# Patient Record
Sex: Female | Born: 1950 | Race: White | Hispanic: No | Smoking: Never smoker
Health system: Southern US, Community
[De-identification: ages and names within clinical notes are randomized; demographics above are authoritative.]

## PROBLEM LIST (undated history)

## (undated) ENCOUNTER — Ambulatory Visit

## (undated) DIAGNOSIS — R600 Localized edema: Secondary | ICD-10-CM

## (undated) DIAGNOSIS — G4730 Sleep apnea, unspecified: Secondary | ICD-10-CM

## (undated) DIAGNOSIS — M545 Low back pain, unspecified: Secondary | ICD-10-CM

## (undated) DIAGNOSIS — M25559 Pain in unspecified hip: Secondary | ICD-10-CM

## (undated) DIAGNOSIS — K589 Irritable bowel syndrome without diarrhea: Secondary | ICD-10-CM

## (undated) DIAGNOSIS — E538 Deficiency of other specified B group vitamins: Secondary | ICD-10-CM

## (undated) DIAGNOSIS — E039 Hypothyroidism, unspecified: Secondary | ICD-10-CM

## (undated) DIAGNOSIS — Z91018 Allergy to other foods: Secondary | ICD-10-CM

## (undated) DIAGNOSIS — M2550 Pain in unspecified joint: Secondary | ICD-10-CM

## (undated) DIAGNOSIS — E079 Disorder of thyroid, unspecified: Secondary | ICD-10-CM

## (undated) DIAGNOSIS — M549 Dorsalgia, unspecified: Secondary | ICD-10-CM

## (undated) DIAGNOSIS — R7303 Prediabetes: Secondary | ICD-10-CM

## (undated) DIAGNOSIS — I10 Essential (primary) hypertension: Secondary | ICD-10-CM

## (undated) DIAGNOSIS — M1990 Unspecified osteoarthritis, unspecified site: Secondary | ICD-10-CM

## (undated) DIAGNOSIS — M542 Cervicalgia: Secondary | ICD-10-CM

## (undated) HISTORY — DX: Dorsalgia, unspecified: M54.9

## (undated) HISTORY — DX: Allergy to other foods: Z91.018

## (undated) HISTORY — PX: OVARIAN CYST REMOVAL: SHX89

## (undated) HISTORY — DX: Cervicalgia: M54.2

## (undated) HISTORY — DX: Hypothyroidism, unspecified: E03.9

## (undated) HISTORY — DX: Unspecified osteoarthritis, unspecified site: M19.90

## (undated) HISTORY — DX: Low back pain, unspecified: M54.50

## (undated) HISTORY — DX: Essential (primary) hypertension: I10

## (undated) HISTORY — PX: ABDOMINAL HYSTERECTOMY: SHX81

## (undated) HISTORY — DX: Pain in unspecified joint: M25.50

## (undated) HISTORY — DX: Prediabetes: R73.03

## (undated) HISTORY — DX: Deficiency of other specified B group vitamins: E53.8

## (undated) HISTORY — DX: Sleep apnea, unspecified: G47.30

## (undated) HISTORY — DX: Localized edema: R60.0

## (undated) HISTORY — DX: Disorder of thyroid, unspecified: E07.9

## (undated) HISTORY — DX: Irritable bowel syndrome, unspecified: K58.9

## (undated) HISTORY — DX: Pain in unspecified hip: M25.559

## (undated) HISTORY — PX: TONSILLECTOMY: SHX5217

---

## 1999-03-16 ENCOUNTER — Other Ambulatory Visit: Admission: RE | Admit: 1999-03-16 | Discharge: 1999-03-16 | Payer: Self-pay | Admitting: *Deleted

## 2000-04-25 ENCOUNTER — Ambulatory Visit (HOSPITAL_COMMUNITY): Admission: RE | Admit: 2000-04-25 | Discharge: 2000-04-25 | Payer: Self-pay | Admitting: *Deleted

## 2000-04-25 ENCOUNTER — Encounter: Payer: Self-pay | Admitting: *Deleted

## 2000-04-29 ENCOUNTER — Other Ambulatory Visit: Admission: RE | Admit: 2000-04-29 | Discharge: 2000-04-29 | Payer: Self-pay | Admitting: *Deleted

## 2001-04-28 ENCOUNTER — Other Ambulatory Visit: Admission: RE | Admit: 2001-04-28 | Discharge: 2001-04-28 | Payer: Self-pay | Admitting: *Deleted

## 2002-06-22 ENCOUNTER — Other Ambulatory Visit: Admission: RE | Admit: 2002-06-22 | Discharge: 2002-06-22 | Payer: Self-pay | Admitting: *Deleted

## 2003-07-13 ENCOUNTER — Other Ambulatory Visit: Admission: RE | Admit: 2003-07-13 | Discharge: 2003-07-13 | Payer: Self-pay | Admitting: *Deleted

## 2003-07-14 ENCOUNTER — Encounter: Payer: Self-pay | Admitting: *Deleted

## 2003-07-14 ENCOUNTER — Ambulatory Visit (HOSPITAL_COMMUNITY): Admission: RE | Admit: 2003-07-14 | Discharge: 2003-07-14 | Payer: Self-pay | Admitting: *Deleted

## 2003-11-07 ENCOUNTER — Encounter: Payer: Self-pay | Admitting: Emergency Medicine

## 2003-11-08 ENCOUNTER — Inpatient Hospital Stay (HOSPITAL_COMMUNITY): Admission: EM | Admit: 2003-11-08 | Discharge: 2003-11-09 | Payer: Self-pay | Admitting: Emergency Medicine

## 2003-11-09 ENCOUNTER — Encounter: Payer: Self-pay | Admitting: Internal Medicine

## 2004-07-19 ENCOUNTER — Other Ambulatory Visit: Admission: RE | Admit: 2004-07-19 | Discharge: 2004-07-19 | Payer: Self-pay | Admitting: *Deleted

## 2004-07-25 ENCOUNTER — Ambulatory Visit (HOSPITAL_COMMUNITY): Admission: RE | Admit: 2004-07-25 | Discharge: 2004-07-25 | Payer: Self-pay | Admitting: *Deleted

## 2005-08-06 ENCOUNTER — Other Ambulatory Visit: Admission: RE | Admit: 2005-08-06 | Discharge: 2005-08-06 | Payer: Self-pay | Admitting: *Deleted

## 2005-08-14 ENCOUNTER — Ambulatory Visit (HOSPITAL_COMMUNITY): Admission: RE | Admit: 2005-08-14 | Discharge: 2005-08-14 | Payer: Self-pay | Admitting: Family Medicine

## 2006-01-22 ENCOUNTER — Ambulatory Visit (HOSPITAL_COMMUNITY): Admission: RE | Admit: 2006-01-22 | Discharge: 2006-01-22 | Payer: Self-pay | Admitting: Family Medicine

## 2006-02-26 ENCOUNTER — Ambulatory Visit: Payer: Self-pay | Admitting: Cardiology

## 2006-03-01 ENCOUNTER — Ambulatory Visit: Payer: Self-pay | Admitting: Infectious Diseases

## 2006-03-14 ENCOUNTER — Ambulatory Visit: Payer: Self-pay

## 2006-03-18 ENCOUNTER — Ambulatory Visit: Payer: Self-pay | Admitting: Infectious Diseases

## 2006-04-02 ENCOUNTER — Ambulatory Visit: Payer: Self-pay | Admitting: Cardiology

## 2006-05-01 ENCOUNTER — Ambulatory Visit: Payer: Self-pay | Admitting: Cardiology

## 2006-11-06 ENCOUNTER — Other Ambulatory Visit: Admission: RE | Admit: 2006-11-06 | Discharge: 2006-11-06 | Payer: Self-pay | Admitting: *Deleted

## 2007-01-07 ENCOUNTER — Ambulatory Visit: Payer: Self-pay | Admitting: Cardiology

## 2008-02-24 ENCOUNTER — Other Ambulatory Visit: Admission: RE | Admit: 2008-02-24 | Discharge: 2008-02-24 | Payer: Self-pay | Admitting: *Deleted

## 2008-11-10 ENCOUNTER — Emergency Department (HOSPITAL_COMMUNITY): Admission: EM | Admit: 2008-11-10 | Discharge: 2008-11-11 | Payer: Self-pay | Admitting: Emergency Medicine

## 2009-07-05 ENCOUNTER — Telehealth: Payer: Self-pay | Admitting: Gastroenterology

## 2009-11-19 LAB — HM DEXA SCAN

## 2010-02-27 LAB — HM MAMMOGRAPHY

## 2010-05-06 ENCOUNTER — Ambulatory Visit: Payer: Self-pay | Admitting: Internal Medicine

## 2010-05-06 DIAGNOSIS — M009 Pyogenic arthritis, unspecified: Secondary | ICD-10-CM | POA: Insufficient documentation

## 2010-05-07 ENCOUNTER — Inpatient Hospital Stay (HOSPITAL_COMMUNITY): Admission: EM | Admit: 2010-05-07 | Discharge: 2010-05-14 | Payer: Self-pay | Admitting: Emergency Medicine

## 2010-05-07 ENCOUNTER — Ambulatory Visit: Payer: Self-pay | Admitting: Cardiology

## 2010-05-08 ENCOUNTER — Encounter (INDEPENDENT_AMBULATORY_CARE_PROVIDER_SITE_OTHER): Payer: Self-pay | Admitting: Internal Medicine

## 2010-05-08 ENCOUNTER — Ambulatory Visit: Payer: Self-pay | Admitting: Infectious Diseases

## 2010-05-22 ENCOUNTER — Encounter: Payer: Self-pay | Admitting: Infectious Diseases

## 2010-05-24 ENCOUNTER — Encounter: Payer: Self-pay | Admitting: Infectious Diseases

## 2010-06-01 ENCOUNTER — Telehealth: Payer: Self-pay | Admitting: Internal Medicine

## 2010-06-05 ENCOUNTER — Encounter: Payer: Self-pay | Admitting: Internal Medicine

## 2010-06-05 ENCOUNTER — Encounter: Payer: Self-pay | Admitting: Infectious Diseases

## 2010-06-12 ENCOUNTER — Ambulatory Visit: Payer: Self-pay | Admitting: Infectious Diseases

## 2010-06-12 DIAGNOSIS — E039 Hypothyroidism, unspecified: Secondary | ICD-10-CM | POA: Insufficient documentation

## 2010-06-21 ENCOUNTER — Encounter: Admission: RE | Admit: 2010-06-21 | Discharge: 2010-09-19 | Payer: Self-pay | Admitting: Family Medicine

## 2010-07-25 LAB — HEMOGLOBIN A1C: Hgb A1c MFr Bld: 5.6 % (ref 4.0–6.0)

## 2010-12-10 ENCOUNTER — Encounter: Payer: Self-pay | Admitting: Family Medicine

## 2010-12-21 NOTE — Progress Notes (Signed)
Summary: IV ABX last dose 06/04/2010, Pull PICC 06/05/2010, HSFU 7/25  Phone Note Call from Patient Call back at Home Phone [phone]   Caller: Patient Call For: MD Reason for Call: Talk to Nurse, Talk to Doctor Action Taken: Phone Call Completed Summary of Call: MSSA bacteremia, hosp d/c 6/23/201.  Cefazolin x 21 days.  Last day 06/04/2010.  HSFU visit 06/12/2010.  Pt. wanting to know what to do about PICC.  Call to [facility] pharmacy.  Confirmed last day of ABX for 06/04/2010.  Page to Dr. Orvan Falconer, requesting advise. Jennet Maduro RN  June 01, 2010 2:49 PM Call back from Dr. Orvan Falconer.  Order to have PICC pulled after last dose of ABX on 06/04/2010.  Call to [facility], given order to have the PICC pulled.  Called pt with information.  Jennet Maduro RN  June 01, 2010 2:57 PM

## 2010-12-21 NOTE — Assessment & Plan Note (Signed)
Summary: new patient hsfu/TY   CC:  New patient HSFU.  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 She had PIC placed and was sent home on 05-14-10 to complete 4 weeks of ancef.  Occas has pain in her shoulder, aching. Has been seen by PT. No fevers or chills. Has occas, mild swelling in her shoulder. PIC taken out 06-05-10. Site has healed well except for some mild dermatitis.   Preventive Screening-Counseling & Management  Alcohol-Tobacco     Alcohol drinks/day: 0     Smoking Status: never  Caffeine-Diet-Exercise     Caffeine use/day: coffee and tea     Does Patient Exercise: yes     Type of exercise: PT  Safety-Violence-Falls     Seat Belt Use: yes   Updated Prior Medication List: ARMOUR THYROID 15 MG TABS (THYROID) Take 1 tablet by mouth every morning  Current Allergies (reviewed today): ! VANCOMYCIN ! EPINEPHRINE ! PCN ! * GLUTEN Past History:  Past Medical History: Hypothyroidism Metabolic Syndrome  Family History: CHF, sz d/o, COPD, prostate CA  Social History: Never Smoked Alcohol use-no married, retired Engineer, site.   Review of Systems       nl BM, nl urination, prev had thrush and yeast which has since improved. eating well, wt steady.   Vital Signs:  Patient profile:   60 year old female Height:      63 inches (160.02 cm) Weight:      218.8 pounds (99.45 kg) BMI:     38.90 Temp:     98.3 degrees F (36.83 degrees C) oral Pulse rate:   96 / minute BP sitting:   163 / 89  (left arm)  Vitals Entered By: Baxter Hire) (June 12, 2010 2:03 PM) CC: New patient HSFU Pain Assessment Patient in pain? yes     Location: right shoulder Intensity: 4 Type: aching Onset of pain  pain comes and goes Nutritional Status BMI of > 30 = obese Nutritional Status Detail appetite is okay per patient  Does patient need  assistance? Functional Status Self care Ambulation Normal   Physical Exam  General:  well-developed, well-nourished, and well-hydrated.   Eyes:  pupils equal, pupils round, and pupils reactive to light.   Mouth:  pharynx pink and moist and no exudates.   Neck:  no masses.   Lungs:  normal respiratory effort and normal breath sounds.   Heart:  normal rate, regular rhythm, and no murmur.   Abdomen:  soft, non-tender, and normal bowel sounds.   Extremities:  no LE edema.  R shoulder with some pain on abduction. no fluctuance, no crepitance. no increase heat.    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 Her TTE was negative in the hospital. She states she also had a TEE which was negative, report not seen in records. Encouraged her to improver her conditioning. Will recheck her BCx today, have her back as needed. If she has problems with her shoulder again, i would consider getting a MRI, CBC, ESR and CRP and BCx.  Orders: T-Culture, Blood Routine ([id number]) New Patient Level IV (91478) T-Culture, Blood Routine ([id number])  Medications Added to Medication List This Visit: 1)  Armour Thyroid 15 Mg Tabs (Thyroid) .... Take 1 tablet by mouth every morning

## 2010-12-21 NOTE — Miscellaneous (Signed)
Summary: [facility]: Orders  [facility]: Orders   Imported By: Florinda Marker 06/19/2010 09:59:45  _____________________________________________________________________  External Attachment:    Type:   Image     Comment:   External Document

## 2010-12-26 ENCOUNTER — Ambulatory Visit (INDEPENDENT_AMBULATORY_CARE_PROVIDER_SITE_OTHER): Payer: BC Managed Care – PPO | Admitting: Internal Medicine

## 2010-12-26 ENCOUNTER — Encounter: Payer: Self-pay | Admitting: Internal Medicine

## 2010-12-26 DIAGNOSIS — L259 Unspecified contact dermatitis, unspecified cause: Secondary | ICD-10-CM

## 2011-01-04 NOTE — Assessment & Plan Note (Signed)
Summary: RASH/VS PER TAMIKA   Vital Signs:  Patient profile:   60 year old female Height:      63 inches (160.02 cm) Weight:      229.5 pounds (104.32 kg) BMI:     40.80 Temp:     97.9 degrees F (36.61 degrees C) oral Pulse rate:   82 / minute BP sitting:   181 / 93  (left arm) Cuff size:   regular  Vitals Entered By: Jennet Maduro RN (December 26, 2010 3:41 PM) CC: rash on upper chest started 1.5 weeks ago.  Started when she was at the beach.  Seen 12/25/2010 by PCP.  Dxed as shingles.  Started on acyclovir and a cream yesterday. Is Patient Diabetic? No Pain Assessment Patient in pain? yes     Location: chest Intensity: 4 Type: burning Onset of pain  constant, worse at night Nutritional Status BMI of > 30 = obese Nutritional Status Detail appetite   Have you ever been in a relationship where you felt threatened, hurt or afraid?not asked today   Does patient need assistance? Functional Status Self care Ambulation Normal   CC:  rash on upper chest started 1.5 weeks ago.  Started when she was at the beach.  Seen 12/25/2010 by PCP.  Dxed as shingles.  Started on acyclovir and a cream yesterday.Marland Kitchen  History of Present Illness: Ms. Chrisha Vogel is a 60 year old who was seen by my partner, Dr. Enedina Finner, last summer when she had MSSA bacteremia and right shoulder infection.  An echocardiogram did not reveal any evidence of endocarditis.  She was treated with Ancef for 4 weeks and was cured.  Follow-up blood cultures on July 25 were negative.  About 1 1/2 weeks ago she noticed a red spot on her left upper chest.  She treated it with some tree oil topically and it seemed to improve but then over the last several days it worsened and the redness spread to the cleft between her breasts.  She saw Dr. Modesto Charon yesterday who told her that she probably had shingles.  She was given a prescription for oral acyclovir and topical mometasone. She was concerned that this might be related to some  problem with her immune system and/or that it might be a sign that the staph infection was returning to her she requested a visit today.  She has not had any fever, chills, or sweats.  Preventive Screening-Counseling & Management  Alcohol-Tobacco     Alcohol drinks/day: 0     Smoking Status: never  Caffeine-Diet-Exercise     Caffeine use/day: coffee and tea     Does Patient Exercise: yes     Type of exercise: PT  Safety-Violence-Falls     Seat Belt Use: yes  Current Medications (verified): 1)  Armour Thyroid 15 Mg Tabs (Thyroid) .... Take 1 Tablet By Mouth Every Morning 2)  Zovirax 800 Mg Tabs (Acyclovir) .... Take 1 Tablet By Mouth Three Times A Day 3)  Mometasone Furoate 0.1 % Crea (Mometasone Furoate) .... Apply To Rash  Allergies: 1)  ! Vancomycin 2)  ! Epinephrine 3)  ! Pcn 4)  ! * Gluten  Physical Exam  General:  alert and overweight-appearing.   Mouth:  good dentition and pharynx pink and moist.   Lungs:  normal breath sounds, no crackles, and no wheezes.   Heart:  normal rate, regular rhythm, and no murmur.   Skin:  there is a circular patch for maculopapular rash on her left upper  chest that is about 2 1/2 inches in diameter.  She has a similar appearing rash between her breasts that crosses over the midline.  There is one tiny vesicle on the right side.  No other skin lesions are noted on her trunk. Cervical Nodes:  no anterior cervical adenopathy and no posterior cervical adenopathy.   Axillary Nodes:  no R axillary adenopathy and no L axillary adenopathy.     Impression & Recommendations:  Problem # 1:  DERMATITIS (ICD-692.9) I am not certain what is causing her new dermatitis.  The fact that it has been waxing and waning over 1 1/2 weeks, the rash crosses the midline, and is not yet starting to crust would suggest that this may not be shingles.  However it is quite nonspecific and I think it is reasonable to treat her with acyclovir for that possibility as well  as topical steroids in case this is a contact dermatitis.  She does not know of any new lotions, soaps, perfumes or jewelry that she might have been exposed to.  I did not see any reason to believe that she has some underlying problem with her immune system.  And this certainly is unlikely to be due to any relapse of staph infection.  Her updated medication list for this problem includes:    Mometasone Furoate 0.1 % Crea (Mometasone furoate) .Marland Kitchen... Apply to rash  Orders: Est. Patient Level III (16109)  Medications Added to Medication List This Visit: 1)  Zovirax 800 Mg Tabs (Acyclovir) .... Take 1 tablet by mouth three times a day 2)  Mometasone Furoate 0.1 % Crea (Mometasone furoate) .... Apply to rash  Patient Instructions: 1)  Please call me next week.   Orders Added: 1)  Est. Patient Level III [60454]

## 2011-01-18 ENCOUNTER — Encounter: Payer: Self-pay | Admitting: Cardiology

## 2011-01-27 ENCOUNTER — Encounter: Payer: Self-pay | Admitting: *Deleted

## 2011-02-04 LAB — DIFFERENTIAL
Basophils Absolute: 0 10*3/uL (ref 0.0–0.1)
Basophils Absolute: 0 10*3/uL (ref 0.0–0.1)
Basophils Absolute: 0 10*3/uL (ref 0.0–0.1)
Basophils Absolute: 0 10*3/uL (ref 0.0–0.1)
Basophils Absolute: 0 10*3/uL (ref 0.0–0.1)
Basophils Relative: 0 % (ref 0–1)
Basophils Relative: 0 % (ref 0–1)
Eosinophils Absolute: 0 10*3/uL (ref 0.0–0.7)
Eosinophils Absolute: 0.1 10*3/uL (ref 0.0–0.7)
Eosinophils Relative: 0 % (ref 0–5)
Eosinophils Relative: 1 % (ref 0–5)
Lymphocytes Relative: 2 % — ABNORMAL LOW (ref 12–46)
Lymphocytes Relative: 3 % — ABNORMAL LOW (ref 12–46)
Lymphocytes Relative: 4 % — ABNORMAL LOW (ref 12–46)
Lymphocytes Relative: 7 % — ABNORMAL LOW (ref 12–46)
Lymphs Abs: 0.5 10*3/uL — ABNORMAL LOW (ref 0.7–4.0)
Lymphs Abs: 0.6 10*3/uL — ABNORMAL LOW (ref 0.7–4.0)
Lymphs Abs: 0.7 10*3/uL (ref 0.7–4.0)
Lymphs Abs: 1.1 10*3/uL (ref 0.7–4.0)
Lymphs Abs: 1.2 10*3/uL (ref 0.7–4.0)
Monocytes Absolute: 0.5 10*3/uL (ref 0.1–1.0)
Monocytes Absolute: 0.9 10*3/uL (ref 0.1–1.0)
Monocytes Absolute: 1.1 10*3/uL — ABNORMAL HIGH (ref 0.1–1.0)
Monocytes Relative: 6 % (ref 3–12)
Monocytes Relative: 7 % (ref 3–12)
Monocytes Relative: 8 % (ref 3–12)
Neutro Abs: 13.1 10*3/uL — ABNORMAL HIGH (ref 1.7–7.7)
Neutro Abs: 13.4 10*3/uL — ABNORMAL HIGH (ref 1.7–7.7)
Neutro Abs: 13.8 10*3/uL — ABNORMAL HIGH (ref 1.7–7.7)
Neutro Abs: 27.6 10*3/uL — ABNORMAL HIGH (ref 1.7–7.7)
WBC Morphology: INCREASED

## 2011-02-04 LAB — COMPREHENSIVE METABOLIC PANEL
ALT: 16 U/L (ref 0–35)
AST: 17 U/L (ref 0–37)
Albumin: 2.5 g/dL — ABNORMAL LOW (ref 3.5–5.2)
Albumin: 3.2 g/dL — ABNORMAL LOW (ref 3.5–5.2)
Alkaline Phosphatase: 54 U/L (ref 39–117)
BUN: 6 mg/dL (ref 6–23)
BUN: 6 mg/dL (ref 6–23)
CO2: 25 mEq/L (ref 19–32)
CO2: 25 mEq/L (ref 19–32)
Calcium: 8.1 mg/dL — ABNORMAL LOW (ref 8.4–10.5)
Calcium: 8.3 mg/dL — ABNORMAL LOW (ref 8.4–10.5)
Chloride: 104 mEq/L (ref 96–112)
Chloride: 107 mEq/L (ref 96–112)
Chloride: 109 mEq/L (ref 96–112)
Creatinine, Ser: 0.52 mg/dL (ref 0.4–1.2)
Creatinine, Ser: 0.59 mg/dL (ref 0.4–1.2)
GFR calc Af Amer: >60 mL/min (ref >60)
GFR calc Af Amer: >60 mL/min (ref >60)
GFR calc non Af Amer: >60 mL/min (ref >60)
GFR calc non Af Amer: >60 mL/min (ref >60)
Glucose, Bld: 151 mg/dL — ABNORMAL HIGH (ref 70–99)
Potassium: 4.2 mEq/L (ref 3.5–5.1)
Sodium: 136 mEq/L (ref 135–145)
Total Bilirubin: 0.9 mg/dL (ref 0.3–1.2)
Total Bilirubin: 0.9 mg/dL (ref 0.3–1.2)

## 2011-02-04 LAB — GLUCOSE, CAPILLARY
Glucose-Capillary: 102 mg/dL — ABNORMAL HIGH (ref 70–99)
Glucose-Capillary: 110 mg/dL — ABNORMAL HIGH (ref 70–99)
Glucose-Capillary: 112 mg/dL — ABNORMAL HIGH (ref 70–99)
Glucose-Capillary: 118 mg/dL — ABNORMAL HIGH (ref 70–99)
Glucose-Capillary: 126 mg/dL — ABNORMAL HIGH (ref 70–99)
Glucose-Capillary: 132 mg/dL — ABNORMAL HIGH (ref 70–99)
Glucose-Capillary: 134 mg/dL — ABNORMAL HIGH (ref 70–99)
Glucose-Capillary: 134 mg/dL — ABNORMAL HIGH (ref 70–99)
Glucose-Capillary: 135 mg/dL — ABNORMAL HIGH (ref 70–99)
Glucose-Capillary: 139 mg/dL — ABNORMAL HIGH (ref 70–99)
Glucose-Capillary: 140 mg/dL — ABNORMAL HIGH (ref 70–99)
Glucose-Capillary: 142 mg/dL — ABNORMAL HIGH (ref 70–99)
Glucose-Capillary: 146 mg/dL — ABNORMAL HIGH (ref 70–99)
Glucose-Capillary: 159 mg/dL — ABNORMAL HIGH (ref 70–99)
Glucose-Capillary: 164 mg/dL — ABNORMAL HIGH (ref 70–99)
Glucose-Capillary: 167 mg/dL — ABNORMAL HIGH (ref 70–99)
Glucose-Capillary: 167 mg/dL — ABNORMAL HIGH (ref 70–99)
Glucose-Capillary: 172 mg/dL — ABNORMAL HIGH (ref 70–99)
Glucose-Capillary: 173 mg/dL — ABNORMAL HIGH (ref 70–99)
Glucose-Capillary: 86 mg/dL (ref 70–99)
Glucose-Capillary: 92 mg/dL (ref 70–99)

## 2011-02-04 LAB — CBC
HCT: 30.4 % — ABNORMAL LOW (ref 36.0–46.0)
HCT: 30.5 % — ABNORMAL LOW (ref 36.0–46.0)
HCT: 32.6 % — ABNORMAL LOW (ref 36.0–46.0)
HCT: 33.8 % — ABNORMAL LOW (ref 36.0–46.0)
HCT: 43.1 % (ref 36.0–46.0)
Hemoglobin: 10.1 g/dL — ABNORMAL LOW (ref 12.0–15.0)
Hemoglobin: 10.1 g/dL — ABNORMAL LOW (ref 12.0–15.0)
Hemoglobin: 10.7 g/dL — ABNORMAL LOW (ref 12.0–15.0)
Hemoglobin: 11 g/dL — ABNORMAL LOW (ref 12.0–15.0)
Hemoglobin: 11.2 g/dL — ABNORMAL LOW (ref 12.0–15.0)
MCH: 27.7 pg (ref 26.0–34.0)
MCH: 28.1 pg (ref 26.0–34.0)
MCHC: 32.6 g/dL (ref 30.0–36.0)
MCHC: 33.2 g/dL (ref 30.0–36.0)
MCHC: 33.2 g/dL (ref 30.0–36.0)
MCHC: 33.2 g/dL (ref 30.0–36.0)
MCV: 83.5 fL (ref 78.0–100.0)
MCV: 83.9 fL (ref 78.0–100.0)
MCV: 84.9 fL (ref 78.0–100.0)
MCV: 85.3 fL (ref 78.0–100.0)
MCV: 85.3 fL (ref 78.0–100.0)
Platelets: 172 10*3/uL (ref 150–400)
Platelets: 223 10*3/uL (ref 150–400)
Platelets: 334 10*3/uL (ref 150–400)
RBC: 3.56 MIL/uL — ABNORMAL LOW (ref 3.87–5.11)
RBC: 3.91 MIL/uL (ref 3.87–5.11)
RBC: 3.98 MIL/uL (ref 3.87–5.11)
RBC: 4.33 MIL/uL (ref 3.87–5.11)
RDW: 13.4 % (ref 11.5–15.5)
RDW: 13.4 % (ref 11.5–15.5)
RDW: 14 % (ref 11.5–15.5)
WBC: 12.6 10*3/uL — ABNORMAL HIGH (ref 4.0–10.5)
WBC: 14.4 10*3/uL — ABNORMAL HIGH (ref 4.0–10.5)
WBC: 15.4 10*3/uL — ABNORMAL HIGH (ref 4.0–10.5)
WBC: 15.4 10*3/uL — ABNORMAL HIGH (ref 4.0–10.5)
WBC: 30 10*3/uL — ABNORMAL HIGH (ref 4.0–10.5)

## 2011-02-04 LAB — URINE CULTURE
Colony Count: NO GROWTH
Culture: NO GROWTH

## 2011-02-04 LAB — OVA AND PARASITE EXAMINATION

## 2011-02-04 LAB — FECAL LACTOFERRIN, QUANT

## 2011-02-04 LAB — STOOL CULTURE

## 2011-02-04 LAB — CARDIAC PANEL(CRET KIN+CKTOT+MB+TROPI)
CK, MB: 1.3 ng/mL (ref 0.3–4.0)
Relative Index: INVALID (ref 0.0–2.5)
Total CK: 66 U/L (ref 7–177)

## 2011-02-04 LAB — CULTURE, BLOOD (ROUTINE X 2): Culture: NO GROWTH

## 2011-02-04 LAB — BLOOD GAS, ARTERIAL
Acid-Base Excess: 1.5 mmol/L (ref 0.0–2.0)
Acid-base deficit: 0.9 mmol/L (ref 0.0–2.0)
Bicarbonate: 22.7 mEq/L (ref 20.0–24.0)
Drawn by: 328211
O2 Content: 3 L/min
O2 Saturation: 95.7 %
Patient temperature: 98.6
TCO2: 20.7 mmol/L (ref 0–100)
pCO2 arterial: 36.6 mmHg (ref 35.0–45.0)
pH, Arterial: 7.446 — ABNORMAL HIGH (ref 7.350–7.400)

## 2011-02-04 LAB — BASIC METABOLIC PANEL
BUN: 10 mg/dL (ref 6–23)
BUN: 8 mg/dL (ref 6–23)
BUN: 9 mg/dL (ref 6–23)
CO2: 27 mEq/L (ref 19–32)
Calcium: 8.2 mg/dL — ABNORMAL LOW (ref 8.4–10.5)
Calcium: 8.4 mg/dL (ref 8.4–10.5)
Chloride: 102 mEq/L (ref 96–112)
Chloride: 103 mEq/L (ref 96–112)
GFR calc Af Amer: >60 mL/min (ref >60)
GFR calc Af Amer: >60 mL/min (ref >60)
GFR calc non Af Amer: >60 mL/min (ref >60)
GFR calc non Af Amer: >60 mL/min (ref >60)
GFR calc non Af Amer: >60 mL/min (ref >60)
GFR calc non Af Amer: >60 mL/min (ref >60)
Glucose, Bld: 106 mg/dL — ABNORMAL HIGH (ref 70–99)
Glucose, Bld: 132 mg/dL — ABNORMAL HIGH (ref 70–99)
Potassium: 3.2 mEq/L — ABNORMAL LOW (ref 3.5–5.1)
Potassium: 3.7 mEq/L (ref 3.5–5.1)
Potassium: 3.7 mEq/L (ref 3.5–5.1)
Potassium: 4.2 mEq/L (ref 3.5–5.1)
Sodium: 137 mEq/L (ref 135–145)
Sodium: 140 mEq/L (ref 135–145)
Sodium: 141 mEq/L (ref 135–145)
Sodium: 143 mEq/L (ref 135–145)

## 2011-02-04 LAB — MAGNESIUM
Magnesium: 1.9 mg/dL (ref 1.5–2.5)
Magnesium: 2.3 mg/dL (ref 1.5–2.5)

## 2011-02-04 LAB — PROTIME-INR
INR: 1.26 (ref 0.00–1.49)
Prothrombin Time: 15.7 seconds — ABNORMAL HIGH (ref 11.6–15.2)

## 2011-02-04 LAB — URINALYSIS, MICROSCOPIC ONLY
Nitrite: NEGATIVE
Protein, ur: NEGATIVE mg/dL
Specific Gravity, Urine: 1.012 (ref 1.005–1.030)
Urobilinogen, UA: 0.2 mg/dL (ref 0.0–1.0)

## 2011-02-04 LAB — POCT CARDIAC MARKERS
CKMB, poc: <1 ng/mL — ABNORMAL LOW (ref 1.0–8.0)
CKMB, poc: <1 ng/mL — ABNORMAL LOW (ref 1.0–8.0)
Myoglobin, poc: 54.4 ng/mL (ref 12–200)
Troponin i, poc: <0.05 ng/mL (ref 0.00–0.09)

## 2011-02-04 LAB — URINALYSIS, ROUTINE W REFLEX MICROSCOPIC
Bilirubin Urine: NEGATIVE
Ketones, ur: NEGATIVE mg/dL
pH: 6 (ref 5.0–8.0)

## 2011-02-04 LAB — HEPATIC FUNCTION PANEL
AST: 25 U/L (ref 0–37)
Bilirubin, Direct: 0.2 mg/dL (ref 0.0–0.3)
Indirect Bilirubin: 0.5 mg/dL (ref 0.3–0.9)

## 2011-02-04 LAB — BRAIN NATRIURETIC PEPTIDE: Pro B Natriuretic peptide (BNP): 103 pg/mL — ABNORMAL HIGH (ref 0.0–100.0)

## 2011-02-04 LAB — CLOSTRIDIUM DIFFICILE EIA

## 2011-02-04 LAB — CORTISOL-AM, BLOOD: Cortisol - AM: 10.7 ug/dL (ref 4.3–22.4)

## 2011-02-04 LAB — PROCALCITONIN: Procalcitonin: <0.5 ng/mL

## 2011-02-04 LAB — LEGIONELLA ANTIGEN, URINE

## 2011-02-08 ENCOUNTER — Encounter: Payer: Self-pay | Admitting: *Deleted

## 2011-02-14 DIAGNOSIS — I10 Essential (primary) hypertension: Secondary | ICD-10-CM

## 2011-02-14 DIAGNOSIS — K589 Irritable bowel syndrome without diarrhea: Secondary | ICD-10-CM

## 2011-03-06 ENCOUNTER — Telehealth: Payer: Self-pay | Admitting: Gastroenterology

## 2011-03-06 NOTE — Telephone Encounter (Signed)
Allison Bell with Dr Christell Constant wanted to know when pt is due for a Recall COLON. Informed her our records show her last COLON was 03/03/2003 which means she will be due in 2014.

## 2011-03-07 ENCOUNTER — Ambulatory Visit (INDEPENDENT_AMBULATORY_CARE_PROVIDER_SITE_OTHER): Payer: BC Managed Care – PPO | Admitting: Cardiology

## 2011-03-07 ENCOUNTER — Encounter: Payer: Self-pay | Admitting: Cardiology

## 2011-03-07 DIAGNOSIS — R011 Cardiac murmur, unspecified: Secondary | ICD-10-CM | POA: Insufficient documentation

## 2011-03-07 DIAGNOSIS — I10 Essential (primary) hypertension: Secondary | ICD-10-CM

## 2011-03-07 DIAGNOSIS — R9431 Abnormal electrocardiogram [ECG] [EKG]: Secondary | ICD-10-CM

## 2011-03-07 NOTE — Assessment & Plan Note (Signed)
The patient has a mildly abnormal EKG. At this point there are no symptoms consistent with ischemia. No further cardiovascular testing is suggested. She should continue with progressing from her recent illness and let me know if she has any symptoms at which point I would consider screening treadmill test.

## 2011-03-07 NOTE — Progress Notes (Signed)
HPI And for evaluation of her cardiovascular risk factors. She has a history of palpitations. She's had stress tests in the past. She's had no documented coronary disease. Last year she has severe infection and what sounds like an allergic reaction to vancomycin. She was in the hospital and in the ICU. She has made a slow recovery from all of this. She had a slightly abnormal EKG with some inferior T-wave inversions recently and was sent back here for further evaluation. She is doing some walking 4-5 times per week. With this she has no chest pressure, neck or arm discomfort. She is not having any palpitations, presyncope or syncope. She has not had any new shortness of breath, PND or orthopnea. She has had no weight gain or edema. Note during her hospitalization she did have transthoracic and transesophageal echo with no significant abnormalities identified.  Allergies  Allergen Reactions  . Epinephrine     REACTION: heart pounds and races  . Penicillins     REACTION: swelling and rash  . Vancomycin     REACTION: head throbbing,heart raced, wave of heat throughout body    Current Outpatient Prescriptions  Medication Sig Dispense Refill  . FREESTYLE LITE test strip       . Lancets Misc. MISC by Does not apply route.        . thyroid (ARMOUR) 30 MG tablet Take 30 mg by mouth daily. 1 tab am and 1/2 tab pm       . DISCONTD: thyroid (ARMOUR) 15 MG tablet Take 30 mg by mouth every morning.       Marland Kitchen DISCONTD: acyclovir (ZOVIRAX) 800 MG tablet Take 800 mg by mouth 3 (three) times daily.        Marland Kitchen DISCONTD: mometasone (ELOCON) 0.1 % cream Apply to rash         Past Medical History  Diagnosis Date  . IBS (irritable bowel syndrome)   . Hypertension     Past Surgical History  Procedure Date  . Abdominal hysterectomy   . Ovarian cyst removal   . Cesarean section    FH:  No early CAD.  Positive CHF  History   Social History  . Marital Status: Married    Spouse Name: N/A    Number of  Children: N/A  . Years of Education: N/A   Occupational History  . Not on file.   Social History Main Topics  . Smoking status: Never Smoker   . Smokeless tobacco: Not on file  . Alcohol Use: Not on file  . Drug Use: Not on file  . Sexually Active: Not on file   Other Topics Concern  . Not on file   Social History Narrative  . No narrative on file   ROS:  As stated in the HPI and negative for all other systems.  PHYSICAL EXAM BP 140/76  Pulse 80  Ht 5\' 2"  (1.575 m)  Wt 228 lb (103.42 kg)  BMI 41.70 kg/m2 GENERAL:  Well appearing HEENT:  Pupils equal round and reactive, fundi not visualized, oral mucosa unremarkable NECK:  No jugular venous distention, waveform within normal limits, carotid upstroke brisk and symmetric, no bruits, no thyromegaly LYMPHATICS:  No cervical, inguinal adenopathy LUNGS:  Clear to auscultation bilaterally BACK:  No CVA tenderness CHEST:  Unremarkable HEART:  PMI not displaced or sustained,S1 and S2 within normal limits, no S3, no S4, no clicks, no rubs, no murmurs ABD:  Flat, positive bowel sounds normal in frequency in pitch, no bruits,  no rebound, no guarding, no midline pulsatile mass, no hepatomegaly, no splenomegaly EXT:  2 plus pulses throughout, no edema, no cyanosis no clubbing SKIN:  No rashes no nodules NEURO:  Cranial nerves II through XII grossly intact, motor grossly intact throughout PSYCH:  Cognitively intact, oriented to person place and time  EKG:  Sinus rhythm, rate 99, axis within normal limits, QT interval is slightly prolonged, nonspecific T-wave flattening  ASSESSMENT AND PLAN

## 2011-03-07 NOTE — Assessment & Plan Note (Signed)
She understands the need to lose weight and she is actively dissipating this.

## 2011-03-07 NOTE — Assessment & Plan Note (Signed)
She has a known heart murmur. However, she's had echocardiograms. No change in therapy is indicated for further evaluation. She will be followed clinically for this.

## 2011-03-07 NOTE — Patient Instructions (Signed)
1 year with Dr Antoine Poche in [place] Continue current medications

## 2011-03-07 NOTE — Assessment & Plan Note (Signed)
The blood pressure is at target. No change in medications is indicated. We will continue with therapeutic lifestyle changes (TLC).  

## 2011-04-06 NOTE — Assessment & Plan Note (Signed)
[facility]                            CARDIOLOGY OFFICE NOTE   Allison Bell, Allison Bell                    MRN:          [id number]  DATE:01/07/2007                            DOB:          1951-07-29    Primary Dr. Ernestina Penna.   REASON FOR PRESENTATION:  Evaluated patient with palpitations.   HISTORY OF PRESENT ILLNESS:  The patient was referred back to my clinic  from Dr. Christell Constant.  She presented on February 16 with not feeling well.  She felt nauseated and thought that she was dehydrated.  She was noted  to have rapid heart rate with a sinus tachycardia, approximately 100  beats per minute.  The patient is always quite anxious.  She does not  describe any overt symptoms such as substernal chest discomfort.  She  gets some fleating atypical pain that she has a difficult time  quantifying and qualifying.  She has some atypical neck and arm  discomfort.  She does not have any presyncope or syncope.  She thinks  she gets panicked and her heart rate starts to go up.  She has had  extensive evaluations in the past to include, event monitors  demonstrating sinus tachycardia.  She has stress most recently in 2004  with no evidence of ischemia.  She has had a echocardiogram in 2007  demonstrating no significant abnormalities.  She has had no change in  her symptoms since then.   PAST MEDICAL HISTORY:  Hypothyroidism, hyperlipidemia, diabetes diet  controlled, obesity, tonsillectomy, C-section x 2, hysterectomy, carpal  tunnel surgery on her right wrist.   ALLERGIES:  PENICILLIN.   MEDICATIONS:  1. Armor thyroid.  2. CoE Q.  3. Olive oil.  4. Probiotic.  5. Magnesium complete.   REVIEW OF SYSTEMS:  As stated in the history of present illness,  otherwise negative for other systems.   PHYSICAL EXAMINATION:  The patient is no distress.  Blood pressure 167/89, heart rate 82 and regular, weight 242 pounds,  body mass index 44.  HEENT:  Eyes  unremarkable, pupils equal, round and reactive to light,  fundi not visualized.  NECK:  No jugular venous distension, wave form within normal limits,  carotid upstroke brisk and symmetric, no bruits, no thyromegaly.  LYMPHATICS:  No cervical, axillary or inguinal adenopathy.  LUNGS:  Clear to auscultation bilaterally.  BACK:  No costal tibial tenderness.  CHEST:  Unremarkable.  HEART:  PMI not displaced or sustained, S1 and S2 within normal limits,  no S3, no S4, no clicks, no rubs, no murmurs.  ABDOMEN:  Obese, positive bowel sounds regular in frequency and pitch,  no bruits, no rebound, no guarding in the midline, no  hepatosplenomegaly.  SKIN:  No rashes, no bruises.  EXTREMITIES:  Have 2+ pulses throughout, no edema, no cyanosis or  clubbing.  NEURO:  Oriented to person, place and time, cranial nerves 2 through 12  grossly intact, motor grossly intact.   EKG, sinus rhythm, rate 82, axis within normal limits, intervals within  normal limits, no acute ST Q-wave changes.   ASSESSMENT/PLAN:  1. Palpitations, the patients  palpitations are secondary.  She has      sinus tachycardia when she gets anxious or otherwise has some other      medical problems.  I do not think she has any primary dysrhythmia      or primary cardiac problems.  At this point no further      cardiovascular testing is suggested.  2. Hypertension, blood pressure is elevated in the office, however she      gives me a blood pressure diary of morning blood pressures were it      is always in the 130s over 70s.  I have asked her to take these at      random times of the day to get a sampling of evening and afternoon      pressures.  This she can present to Dr. Christell Constant.  3. Follow up, she wants to come back in about 6 months, we will      arrange this.     Rollene Rotunda, MD, [facility]  Electronically Signed    JH/MedQ  DD: 01/07/2007  DT: 01/08/2007  Job #: [id number]   cc:   Ernestina Penna, M.D.

## 2011-04-06 NOTE — H&P (Signed)
NAME:  YASUKO, LAPAGE                       ACCOUNT NO.:  [id number]   MEDICAL RECORD NO.:  [id number]                   PATIENT TYPE:  INP   LOCATION:  2036                                 FACILITY:  [facility]   PHYSICIAN:  Charlton Haws, M.D.                  DATE OF BIRTH:  May 22, 1951   DATE OF ADMISSION:  11/08/2003  DATE OF DISCHARGE:                                HISTORY & PHYSICAL   HISTORY OF PRESENT ILLNESS:  Mrs. Abbasi is a 60 year old patient referred  to [facility] ER for feeling bad.   She has previously had a treadmill test by Dr. Rollene Rotunda which was  negative.  She is 60 years old.  She has non-insulin-dependent diabetes and  family history of coronary disease.  She had the flu in November.  At that  time, she took a trip to [place].  She apparently was seen by EMS at a hotel  room and was thought to be dehydrated.  She gets a presyncopal feeling with  shakiness and diaphoresis.  Her CBGs have not been low at this time, running  in the hundreds.  She occasionally feels palpitations and chest pressure.   Her mom had an MI in her 20s.  The patient has no previous history of  coronary artery disease.  She was seen at the [facility] ER last night and  then went to Dr. Suella Grove. Moore's office again today.  In talking to the  patient, she is obviously under a lot of stress.  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   She has been on thyroid medicine for many years and apparently this has been  checked recently and is normal.  The patient has not had a history of DVT.   ALLERGIES:  She is allergic to PENICILLIN.   MEDICATIONS:  She takes:  1. Avandia.  2. Metformin 2/500 mg nightly.  3. Armour Thyroid 60 g b.i.d.  4. Pravachol 40 mg a day, the Pravachol  she has been on for over a year and     is not temporally related to her weakness.   PAST MEDICAL HISTORY:  1. Her last hemoglobin A1c in September 2004 was 5.2.  2. She has irritable bowel syndrome.  3. History of TAH/BSO.  4. She had a colonoscopy in April of 2004 which was okay.   SOCIAL HISTORY:  She lives in [place].  She is married with two teenage  children; they are at [employer].  Again, they own a 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 which is a  cause of some stress.   EXAMINATION:  GENERAL:  On examination, she actually looks somewhat tearful.  VITAL SIGNS:  Blood pressure is 130/80; she is not postural.  Pulse is 80  and regular.  LUNGS:  Lungs are clear.  CARDIAC:  Carotids are normal.  There is an S1 and S2.  There is a systolic  ejection murmur.  ABDOMEN:  Abdomen is benign.  EXTREMITIES:  Distal pulses are intact with no edema.   LABORATORY AND ACCESSORY CLINICAL DATA:  EKG shows sinus rhythm with  nonspecific ST-T wave changes.   Initial chest x-ray shows no active disease.   Labs are pending.   IMPRESSION:  I am not sure what to make of the patient's symptoms.  Clearly,  there is some component of mental stress going on.  She is a diabetic and  needs a further cardiac workup.  We will check a 2-D echocardiogram to rule  out occult cardiomyopathy; she will then have an adenosine Cardiolite study  in the morning.   We will check her blood work.  She has not had any fevers or any other  localizing symptoms of occult infection.   Her diabetes seems to be well-controlled with no episodes of hypoglycemia.  We will recheck her TSH and T4.   I suspect if her monitor is normal and there is no evidence of  cardiomyopathy on echocardiogram and a negative stress test, she may be able  to be discharged later tomorrow.                                                Charlton Haws, M.D.    PN/MEDQ  D:  11/08/2003  T:  11/09/2003  Job:  [id number]   cc:   Ernestina Penna, M.D.  [address] [place]  [place] [address]  Fax: [phone]

## 2011-04-06 NOTE — Discharge Summary (Signed)
NAME:  Allison Bell, ALTMANN                       ACCOUNT NO.:  [id number]   MEDICAL RECORD NO.:  [id number]                   PATIENT TYPE:  INP   LOCATION:  2036                                 FACILITY:  [facility]   PHYSICIAN:  Pricilla Riffle, M.D.                 DATE OF BIRTH:  August 02, 1951   DATE OF ADMISSION:  11/08/2003  DATE OF DISCHARGE:  11/09/2003                           DISCHARGE SUMMARY - REFERRING   DISCHARGE DIAGNOSES:  1. Fatigue.  2. Chest pain.  3. Hypothyroidism, treated.  4. Diabetes mellitus, oral agent.  5. Irritable bowel syndrome.  6. Status post total abdominal hysterectomy.  7. Obesity.   HOSPITAL COURSE:  Ms. Polo is a 60 year old female who presents with a  history of a flu-like syndrome that began in November.  Since that time she  has felt bad as well as fatigued.  She has also noted palpitations as well  as chest pain.  She does have a strong family history of heart disease in  her mother who began having problems in her 78's.  This patient has no known  history of coronary artery disease.  She did see Dr. Antoine Poche last year at  the request of Dr. Christell Constant for an exercise treadmill test.  This was negative  per the patient and there was no further work up.  The patient saw the ER  physician at [facility] yesterday for the same symptoms and was referred  back to her primary care physician today.  At this point Dr. Christell Constant sent her  back to [facility] for admission for further work up.   The patient remained in the hospital over night.  Lab studies revealed white  count 12,100, hemoglobin 12.8, hematocrit 37.4, platelets 351,000.  Sodium  142, potassium 3.8, BUN 6, creatinine 0.6.  CK and troponin were negative.  TSH 1.242.  Adenosine Cardiolite reveals normal perfusion, ejection fraction  at 40% but an echocardiogram shows normal ejection fraction of 55 to 60%  with no valvular abnormalities.  At this point Dr. Tenny Craw felt the patient  would be ready for discharge  home and that she needed to follow up with Dr.  Christell Constant in one week.   She is to remain on a low fat, low cholesterol, diabetic diet.  She is to  continue her home medications.   DISCHARGE MEDICATIONS:  1. Avandamet 2/500 at h.s.  2. Thyroid 60 grains 2 daily.  3. Pravachol 40 mg at h.s.  4. Enteric coated aspirin 325 mg daily.  5. She may continue her magnesium, calcium and multivitamins.      Guy Franco, P.A. [facility]                      Pricilla Riffle, M.D.    LB/MEDQ  D:  01/18/2004  T:  01/19/2004  Job:  [id number]   cc:   Ernestina Penna, M.D.  [address]  [place]  [place] [address]  Fax: [phone]

## 2011-04-12 ENCOUNTER — Encounter: Payer: Self-pay | Admitting: Cardiology

## 2011-08-05 IMAGING — CT CT ABD-PELV W/ CM
2 of 5 series · 17 of 46 positions shown, 19 images · IV contrast (APPLIED)
Comparison: CT of the abdomen and pelvis 08/14/2005.

CLINICAL DATA: Right upper quadrant abdominal pain.  Nausea.

CT ABDOMEN AND PELVIS WITH CONTRAST
TECHNIQUE: Multidetector CT imaging of the abdomen and pelvis was
performed following the standard protocol during bolus
administration of intravenous contrast.
Contrast: 125 ml Omnipaque 300

[Series 2: abd_pel 5.0 b40f st · axial · 0.67mm/px · z∈[-476,-71]mm · 14 of 91 slices shown, 16 images]
[im 5/91  soft-tissue]
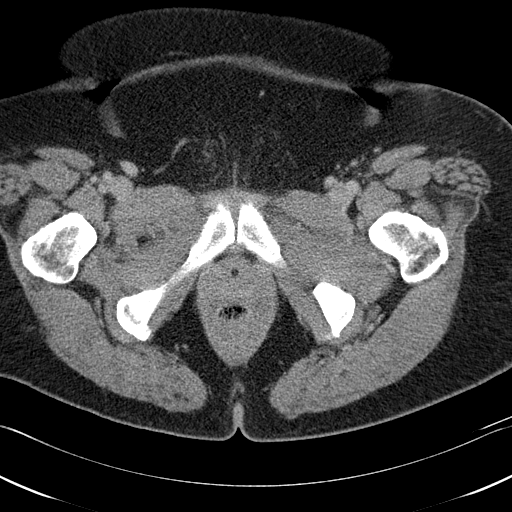
[im 5/91  bone]
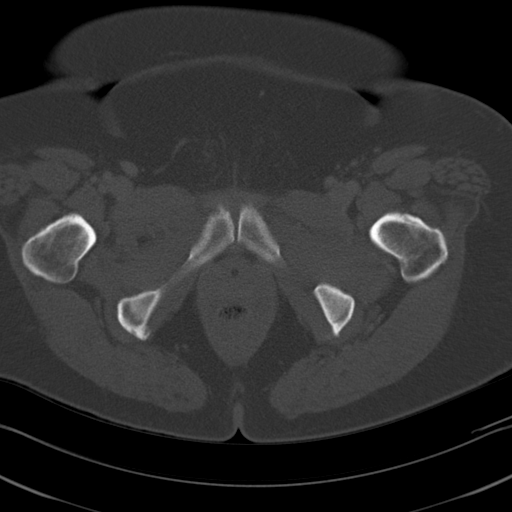
[im 10/91  soft-tissue]
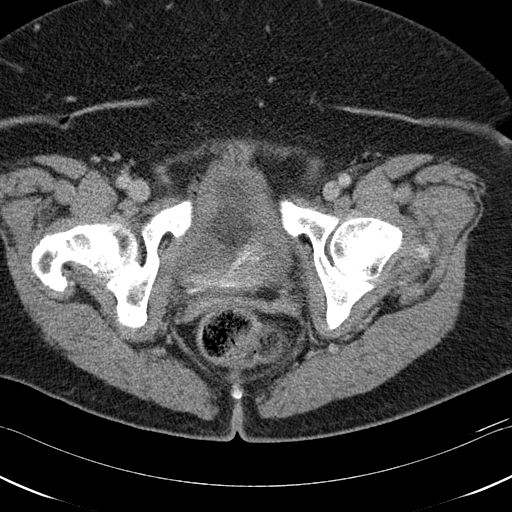
[im 19/91  soft-tissue]
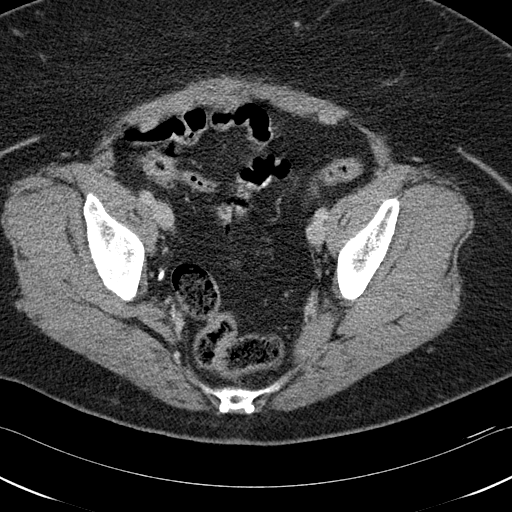
[im 24/91  soft-tissue]
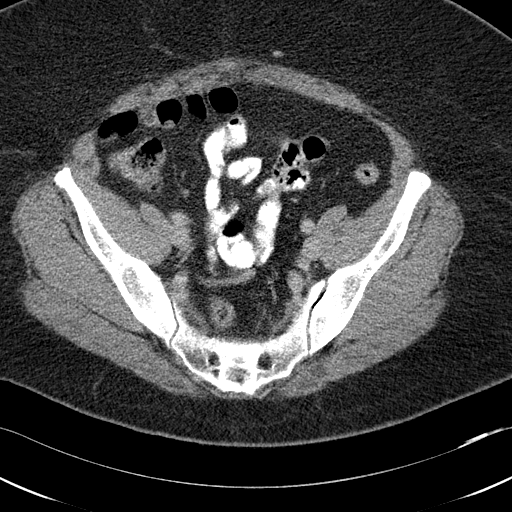
[im 29/91  soft-tissue]
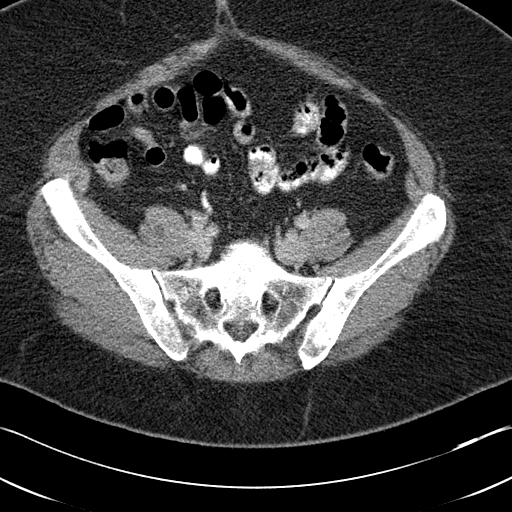
[im 38/91  soft-tissue]
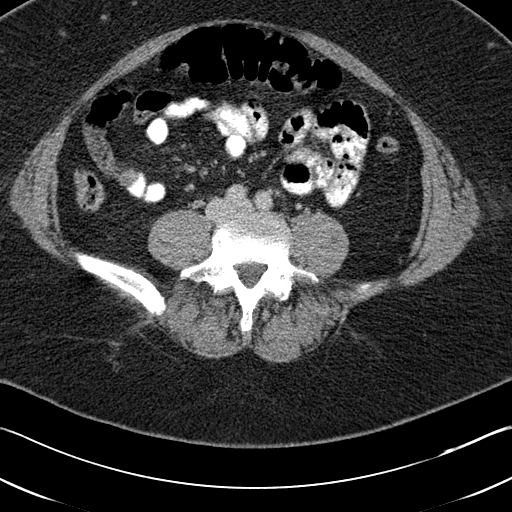
[im 43/91  soft-tissue]
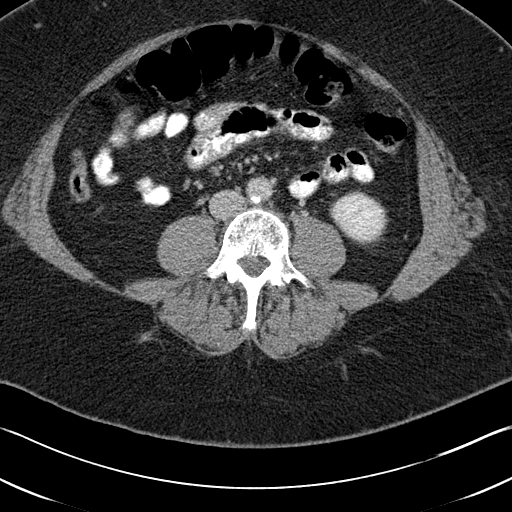
[im 48/91  soft-tissue]
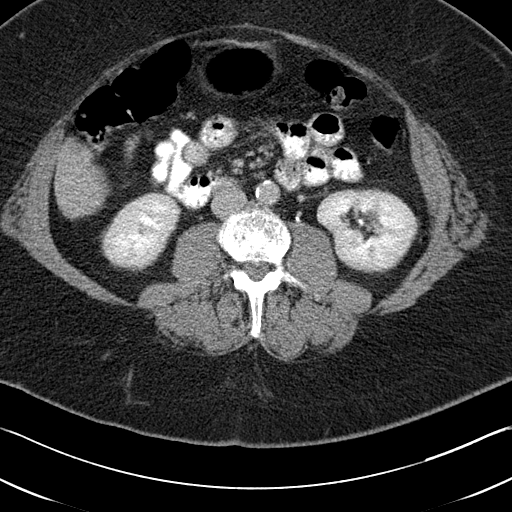
[im 53/91  soft-tissue]
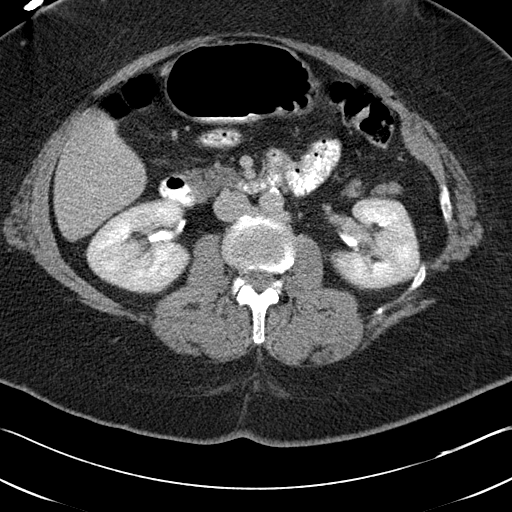
[im 53/91  bone]
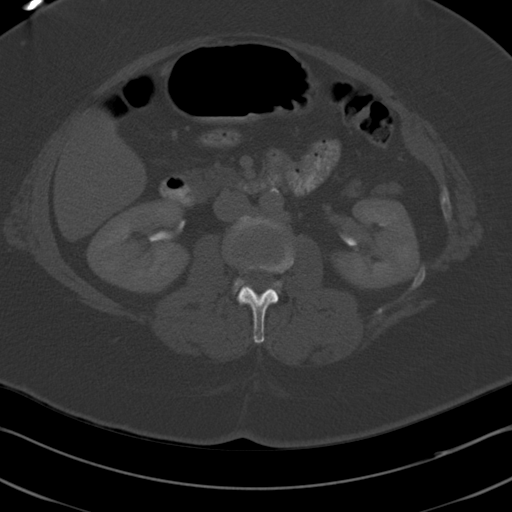
[im 62/91  soft-tissue]
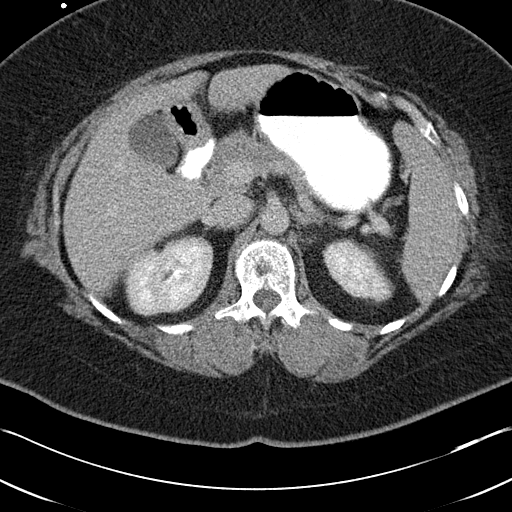
[im 67/91  soft-tissue]
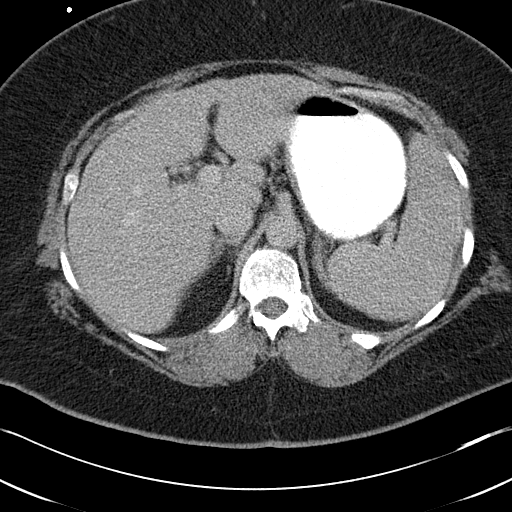
[im 72/91  soft-tissue]
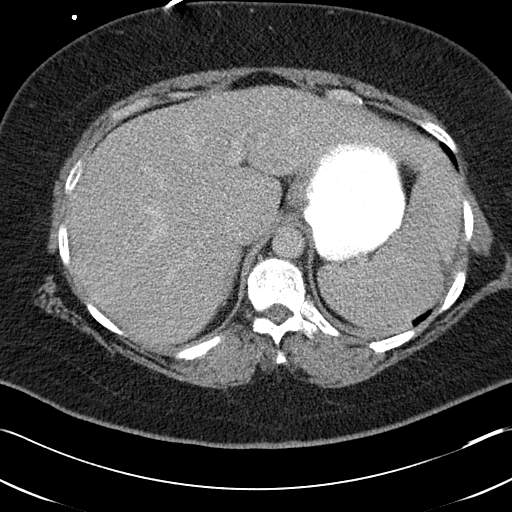
[im 81/91  soft-tissue]
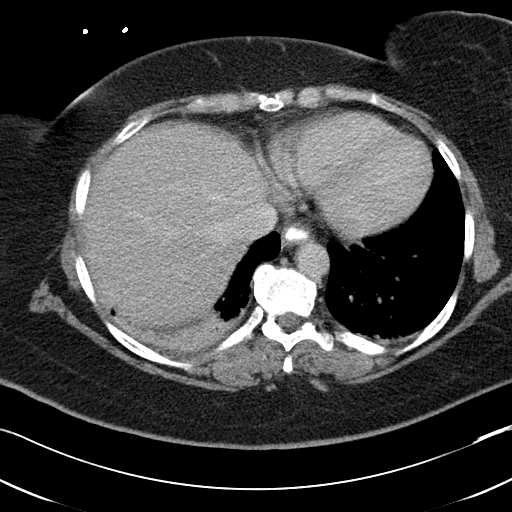
[im 86/91  soft-tissue]
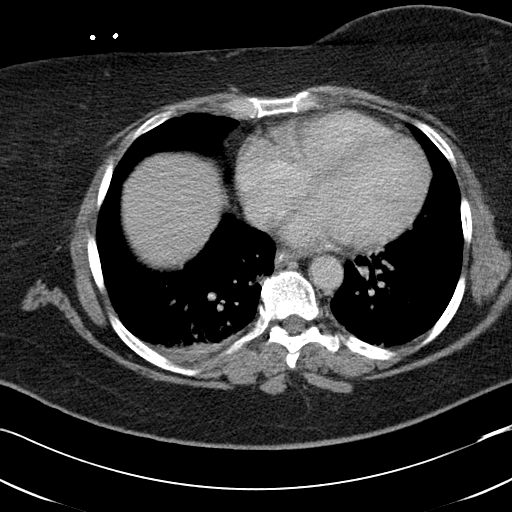

[Series 603: coronals · coronal · 0.92mm/px · 3 of 77 slices shown]
[im 26/77  soft-tissue]
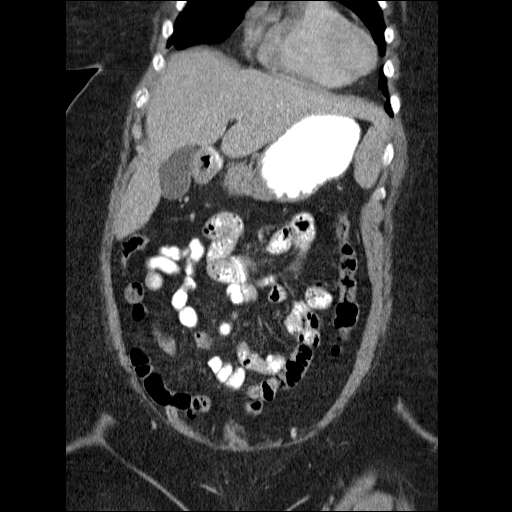
[im 34/77  soft-tissue]
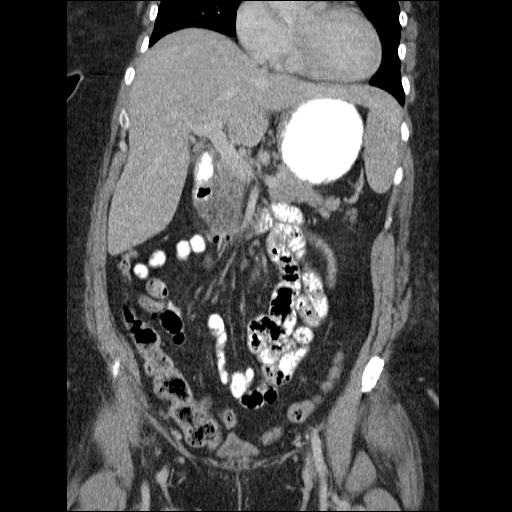
[im 43/77  soft-tissue]
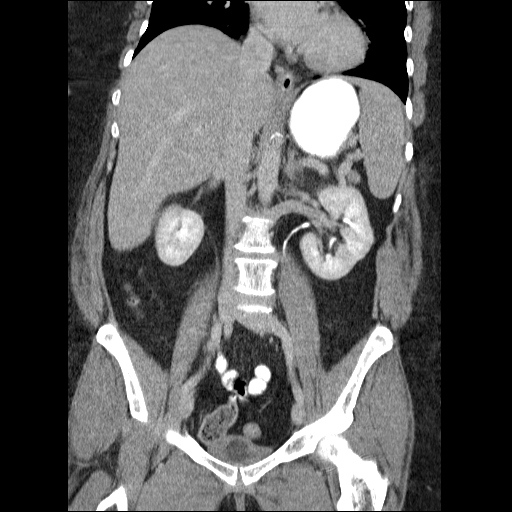

[17 of 46 positions shown; findings below may reference images not displayed]

FINDINGS: Areas of dependent airspace disease are present
bilaterally, more prominent on the right.  The heart size is
normal.  No significant pericardial effusion is present.  Note is
made of some contrast within the distal esophagus.

The liver is enlarged.  No focal lesions are evident.  It measures
22.7 cm cephalocaudad.  The spleen is unremarkable.  The stomach,
duodenum, and pancreas are within normal limits.  The common bile
duct and gallbladder are normal.  The adrenal glands are normal
bilaterally.  The kidneys are unremarkable.  Minimal
atherosclerotic calcifications are noted in the aorta without
aneurysm.

The rectosigmoid colon is partially collapsed, but within normal
limits.  The remainder of the colon is normal.  The appendix is
visualized and within normal limits.  The small bowel is
unremarkable.  A Foley catheter is present within the urinary
bladder.  The patient is status post hysterectomy.  The ovaries are
not visualized and may be surgically absent.

The bone windows are unremarkable.
IMPRESSION: 1.  Areas of dependent airspace disease bilaterally, worse on the
right.  Tiny bilateral pleural effusions are also present.  While
this likely represents atelectasis, early infection is also
considered.
2.  Atherosclerosis.
3.  A Foley catheter is present within the urinary bladder.
4.  Status post hysterectomy and probable bilateral oophorectomy.
5.  Hepatomegaly without focal lesion.

## 2011-08-24 LAB — URINALYSIS, ROUTINE W REFLEX MICROSCOPIC
Nitrite: NEGATIVE
Specific Gravity, Urine: 1.006 (ref 1.005–1.030)
Urobilinogen, UA: 0.2 mg/dL (ref 0.0–1.0)
pH: 7 (ref 5.0–8.0)

## 2011-08-24 LAB — GLUCOSE, CAPILLARY

## 2011-08-24 LAB — CBC
Hemoglobin: 14 g/dL (ref 12.0–15.0)
RBC: 4.97 MIL/uL (ref 3.87–5.11)
WBC: 14.4 10*3/uL — ABNORMAL HIGH (ref 4.0–10.5)

## 2011-08-24 LAB — COMPREHENSIVE METABOLIC PANEL
ALT: 25 U/L (ref 0–35)
AST: 37 U/L (ref 0–37)
Alkaline Phosphatase: 54 U/L (ref 39–117)
CO2: 25 mEq/L (ref 19–32)
Chloride: 104 mEq/L (ref 96–112)
GFR calc Af Amer: >60 mL/min (ref >60)
GFR calc non Af Amer: >60 mL/min (ref >60)
Glucose, Bld: 136 mg/dL — ABNORMAL HIGH (ref 70–99)
Potassium: 4 mEq/L (ref 3.5–5.1)
Sodium: 139 mEq/L (ref 135–145)
Total Bilirubin: 0.9 mg/dL (ref 0.3–1.2)

## 2011-08-24 LAB — DIFFERENTIAL
Monocytes Absolute: 0.6 10*3/uL (ref 0.1–1.0)
Monocytes Relative: 4 % (ref 3–12)
Neutrophils Relative %: 80 % — ABNORMAL HIGH (ref 43–77)

## 2011-09-11 ENCOUNTER — Encounter: Payer: Self-pay | Admitting: Cardiology

## 2012-09-02 ENCOUNTER — Encounter: Payer: Self-pay | Admitting: Cardiology

## 2013-03-09 ENCOUNTER — Ambulatory Visit: Payer: Self-pay | Admitting: Family Medicine

## 2013-04-01 ENCOUNTER — Ambulatory Visit: Payer: Self-pay | Admitting: Family Medicine

## 2013-04-30 ENCOUNTER — Encounter: Payer: Self-pay | Admitting: *Deleted

## 2013-05-25 ENCOUNTER — Ambulatory Visit: Payer: Self-pay | Admitting: Family Medicine

## 2013-06-01 ENCOUNTER — Telehealth: Payer: Self-pay | Admitting: Family Medicine

## 2013-06-01 ENCOUNTER — Ambulatory Visit (INDEPENDENT_AMBULATORY_CARE_PROVIDER_SITE_OTHER): Payer: BC Managed Care – PPO | Admitting: Family Medicine

## 2013-06-01 VITALS — BP 152/82 | HR 86 | Temp 97.0°F | Ht 62.5 in | Wt 216.4 lb

## 2013-06-01 DIAGNOSIS — J019 Acute sinusitis, unspecified: Secondary | ICD-10-CM | POA: Insufficient documentation

## 2013-06-01 DIAGNOSIS — E8881 Metabolic syndrome: Secondary | ICD-10-CM

## 2013-06-01 DIAGNOSIS — J029 Acute pharyngitis, unspecified: Secondary | ICD-10-CM | POA: Insufficient documentation

## 2013-06-01 DIAGNOSIS — E889 Metabolic disorder, unspecified: Secondary | ICD-10-CM | POA: Insufficient documentation

## 2013-06-01 DIAGNOSIS — R059 Cough, unspecified: Secondary | ICD-10-CM | POA: Insufficient documentation

## 2013-06-01 DIAGNOSIS — R05 Cough: Secondary | ICD-10-CM

## 2013-06-01 LAB — POCT RAPID STREP A (OFFICE): Rapid Strep A Screen: NEGATIVE

## 2013-06-01 MED ORDER — FLUTICASONE PROPIONATE 50 MCG/ACT NA SUSP: 2.0000 | Freq: Every day | NASAL | Status: DC

## 2013-06-01 MED ORDER — AZITHROMYCIN 500 MG PO TABS: ORAL_TABLET | ORAL | Status: DC

## 2013-06-01 NOTE — Patient Instructions (Signed)
      Dr Otto Felkins's Recommendations  Diet and Exercise discussed with patient.  For nutrition information, I recommend books:  1).Eat to Live by Dr Joel Fuhrman. 2).Prevent and Reverse Heart Disease by Dr Caldwell Esselstyn. 3) Dr Neal Barnard's Book:  Program to Reverse Diabetes  Exercise recommendations are:  If unable to walk, then the patient can exercise in a chair 3 times a day. By flapping arms like a bird gently and raising legs outwards to the front.  If ambulatory, the patient can go for walks for 30 minutes 3 times a week. Then increase the intensity and duration as tolerated.  Goal is to try to attain exercise frequency to 5 times a week.  If applicable: Best to perform resistance exercises (machines or weights) 2 days a week and cardio type exercises 3 days per week.  

## 2013-06-01 NOTE — Telephone Encounter (Signed)
Patient concerned that her URI is worsening.  Symptoms x 3 days.  She has had a staph infection in the past and is extra cautious when she gets sick now.    Appt scheduled for this afternoon with Dr. Modesto Charon.

## 2013-06-01 NOTE — Progress Notes (Signed)
Patient ID: Allison Bell, female   DOB: [birth year], 62 y.o.   MRN: [id number] SUBJECTIVE: CC: Chief Complaint  Patient presents with  . Cough    cough sore  throat    HPI: Started as a sorethroat. Then nasal congestion and pressure in the maxillary sinuses. Ears feel full and coughing. Has metabolic syndrome.  Past Medical History  Diagnosis Date  . IBS (irritable bowel syndrome)   . Hypertension    Past Surgical History  Procedure Laterality Date  . Abdominal hysterectomy    . Ovarian cyst removal    . Cesarean section     History   Social History  . Marital Status: Married    Spouse Name: N/A    Number of Children: N/A  . Years of Education: N/A   Occupational History  . Not on file.   Social History Main Topics  . Smoking status: Never Smoker   . Smokeless tobacco: Not on file  . Alcohol Use: Not on file  . Drug Use: Not on file  . Sexually Active: Not on file   Other Topics Concern  . Not on file   Social History Narrative  . No narrative on file   No family history on file. Current Outpatient Prescriptions on File Prior to Visit  Medication Sig Dispense Refill  . FREESTYLE LITE test strip       . Lancets Misc. MISC by Does not apply route.        . thyroid (ARMOUR) 30 MG tablet Take 30 mg by mouth daily. 1 tab am and 1/2 tab pm        No current facility-administered medications on file prior to visit.   Allergies  Allergen Reactions  . Epinephrine     REACTION: heart pounds and races  . Penicillins     REACTION: swelling and rash  . Vancomycin     REACTION: head throbbing,heart raced, wave of heat throughout body   Immunization History  Administered Date(s) Administered  . Tdap 02/18/2008   Prior to Admission medications   Medication Sig Start Date End Date Taking? Authorizing Provider  FREESTYLE LITE test strip  12/06/10  Yes Historical Provider, MD  Lancets Misc. MISC by Does not apply route.     Yes Historical Provider, MD  thyroid  (ARMOUR) 30 MG tablet Take 30 mg by mouth daily. 1 tab am and 1/2 tab pm    Yes Historical Provider, MD  azithromycin (ZITHROMAX Z-PAK) 500 MG tablet One daily for 3 days 06/01/13   Ileana Ladd, MD  fluticasone [facility]) 50 MCG/ACT nasal spray Place 2 sprays into the nose daily. 06/01/13   Ileana Ladd, MD     ROS: As above in the HPI. All other systems are stable or negative.  OBJECTIVE: APPEARANCE:  Patient in no acute distress.The patient appeared well nourished and normally developed. Acyanotic. Waist: VITAL SIGNS:BP 152/82  Pulse 86  Temp(Src) 97 F (36.1 C) (Oral)  Ht 5' 2.5" (1.588 m)  Wt 216 lb 6.4 oz (98.158 kg)  BMI 38.92 kg/m2  SpO2 97% WF centrally obese  SKIN: warm and  Dry without overt rashes, tattoos and scars  HEAD and Neck: without JVD, Head and scalp: normal Eyes:No scleral icterus. Fundi normal, eye movements normal. Ears: Auricle normal, canal normal, Tympanic membranes mild middle  Ear effusion bilaterally, insufflation normal. Nose: congested. Boggy swollen nasal mucosa. Maxillary pressure. Throat: hyperemic Neck & thyroid: normal  CHEST & LUNGS: Chest wall: normal Lungs: hacky  cough ,no rales  CVS: Reveals the PMI to be normally located. Regular rhythm, First and Second Heart sounds are normal,  absence of murmurs, rubs or gallops.  ABDOMEN:  Appearance:obese Benign, no organomegaly, no masses, no Abdominal Aortic enlargement. No Guarding , no rebound. No Bruits. Bowel sounds: normal  NEUROLOGIC: oriented to time,place and person; nonfocal.  ASSESSMENT: Sore throat - Plan: POCT rapid strep A, azithromycin (ZITHROMAX Z-PAK) 500 MG tablet, fluticasone (FLONASE) 50 MCG/ACT nasal spray  Sinusitis, acute - Plan: azithromycin (ZITHROMAX Z-PAK) 500 MG tablet, fluticasone (FLONASE) 50 MCG/ACT nasal spray  Cough  Metabolic syndrome     PLAN:      Dr Woodroe Mode Recommendations  Diet and Exercise discussed with patient.  For  nutrition information, I recommend books:  1).Eat to Live by Dr Monico Hoar. 2).Prevent and Reverse Heart Disease by Dr Suzzette Righter. 3) Dr Katherina Right Book:  Program to Reverse Diabetes  Exercise recommendations are:  If unable to walk, then the patient can exercise in a chair 3 times a day. By flapping arms like a bird gently and raising legs outwards to the front.  If ambulatory, the patient can go for walks for 30 minutes 3 times a week. Then increase the intensity and duration as tolerated.  Goal is to try to attain exercise frequency to 5 times a week.  If applicable: Best to perform resistance exercises (machines or weights) 2 days a week and cardio type exercises 3 days per week. Orders Placed This Encounter  Procedures  . POCT rapid strep A   Results for orders placed in visit on 06/01/13  POCT RAPID STREP A (OFFICE)      Result Value Range   Rapid Strep A Screen Negative  Negative   Meds ordered this encounter  Medications  . azithromycin (ZITHROMAX Z-PAK) 500 MG tablet    Sig: One daily for 3 days    Dispense:  3 each    Refill:  0  . fluticasone (FLONASE) 50 MCG/ACT nasal spray    Sig: Place 2 sprays into the nose daily.    Dispense:  16 g    Refill:  2   Return if symptoms worsen or fail to improve, for Recheck medical problems.  Gerold Sar P. Modesto Charon, M.D.

## 2013-06-02 ENCOUNTER — Telehealth: Payer: Self-pay | Admitting: Family Medicine

## 2013-06-02 ENCOUNTER — Ambulatory Visit: Payer: Self-pay | Admitting: Family Medicine

## 2013-06-03 ENCOUNTER — Telehealth: Payer: Self-pay | Admitting: Family Medicine

## 2013-06-03 NOTE — Telephone Encounter (Signed)
fpw to address 

## 2013-06-03 NOTE — Telephone Encounter (Signed)
Pt aware per dr Christell Constant to pick up mucinex max strength

## 2013-06-04 ENCOUNTER — Other Ambulatory Visit: Payer: Self-pay | Admitting: Family Medicine

## 2013-06-04 DIAGNOSIS — R05 Cough: Secondary | ICD-10-CM

## 2013-06-04 DIAGNOSIS — R059 Cough, unspecified: Secondary | ICD-10-CM

## 2013-06-04 MED ORDER — HYDROCOD POLST-CHLORPHEN POLST 10-8 MG/5ML PO LQCR: 5.0000 mL | Freq: Every evening | ORAL | Status: DC | PRN

## 2013-06-04 MED ORDER — HYDROCODONE-HOMATROPINE 5-1.5 MG/5ML PO SYRP: 5.0000 mL | ORAL_SOLUTION | Freq: Three times a day (TID) | ORAL | Status: DC | PRN

## 2013-06-04 NOTE — Addendum Note (Signed)
Addended by: Bearl Mulberry on: 06/04/2013 08:36 AM   Modules accepted: Orders

## 2013-06-04 NOTE — Telephone Encounter (Signed)
Ordered hycodan in EPIC And printed.

## 2013-06-04 NOTE — Telephone Encounter (Signed)
SPOKE WITH PT AND SHE STATED THAT KAY R IS HANDLING THIS.  THE RX THAT DR Modesto Charon WROTE WAS DISCARDED.

## 2013-06-17 ENCOUNTER — Ambulatory Visit (INDEPENDENT_AMBULATORY_CARE_PROVIDER_SITE_OTHER): Payer: BC Managed Care – PPO

## 2013-06-17 ENCOUNTER — Encounter: Payer: Self-pay | Admitting: Family Medicine

## 2013-06-17 ENCOUNTER — Ambulatory Visit (INDEPENDENT_AMBULATORY_CARE_PROVIDER_SITE_OTHER): Payer: BC Managed Care – PPO | Admitting: Family Medicine

## 2013-06-17 VITALS — BP 145/71 | HR 86 | Temp 97.2°F | Ht 62.0 in | Wt 216.6 lb

## 2013-06-17 DIAGNOSIS — M25539 Pain in unspecified wrist: Secondary | ICD-10-CM

## 2013-06-17 DIAGNOSIS — M79609 Pain in unspecified limb: Secondary | ICD-10-CM

## 2013-06-17 DIAGNOSIS — E785 Hyperlipidemia, unspecified: Secondary | ICD-10-CM

## 2013-06-17 DIAGNOSIS — I10 Essential (primary) hypertension: Secondary | ICD-10-CM

## 2013-06-17 DIAGNOSIS — R5383 Other fatigue: Secondary | ICD-10-CM

## 2013-06-17 DIAGNOSIS — E039 Hypothyroidism, unspecified: Secondary | ICD-10-CM

## 2013-06-17 DIAGNOSIS — Z139 Encounter for screening, unspecified: Secondary | ICD-10-CM

## 2013-06-17 DIAGNOSIS — M79601 Pain in right arm: Secondary | ICD-10-CM

## 2013-06-17 DIAGNOSIS — E559 Vitamin D deficiency, unspecified: Secondary | ICD-10-CM

## 2013-06-17 DIAGNOSIS — M25519 Pain in unspecified shoulder: Secondary | ICD-10-CM

## 2013-06-17 DIAGNOSIS — E8881 Metabolic syndrome: Secondary | ICD-10-CM

## 2013-06-17 DIAGNOSIS — M25531 Pain in right wrist: Secondary | ICD-10-CM

## 2013-06-17 MED ORDER — GLUCOSE BLOOD VI STRP: ORAL_STRIP | Status: DC

## 2013-06-17 NOTE — Progress Notes (Signed)
Subjective:    Patient ID: Allison Bell, female    DOB: 02-Apr-1951, 62 y.o.   MRN: [id number]  HPI Patient returns today for followup of chronic medical problems and their management. She has a history of hypothyroidism, hyperlipidemia and obesity. Other than a recent bout of sinusitis, sore throat and cough, she's been doing well and has been active physically controlling her blood sugar with diet and exercise. She recently has had increasing problems with her right arm and wrist and some numbness and tingling in her left hand.   Review of Systems  Constitutional: Positive for fatigue.  HENT: Positive for ear pain (L>R), congestion (slight, resolving) and postnasal drip (some). Negative for sore throat.   Eyes: Positive for pain (dry eyes).  Respiratory: Positive for cough (dry). Negative for chest tightness, shortness of breath and wheezing.   Cardiovascular: Negative.  Negative for chest pain, palpitations and leg swelling.  Gastrointestinal: Negative for nausea, vomiting, abdominal pain, diarrhea, constipation, blood in stool and rectal pain.  Genitourinary: Positive for frequency. Negative for dysuria, urgency, vaginal bleeding, vaginal discharge and vaginal pain.  Musculoskeletal: Positive for myalgias (legs and arms), back pain (cervical) and arthralgias (R arm hand elbow).  Skin: Negative.  Negative for color change, rash and wound.  Allergic/Immunologic: Positive for food allergies (gluten). Negative for environmental allergies.  Neurological: Positive for numbness (L thumb, 2nd and 3rd finger). Negative for tremors, syncope, weakness, light-headedness and headaches.  Psychiatric/Behavioral: Negative for confusion, sleep disturbance and agitation. The patient is not nervous/anxious.        Objective:   Physical Exam BP 145/71  Pulse 86  Temp(Src) 97.2 F (36.2 C) (Oral)  Ht 5\' 2"  (1.575 m)  Wt 216 lb 9.6 oz (98.249 kg)  BMI 39.61 kg/m2  The patient appeared well  nourished and normally developed, alert and oriented to time and place. Speech, behavior and judgement appear normal. Vital signs as documented.  Head exam is unremarkable. No scleral icterus or pallor noted. There was nasal congestion bilaterally steel. The left EAC was a little bit more red and irritated, the TM on the left normal. The posterior throat was also slightly red appear Neck is without jugular venous distension, thyromegally, or carotid bruits. Carotid upstrokes are brisk bilaterally. No cervical adenopathy. There is no anterior cervical tenderness. Lungs are clear anteriorly and posteriorly to auscultation. Normal respiratory effort. Cardiac exam reveals regular rate and rhythm at 72 per minute.. First and second heart sounds normal.  No murmurs, rubs or gallops.  Abdominal exam reveals obesity, normal bowl sounds, no masses, no organomegaly and no aortic enlargement. No inguinal adenopathy. There is no specific abdominal tenderness.  Extremities are nonedematous and both  pedal pulses are normal. There was tenderness over the deltoid insertion on the right. There is tenderness over the lateral epicondyle on the right. There was slight tenderness in the right a.c. joint area. Reflexes in the upper extremity and lower extremity were equal and normal bilaterally. There were good radial pulses bilaterally. Skin without pallor or jaundice.  Warm and dry, without rash. Neurologic exam reveals normal deep tendon reflexes and normal sensation. Foot exam revealed good pulses normal sensation and normal skin.  [facility] reading (PRIMARY) by  Dr.Chaunta Bejarano: C-spine; degenerative change  Right wrist   degenerative change                                                                                                        Right shoulder; degenerative changes and osteopenia         Assessment & Plan:  1. Metabolic syndrome  2.  Hypertension - BMP8+EGFR  3. Hypothyroid - Thyroid Panel With TSH  4. Fatigue - POCT CBC - Thyroid Panel With TSH  5. Vitamin D deficiency - Vitamin D 25 hydroxy  6. Hyperlipemia - Hepatic function panel - Lipid panel  7. Right arm pain - DG Shoulder Right - DG Cervical Spine Complete  8. Screening - Ambulatory referral to Gastroenterology  9. Deltoid tendinitis -40 of Depo-Medrol to the tendon insertion under sterile technique  10. Right wrist pain, radial side -X-ray of right wrist  Patient Instructions  Pt will schedule pap Fall precautions discussed Continue current meds and therapeutic lifestyle changes   Use saline irrigation for the sinuses, restart fluticasone 1 spray each nostril at bedtime Try cortisone 10 to the ear canal very lightly to 3 times a week Protect ear canal from the hairdryer  Try Aleve one twice daily after breakfast and supper for 10-14 days To protect her stomach from irritating side effects of the Aleve, take Zantac 75 one twice daily before breakfast and supper Get tennis elbow brace and use as directed Avoid heavy lifting and straining with right arm  Nyra Capes MD

## 2013-06-17 NOTE — Patient Instructions (Signed)
Pt will schedule pap Fall precautions discussed Continue current meds and therapeutic lifestyle changes

## 2013-06-29 ENCOUNTER — Other Ambulatory Visit (INDEPENDENT_AMBULATORY_CARE_PROVIDER_SITE_OTHER): Payer: BC Managed Care – PPO

## 2013-06-29 DIAGNOSIS — R7989 Other specified abnormal findings of blood chemistry: Secondary | ICD-10-CM

## 2013-06-29 DIAGNOSIS — R5383 Other fatigue: Secondary | ICD-10-CM

## 2013-06-29 DIAGNOSIS — E559 Vitamin D deficiency, unspecified: Secondary | ICD-10-CM

## 2013-06-29 DIAGNOSIS — R5381 Other malaise: Secondary | ICD-10-CM

## 2013-06-29 DIAGNOSIS — E785 Hyperlipidemia, unspecified: Secondary | ICD-10-CM

## 2013-06-29 DIAGNOSIS — E039 Hypothyroidism, unspecified: Secondary | ICD-10-CM

## 2013-07-01 LAB — BMP8+EGFR
BUN/Creatinine Ratio: 12 (ref 11–26)
BUN: 7 mg/dL — ABNORMAL LOW (ref 8–27)
GFR calc Af Amer: 114 mL/min/{1.73_m2} (ref >59)
GFR calc non Af Amer: 99 mL/min/{1.73_m2} (ref >59)
Glucose: 108 mg/dL — ABNORMAL HIGH (ref 65–99)

## 2013-07-01 LAB — HEPATIC FUNCTION PANEL
AST: 24 IU/L (ref 0–40)
Alkaline Phosphatase: 64 IU/L (ref 39–117)
Bilirubin, Direct: 0.11 mg/dL (ref 0.00–0.40)
Total Protein: 6.3 g/dL (ref 6.0–8.5)

## 2013-07-01 LAB — NMR, LIPOPROFILE
Cholesterol: 214 mg/dL — ABNORMAL HIGH (ref <200)
HDL Cholesterol by NMR: 59 mg/dL (ref >=40)
HDL Particle Number: 36.6 umol/L (ref >=30.5)
LDLC SERPL CALC-MCNC: 131 mg/dL — ABNORMAL HIGH (ref <100)
Triglycerides by NMR: 122 mg/dL (ref <150)

## 2013-07-01 LAB — VITAMIN D 25 HYDROXY (VIT D DEFICIENCY, FRACTURES): Vit D, 25-Hydroxy: 42.8 ng/mL (ref 30.0–100.0)

## 2013-07-01 LAB — CBC WITH DIFFERENTIAL
Basos: 1 % (ref 0–3)
Eos: 2 % (ref 0–5)
HCT: 40.3 % (ref 34.0–46.6)
Hemoglobin: 13.9 g/dL (ref 11.1–15.9)
Immature Granulocytes: 0 % (ref 0–2)
Lymphocytes Absolute: 2.5 10*3/uL (ref 0.7–3.1)
MCH: 28.8 pg (ref 26.6–33.0)
MCV: 83 fL (ref 79–97)
Monocytes Absolute: 0.6 10*3/uL (ref 0.1–0.9)
Monocytes: 6 % (ref 4–12)
Neutrophils Absolute: 6 10*3/uL (ref 1.4–7.0)
RBC: 4.83 x10E6/uL (ref 3.77–5.28)
WBC: 9.4 10*3/uL (ref 3.4–10.8)

## 2013-07-01 LAB — THYROID PANEL WITH TSH: TSH: 4.22 u[IU]/mL (ref 0.450–4.500)

## 2013-08-12 ENCOUNTER — Encounter: Payer: Self-pay | Admitting: Family Medicine

## 2013-10-08 ENCOUNTER — Other Ambulatory Visit: Payer: Self-pay | Admitting: Family Medicine

## 2013-10-27 ENCOUNTER — Ambulatory Visit: Payer: BC Managed Care – PPO | Admitting: Family Medicine

## 2014-02-16 ENCOUNTER — Encounter: Payer: Self-pay | Admitting: Family Medicine

## 2014-02-16 ENCOUNTER — Ambulatory Visit (INDEPENDENT_AMBULATORY_CARE_PROVIDER_SITE_OTHER): Payer: BC Managed Care – PPO | Admitting: Family Medicine

## 2014-02-16 ENCOUNTER — Telehealth: Payer: Self-pay | Admitting: Family Medicine

## 2014-02-16 VITALS — BP 159/79 | HR 98 | Temp 98.2°F | Ht 62.0 in | Wt 219.0 lb

## 2014-02-16 DIAGNOSIS — J209 Acute bronchitis, unspecified: Secondary | ICD-10-CM

## 2014-02-16 DIAGNOSIS — R030 Elevated blood-pressure reading, without diagnosis of hypertension: Secondary | ICD-10-CM

## 2014-02-16 DIAGNOSIS — R059 Cough, unspecified: Secondary | ICD-10-CM

## 2014-02-16 DIAGNOSIS — H539 Unspecified visual disturbance: Secondary | ICD-10-CM

## 2014-02-16 DIAGNOSIS — J329 Chronic sinusitis, unspecified: Secondary | ICD-10-CM

## 2014-02-16 DIAGNOSIS — J310 Chronic rhinitis: Secondary | ICD-10-CM

## 2014-02-16 DIAGNOSIS — R05 Cough: Secondary | ICD-10-CM

## 2014-02-16 LAB — POCT CBC
Granulocyte percent: 75.6 %G (ref 37–80)
HEMATOCRIT: 42.2 % (ref 37.7–47.9)
HEMOGLOBIN: 13.6 g/dL (ref 12.2–16.2)
Lymph, poc: 1.7 (ref 0.6–3.4)
MCH: 27 pg (ref 27–31.2)
MCHC: 32.3 g/dL (ref 31.8–35.4)
MCV: 83.7 fL (ref 80–97)
MPV: 7.9 fL (ref 0–99.8)
POC GRANULOCYTE: 6.3 (ref 2–6.9)
POC LYMPH PERCENT: 20.7 %L (ref 10–50)
Platelet Count, POC: 264 10*3/uL (ref 142–424)
RBC: 5.1 M/uL (ref 4.04–5.48)
RDW, POC: 12.5 %
WBC: 8.3 10*3/uL (ref 4.6–10.2)

## 2014-02-16 LAB — GLUCOSE, POCT (MANUAL RESULT ENTRY): POC GLUCOSE: 113 mg/dL — AB (ref 70–99)

## 2014-02-16 MED ORDER — AZITHROMYCIN 250 MG PO TABS: ORAL_TABLET | ORAL | Status: DC

## 2014-02-16 MED ORDER — AZELASTINE HCL 0.1 % NA SOLN: 1.0000 | Freq: Two times a day (BID) | NASAL | Status: DC

## 2014-02-16 MED ORDER — FLUTICASONE PROPIONATE 50 MCG/ACT NA SUSP: 2.0000 | Freq: Every day | NASAL | Status: DC

## 2014-02-16 NOTE — Telephone Encounter (Signed)
appt at 11:45 with Christell ConstantMoore

## 2014-02-16 NOTE — Progress Notes (Signed)
Subjective:    Patient ID: Allison Bell, female    DOB: 1951/11/16, 63 y.o.   MRN: [id number]  HPI Patient here today for congestion, cough, sinus pressure, and possible fever. The patient gives a history of some floaters in her eye. Said she's had this increased cough and congestion she says these floaters were this sensation in her left eye again. She has seen the ophthalmologist about this in the past and has not had any problems with it until last evening and this morning.       Patient Active Problem List   Diagnosis Date Noted  . Metabolic syndrome 24/07/7352  . Abnormal EKG 03/07/2011  . Heart murmur 03/07/2011  . IBS (irritable bowel syndrome)   . Hypertension   . HYPOTHYROIDISM 06/12/2010  . MORBID OBESITY 06/12/2010   Outpatient Encounter Prescriptions as of 02/16/2014  Medication Sig  . ARMOUR THYROID 90 MG tablet TAKE 1/2 TABLET TWICE DAILY (20 MINUTES BEFORE BREAKFAST WITH JUICE AND IN THE AFTERNOON)  . glucose blood (FREESTYLE LITE) test strip Check BS as directed  . Lancets Misc. MISC by Does not apply route.    . Nystatin ([place]) 100000 UNIT/GM POWD   . [DISCONTINUED] thyroid (ARMOUR) 30 MG tablet Take 45 mg by mouth at bedtime. 1 tab am and 1/2 tab pm    Review of Systems  Constitutional: Fever: unknown.  HENT: Positive for congestion and sinus pressure.   Eyes: Negative.   Respiratory: Positive for cough (burning).   Cardiovascular: Negative.   Gastrointestinal: Negative.   Endocrine: Negative.   Genitourinary: Negative.   Musculoskeletal: Negative.   Skin: Negative.   Allergic/Immunologic: Negative.   Neurological: Negative.   Hematological: Negative.   Psychiatric/Behavioral: Negative.        Objective:   Physical Exam  Nursing note and vitals reviewed. Constitutional: She is oriented to person, place, and time. She appears well-developed and well-nourished. She appears distressed (somewhat distressed just because of all the physical  complaint she is currently having.).  HENT:  Head: Normocephalic and atraumatic.  Right Ear: External ear normal.  The left ear canal is smaller than the right and has cerumen present. The posterior throat isn't red bilaterally. There is nasal turbinate swelling and congestion bilaterally. There is also turbinate erythema.  Eyes: Conjunctivae and EOM are normal. Pupils are equal, round, and reactive to light. Right eye exhibits no discharge. Left eye exhibits no discharge. No scleral icterus.  Neck: Normal range of motion. Neck supple. No thyromegaly present.  Cardiovascular: Normal rate, regular rhythm, normal heart sounds and intact distal pulses.  Exam reveals no gallop and no friction rub.   No murmur heard. Pulmonary/Chest: Effort normal and breath sounds normal. No respiratory distress. She has no wheezes. She has no rales. She exhibits no tenderness.  Upper airway rhonchi with coughing  Abdominal: Soft. Bowel sounds are normal.  Musculoskeletal: Normal range of motion. She exhibits no edema.  Lymphadenopathy:    She has no cervical adenopathy.  Neurological: She is alert and oriented to person, place, and time.  Skin: Skin is warm and dry. No rash noted.  Psychiatric: She has a normal mood and affect. Her behavior is normal. Judgment and thought content normal.   BP 159/79  Pulse 98  Temp(Src) 98.2 F (36.8 C) (Oral)  Ht _0  (1.575 m)  Wt 219 lb (99.338 kg)  BMI 40.05 kg/m2  Results for orders placed in visit on 02/16/14  POCT CBC      Result  Value Ref Range   WBC 8.3  4.6 - 10.2 K/uL   Lymph, poc 1.7  0.6 - 3.4   POC LYMPH PERCENT 20.7  10 - 50 %L   POC Granulocyte 6.3  2 - 6.9   Granulocyte percent 75.6  37 - 80 %G   RBC 5.1  4.04 - 5.48 M/uL   Hemoglobin 13.6  12.2 - 16.2 g/dL   HCT, POC 42.2  37.7 - 47.9 %   MCV 83.7  80 - 97 fL   MCH, POC 27.0  27 - 31.2 pg   MCHC 32.3  31.8 - 35.4 g/dL   RDW, POC 12.5     Platelet Count, POC 264.0  142 - 424 K/uL   MPV 7.9   0 - 99.8 fL  GLUCOSE, POCT (MANUAL RESULT ENTRY)      Result Value Ref Range   POC Glucose 113 (*) 70 - 99 mg/dl         Assessment & Plan:  1. Cough - POCT CBC - POCT glucose (manual entry) - BMP8+EGFR  2. Rhinosinusitis  3. Acute bronchitis  4. Visual disturbance  5. Increased blood pressure (not hypertension) Meds ordered this encounter  Medications  . azithromycin (ZITHROMAX) 250 MG tablet    Sig: As directed    Dispense:  6 tablet    Refill:  0  . fluticasone (FLONASE) 50 MCG/ACT nasal spray    Sig: Place 2 sprays into both nostrils daily.    Dispense:  16 g    Refill:  6  . azelastine (ASTELIN) 137 MCG/SPRAY nasal spray    Sig: Place 1 spray into both nostrils 2 (two) times daily. Use in each nostril as directed    Dispense:  30 mL    Refill:  12   Patient Instructions  Take medication as directed and use nose sprays as directed Take Mucinex maximum strength blue and white in color one twice daily for cough and congestion with a large glass of water Drink plenty of fluids Take Tylenol for aches pains and fever Return to clinic in 2 weeks and we will irrigate the ear canal to try to remove the cerumen Check blood pressures at home Return to clinic in 2 weeks and bring blood pressures on return If the tinnitus or ringing continues in the right ear we will arrange a visit with the ear nose and throat specialist to further evaluate the If the problems continue please call the op pulmonologist and have her recheck your Remember to watch her sodium intake closely   Arrie Senate MD

## 2014-02-16 NOTE — Patient Instructions (Addendum)
Take medication as directed and use nose sprays as directed Take Mucinex maximum strength blue and white in color one twice daily for cough and congestion with a large glass of water Drink plenty of fluids Take Tylenol for aches pains and fever Return to clinic in 2 weeks and we will irrigate the ear canal to try to remove the cerumen Check blood pressures at home Return to clinic in 2 weeks and bring blood pressures on return If the tinnitus or ringing continues in the right ear we will arrange a visit with the ear nose and throat specialist to further evaluate the If the problems continue please call the op pulmonologist and have her recheck your Remember to watch her sodium intake closely

## 2014-02-17 ENCOUNTER — Telehealth: Payer: Self-pay | Admitting: Family Medicine

## 2014-02-17 LAB — BMP8+EGFR
BUN/Creatinine Ratio: 16 (ref 11–26)
BUN: 9 mg/dL (ref 8–27)
CALCIUM: 9.5 mg/dL (ref 8.7–10.3)
CO2: 24 mmol/L (ref 18–29)
CREATININE: 0.56 mg/dL — AB (ref 0.57–1.00)
Chloride: 100 mmol/L (ref 97–108)
GFR calc Af Amer: 115 mL/min/{1.73_m2} (ref >59)
GFR, EST NON AFRICAN AMERICAN: 100 mL/min/{1.73_m2} (ref >59)
Glucose: 116 mg/dL — ABNORMAL HIGH (ref 65–99)
Potassium: 4.5 mmol/L (ref 3.5–5.2)
Sodium: 141 mmol/L (ref 134–144)

## 2014-02-17 NOTE — Telephone Encounter (Signed)
Message copied by Azalee CourseFULP, Ariahna Smiddy on Wed Feb 17, 2014 11:56 AM ------      Message from: Ernestina PennaMOORE, DONALD W      Created: Wed Feb 17, 2014  9:01 AM       The blood sugar is elevated at 116, the creatinine and the remainder of the kidney function tests including the electrolytes are within normal limits ------

## 2014-03-16 ENCOUNTER — Telehealth: Payer: Self-pay | Admitting: Family Medicine

## 2014-03-17 NOTE — Telephone Encounter (Signed)
May schedule an appointment on Friday with the triage nurse. Left this information on her voicemail.

## 2014-03-19 ENCOUNTER — Ambulatory Visit: Payer: BC Managed Care – PPO

## 2014-07-19 ENCOUNTER — Encounter: Payer: Self-pay | Admitting: Family Medicine

## 2014-07-19 ENCOUNTER — Ambulatory Visit (INDEPENDENT_AMBULATORY_CARE_PROVIDER_SITE_OTHER): Payer: BC Managed Care – PPO | Admitting: Family Medicine

## 2014-07-19 ENCOUNTER — Ambulatory Visit (INDEPENDENT_AMBULATORY_CARE_PROVIDER_SITE_OTHER): Payer: BC Managed Care – PPO

## 2014-07-19 VITALS — BP 150/80 | HR 84 | Temp 97.4°F | Ht 62.0 in | Wt 222.0 lb

## 2014-07-19 DIAGNOSIS — I10 Essential (primary) hypertension: Secondary | ICD-10-CM

## 2014-07-19 DIAGNOSIS — M549 Dorsalgia, unspecified: Secondary | ICD-10-CM

## 2014-07-19 DIAGNOSIS — H6981 Other specified disorders of Eustachian tube, right ear: Secondary | ICD-10-CM

## 2014-07-19 DIAGNOSIS — R5383 Other fatigue: Secondary | ICD-10-CM

## 2014-07-19 DIAGNOSIS — J301 Allergic rhinitis due to pollen: Secondary | ICD-10-CM

## 2014-07-19 DIAGNOSIS — E8881 Metabolic syndrome: Secondary | ICD-10-CM

## 2014-07-19 DIAGNOSIS — H6980 Other specified disorders of Eustachian tube, unspecified ear: Secondary | ICD-10-CM

## 2014-07-19 DIAGNOSIS — H6990 Unspecified Eustachian tube disorder, unspecified ear: Secondary | ICD-10-CM

## 2014-07-19 DIAGNOSIS — R5381 Other malaise: Secondary | ICD-10-CM

## 2014-07-19 DIAGNOSIS — E039 Hypothyroidism, unspecified: Secondary | ICD-10-CM

## 2014-07-19 NOTE — Progress Notes (Signed)
Subjective:    Patient ID: Allison Bell, female    DOB: [birth year], 63 y.o.   MRN: [id number]  HPI Pt here for follow up and management of chronic medical problems. The patient has been having increased fatigue. She is also having side and abdominal and mid back pain which comes and goes. She has also had some the right ear. She didn't get lab work and she will return for this fasting. She is also due for a Pap and pelvic and mammogram. The patient was sick for over a month with cough and congestion and did not do her regular exercise routine. After this illness she went back to her exercise routine and seemed to notice more problems with abdominal pain and back pain with the exercises restarted. When she gets up and moves she feels better.        Patient Active Problem List   Diagnosis Date Noted  . Metabolic syndrome 11/94/1740  . Abnormal EKG 03/07/2011  . Heart murmur 03/07/2011  . IBS (irritable bowel syndrome)   . Hypertension   . HYPOTHYROIDISM 06/12/2010  . MORBID OBESITY 06/12/2010   Outpatient Encounter Prescriptions as of 07/19/2014  Medication Sig  . glucose blood (FREESTYLE LITE) test strip Check BS as directed  . Lancets Misc. MISC by Does not apply route.    . thyroid (ARMOUR THYROID) 90 MG tablet TAKE 1/2 TABLET daily (20 MINUTES BEFORE BREAKFAST WITH JUICE AND IN THE AFTERNOON)  . [DISCONTINUED] ARMOUR THYROID 90 MG tablet TAKE 1/2 TABLET TWICE DAILY (20 MINUTES BEFORE BREAKFAST WITH JUICE AND IN THE AFTERNOON)  . [DISCONTINUED] azelastine (ASTELIN) 137 MCG/SPRAY nasal spray Place 1 spray into both nostrils 2 (two) times daily. Use in each nostril as directed  . [DISCONTINUED] azithromycin (ZITHROMAX) 250 MG tablet As directed  . [DISCONTINUED] fluticasone (FLONASE) 50 MCG/ACT nasal spray Place 2 sprays into both nostrils daily.  . [DISCONTINUED] Nystatin ([place]) 100000 UNIT/GM POWD     Review of Systems  Constitutional: Positive for fatigue (no energy).    HENT:       Right ear ringing  Eyes: Negative.   Respiratory: Negative.   Cardiovascular: Negative.   Gastrointestinal: Negative.   Endocrine: Negative.   Genitourinary: Negative.   Musculoskeletal: Positive for myalgias (some neck pain, some mid- back pain, some abdomen area pains).  Skin: Negative.   Allergic/Immunologic: Negative.   Neurological: Negative.   Hematological: Negative.   Psychiatric/Behavioral: Negative.        Stress now        Objective:   Physical Exam  Nursing note and vitals reviewed. Constitutional: She is oriented to person, place, and time. She appears well-developed and well-nourished. No distress.  HENT:  Head: Normocephalic and atraumatic.  Left Ear: External ear normal.  Mouth/Throat: Oropharynx is clear and moist. No oropharyngeal exudate.  There is nasal congestion bilaterally. There is a minimal amount of cerumen in the right ear canal.  Eyes: Conjunctivae and EOM are normal. Pupils are equal, round, and reactive to light. Right eye exhibits no discharge. Left eye exhibits no discharge. No scleral icterus.  Neck: Normal range of motion. Neck supple. No thyromegaly present.  No carotid bruits or anterior cervical node  Cardiovascular: Normal rate, regular rhythm, normal heart sounds and intact distal pulses.  Exam reveals no gallop and no friction rub.   No murmur heard. At 72 per minute  Pulmonary/Chest: Effort normal and breath sounds normal. No respiratory distress. She has no wheezes. She has no rales.  She exhibits no tenderness.  Abdominal: Soft. Bowel sounds are normal. She exhibits no mass. There is no tenderness. There is no rebound and no guarding.  There is abdominal obesity. There is a small amount of swelling or asymmetry below the left rib cage compared to the right--- this is more obvious with standing than with being supine  Musculoskeletal: Normal range of motion. She exhibits no edema and no tenderness.  Lymphadenopathy:    She  has no cervical adenopathy.  Neurological: She is alert and oriented to person, place, and time. She has normal reflexes. No cranial nerve deficit.  Skin: Skin is warm and dry. No rash noted. No erythema.  Psychiatric: She has a normal mood and affect. Her behavior is normal. Judgment and thought content normal.   BP 155/75  Pulse 92  Temp(Src) 97.4 F (36.3 C) (Oral)  Ht _0  (1.575 m)  Wt 222 lb (100.699 kg)  BMI 40.59 kg/m2  Repeat blood pressure 140/84 right arm sitting large cuff  [facility] reading (PRIMARY) by  Dr. Brunilda Payor x-ray, thoracic spine   --- chest x-ray no active disease, thoracic spine degenerative changes                                   Assessment & Plan:  1. Essential hypertension - POCT CBC; Future - BMP8+EGFR; Future - Hepatic function panel; Future - NMR, lipoprofile; Future - DG Chest 2 View; Future  2. HYPOTHYROIDISM - POCT CBC; Future - Thyroid Panel With TSH; Future  3. Metabolic syndrome - POCT CBC; Future - POCT glycosylated hemoglobin (Hb A1C); Future  4. Other fatigue - POCT CBC; Future - POCT glycosylated hemoglobin (Hb A1C); Future - BMP8+EGFR; Future - Hepatic function panel; Future - NMR, lipoprofile; Future - Thyroid Panel With TSH; Future - Vit D  25 hydroxy (rtn osteoporosis monitoring); Future  5. Mid back pain - DG Thoracic Spine 2 View; Future - DG Chest 2 View; Future  6. Allergic rhinitis due to pollen  7. Eustachian tube dysfunction, right  Meds ordered this encounter  Medications  . thyroid (ARMOUR THYROID) 90 MG tablet    Sig: TAKE 1/2 TABLET daily (20 MINUTES BEFORE BREAKFAST WITH JUICE AND IN THE AFTERNOON)   Patient Instructions  Continue current medications. Continue good therapeutic lifestyle changes which include good diet and exercise. Fall precautions discussed with patient. If an FOBT was given today- please return it to our front desk. If you are over 12 years old - you may need Prevnar 20 or the  adult Pneumonia vaccine.  Flu Shots will be available at our office starting mid- September. Please call and schedule a FLU CLINIC APPOINTMENT.   Use warm wet compresses to her back Ibuprofen or Advil one twice daily after breakfast and supper for one week Use Flonase over-the-counter 1-2 sprays each nostril at bedtime Use saline nose spray during the day  Arrie Senate MD

## 2014-07-19 NOTE — Patient Instructions (Addendum)
Continue current medications. Continue good therapeutic lifestyle changes which include good diet and exercise. Fall precautions discussed with patient. If an FOBT was given today- please return it to our front desk. If you are over 64 years old - you may need Prevnar 13 or the adult Pneumonia vaccine.  Flu Shots will be available at our office starting mid- September. Please call and schedule a FLU CLINIC APPOINTMENT.   Use warm wet compresses to her back Ibuprofen or Advil one twice daily after breakfast and supper for one week Use Flonase over-the-counter 1-2 sprays each nostril at bedtime Use saline nose spray during the day

## 2014-07-20 ENCOUNTER — Telehealth: Payer: Self-pay

## 2014-07-20 NOTE — Telephone Encounter (Signed)
Message copied by Roselee Culver on Tue Jul 20, 2014  9:16 AM ------      Message from: Ernestina Penna      Created: Mon Jul 19, 2014  5:43 PM       There is diffuse spondylitic endplate spurring in the thoracic spine which more than likely is playing a role with the discomfort that she is feeling in her back and around to the front      The ibuprofen or Advil should help this and limiting aggressive upper body physical activity and making that activity be more mild until the inflammation clears ------

## 2014-07-20 NOTE — Telephone Encounter (Signed)
Pt aware of xray results

## 2014-07-20 NOTE — Telephone Encounter (Signed)
Message copied by Sendy Pluta on Tue Jul 20, 2014  9:16 AM ------      Message from: MOORE, DONALD W      Created: Mon Jul 19, 2014  5:43 PM       There is diffuse spondylitic endplate spurring in the thoracic spine which more than likely is playing a role with the discomfort that she is feeling in her back and around to the front      The ibuprofen or Advil should help this and limiting aggressive upper body physical activity and making that activity be more mild until the inflammation clears ------ 

## 2014-07-21 ENCOUNTER — Other Ambulatory Visit (INDEPENDENT_AMBULATORY_CARE_PROVIDER_SITE_OTHER): Payer: BC Managed Care – PPO

## 2014-07-21 DIAGNOSIS — R5383 Other fatigue: Secondary | ICD-10-CM

## 2014-07-21 DIAGNOSIS — I10 Essential (primary) hypertension: Secondary | ICD-10-CM

## 2014-07-21 DIAGNOSIS — R5381 Other malaise: Secondary | ICD-10-CM

## 2014-07-21 DIAGNOSIS — E8881 Metabolic syndrome: Secondary | ICD-10-CM

## 2014-07-21 DIAGNOSIS — E039 Hypothyroidism, unspecified: Secondary | ICD-10-CM

## 2014-07-21 LAB — POCT CBC
Granulocyte percent: 71.6 %G (ref 37–80)
HCT, POC: 42 % (ref 37.7–47.9)
Hemoglobin: 13.9 g/dL (ref 12.2–16.2)
LYMPH, POC: 2.6 (ref 0.6–3.4)
MCH: 27.5 pg (ref 27–31.2)
MCHC: 33.1 g/dL (ref 31.8–35.4)
MCV: 83.1 fL (ref 80–97)
MPV: 8.4 fL (ref 0–99.8)
PLATELET COUNT, POC: 280 10*3/uL (ref 142–424)
POC Granulocyte: 7.1 — AB (ref 2–6.9)
POC LYMPH %: 26.5 % (ref 10–50)
RBC: 5.1 M/uL (ref 4.04–5.48)
RDW, POC: 13.2 %
WBC: 9.9 10*3/uL (ref 4.6–10.2)

## 2014-07-21 LAB — POCT GLYCOSYLATED HEMOGLOBIN (HGB A1C): Hemoglobin A1C: 5.9

## 2014-07-22 LAB — NMR, LIPOPROFILE
Cholesterol: 212 mg/dL — ABNORMAL HIGH (ref 100–199)
HDL Cholesterol by NMR: 48 mg/dL (ref >39)
HDL PARTICLE NUMBER: 32.3 umol/L (ref >=30.5)
LDL Particle Number: 1871 nmol/L — ABNORMAL HIGH (ref <1000)
LDL Size: 21.1 nm (ref >20.5)
LDLC SERPL CALC-MCNC: 128 mg/dL — AB (ref 0–99)
LP-IR Score: 74 — ABNORMAL HIGH (ref <=45)
Small LDL Particle Number: 539 nmol/L — ABNORMAL HIGH (ref <=527)
TRIGLYCERIDES BY NMR: 181 mg/dL — AB (ref 0–149)

## 2014-07-22 LAB — BMP8+EGFR
BUN / CREAT RATIO: 14 (ref 11–26)
BUN: 9 mg/dL (ref 8–27)
CHLORIDE: 101 mmol/L (ref 97–108)
CO2: 21 mmol/L (ref 18–29)
Calcium: 9.2 mg/dL (ref 8.7–10.3)
Creatinine, Ser: 0.63 mg/dL (ref 0.57–1.00)
GFR calc non Af Amer: 96 mL/min/{1.73_m2} (ref >59)
GFR, EST AFRICAN AMERICAN: 110 mL/min/{1.73_m2} (ref >59)
Glucose: 147 mg/dL — ABNORMAL HIGH (ref 65–99)
POTASSIUM: 4.6 mmol/L (ref 3.5–5.2)
Sodium: 143 mmol/L (ref 134–144)

## 2014-07-22 LAB — THYROID PANEL WITH TSH
FREE THYROXINE INDEX: 2 (ref 1.2–4.9)
T3 Uptake Ratio: 24 % (ref 24–39)
T4, Total: 8.5 ug/dL (ref 4.5–12.0)
TSH: 2.47 u[IU]/mL (ref 0.450–4.500)

## 2014-07-22 LAB — HEPATIC FUNCTION PANEL
ALK PHOS: 71 IU/L (ref 39–117)
ALT: 20 IU/L (ref 0–32)
AST: 21 IU/L (ref 0–40)
Albumin: 4.6 g/dL (ref 3.6–4.8)
Bilirubin, Direct: 0.08 mg/dL (ref 0.00–0.40)
Total Bilirubin: 0.4 mg/dL (ref 0.0–1.2)
Total Protein: 6.3 g/dL (ref 6.0–8.5)

## 2014-07-22 LAB — VITAMIN D 25 HYDROXY (VIT D DEFICIENCY, FRACTURES): Vit D, 25-Hydroxy: 43.7 ng/mL (ref 30.0–100.0)

## 2014-11-30 ENCOUNTER — Ambulatory Visit: Payer: BC Managed Care – PPO | Admitting: Family Medicine

## 2015-01-21 ENCOUNTER — Other Ambulatory Visit: Payer: Self-pay | Admitting: Nurse Practitioner

## 2015-04-27 ENCOUNTER — Ambulatory Visit (INDEPENDENT_AMBULATORY_CARE_PROVIDER_SITE_OTHER): Payer: BC Managed Care – PPO | Admitting: Family Medicine

## 2015-04-27 ENCOUNTER — Encounter: Payer: Self-pay | Admitting: Family Medicine

## 2015-04-27 VITALS — BP 139/84 | HR 88 | Temp 97.2°F | Ht 62.0 in | Wt 218.0 lb

## 2015-04-27 DIAGNOSIS — R1030 Lower abdominal pain, unspecified: Secondary | ICD-10-CM | POA: Diagnosis not present

## 2015-04-27 DIAGNOSIS — M5441 Lumbago with sciatica, right side: Secondary | ICD-10-CM

## 2015-04-27 DIAGNOSIS — E079 Disorder of thyroid, unspecified: Secondary | ICD-10-CM | POA: Insufficient documentation

## 2015-04-27 DIAGNOSIS — M7712 Lateral epicondylitis, left elbow: Secondary | ICD-10-CM

## 2015-04-27 DIAGNOSIS — R1031 Right lower quadrant pain: Secondary | ICD-10-CM

## 2015-04-27 NOTE — Progress Notes (Signed)
Subjective:    Patient ID: Nanetta BattyYvonne S Destin, female    DOB: 05/21/1951, 64 y.o.   MRN: [id number]  HPI  The patient complains of pain in the right anterior thigh buttocks and groin area with no history of any injury. She also complains of left elbow pain with no history of any injury. Her review of systems is otherwise negative.  Patient Active Problem List   Diagnosis Date Noted  . Thyroid disease   . Metabolic syndrome 06/01/2013  . Abnormal EKG 03/07/2011  . Heart murmur 03/07/2011  . IBS (irritable bowel syndrome)   . Hypertension   . HYPOTHYROIDISM 06/12/2010  . MORBID OBESITY 06/12/2010   Outpatient Encounter Prescriptions as of 04/27/2015  Medication Sig  . ARMOUR THYROID 90 MG tablet TAKE 1/2 TABLET TWICE DAILY (20 MINUTES BEFORE BREAKFAST WITH JUICE AN D IN THE AFTERNOON)  . glucose blood (FREESTYLE LITE) test strip Check BS as directed  . Lancets Misc. MISC by Does not apply route.    . [DISCONTINUED] thyroid (ARMOUR THYROID) 90 MG tablet TAKE 1/2 TABLET daily (20 MINUTES BEFORE BREAKFAST WITH JUICE AND IN THE AFTERNOON)   No facility-administered encounter medications on file as of 04/27/2015.      Review of Systems  Constitutional: Negative.   HENT: Negative.   Eyes: Negative.   Respiratory: Negative.   Cardiovascular: Negative.   Gastrointestinal: Negative.   Endocrine: Negative.   Genitourinary: Negative.   Musculoskeletal: Positive for myalgias (Posterior right thigh pain below buttocks that radiates to groin and anterior thigh) and arthralgias (Left elbow pain with no known injury).  Skin: Negative.   Allergic/Immunologic: Negative.   Neurological: Negative.   Hematological: Negative.   Psychiatric/Behavioral: Negative.        Objective:   Physical Exam  Constitutional: She is oriented to person, place, and time. She appears well-developed and well-nourished. No distress.  HENT:  Head: Normocephalic and atraumatic.  Eyes: Conjunctivae and EOM are  normal. Pupils are equal, round, and reactive to light. Right eye exhibits no discharge. Left eye exhibits no discharge. No scleral icterus.  Neck: Normal range of motion.  Musculoskeletal: Normal range of motion. She exhibits tenderness.  There is tenderness over the left lateral epicondyle. There is also tenderness in the posterior right medial thigh near the buttock. There is also some tenderness in the right groin. There is good range of motion of both lower extremities hip wise. There was no pain reproduced. There was some pain reproduced when trying to raise the right leg against pressure in the posterior right hip.  Neurological: She is alert and oriented to person, place, and time.  Skin: Skin is warm and dry. No rash noted.  Psychiatric: She has a normal mood and affect. Her behavior is normal. Judgment and thought content normal.  Nursing note and vitals reviewed.   BP 151/74 mmHg  Pulse 88  Temp(Src) 97.2 F (36.2 C) (Oral)  Ht 5\' 2"  (1.575 m)  Wt 218 lb (98.884 kg)  BMI 39.86 kg/m2  Blood pressure was repeated at 139/84 right arm sitting large cuff     Assessment & Plan:  1. Right-sided low back pain with right-sided sciatica -The patient will take Aleve twice daily after breakfast and supper for the next 7-10 days -If she is not better after taking Aleve for the next 7-10 days we will make an appointment for her to see Dr. Merlyn AlbertALucio when he's seeing patients in our office - DG Lumbar Spine 2-3 Views; Future -  DG HIP UNILAT WITH PELVIS 2-3 VIEWS RIGHT; Future  2. Left lateral epicondylitis -She will wear a tennis elbow brace and she was instructed as to where to apply this. She will also use warm wet compress take Aleve twice a day after breakfast and supper  3. Right groin pain -Take Aleve as directed and come in in the morning for hip films and lumbar spine   Patient Instructions  Apply tennis elbow brace as directed and use warm wet compresses 20 minutes 3 or 4 times  daily Take Aleve twice a day today after breakfast and supper Return tomorrow for x-rays of pelvis and LS spine because of right hip and groin pain   Nyra Capes MD

## 2015-04-27 NOTE — Patient Instructions (Addendum)
Apply tennis elbow brace as directed and use warm wet compresses 20 minutes 3 or 4 times daily Take Aleve twice a day today after breakfast and supper Return tomorrow for x-rays of pelvis and LS spine because of right hip and groin pain

## 2015-04-28 ENCOUNTER — Other Ambulatory Visit (INDEPENDENT_AMBULATORY_CARE_PROVIDER_SITE_OTHER): Payer: BC Managed Care – PPO

## 2015-04-28 DIAGNOSIS — M5441 Lumbago with sciatica, right side: Secondary | ICD-10-CM

## 2015-05-12 ENCOUNTER — Other Ambulatory Visit (INDEPENDENT_AMBULATORY_CARE_PROVIDER_SITE_OTHER): Payer: BC Managed Care – PPO

## 2015-05-12 DIAGNOSIS — R300 Dysuria: Secondary | ICD-10-CM

## 2015-05-12 LAB — POCT UA - MICROSCOPIC ONLY
Bacteria, U Microscopic: NEGATIVE
Casts, Ur, LPF, POC: NEGATIVE
Crystals, Ur, HPF, POC: NEGATIVE
MUCUS UA: NEGATIVE
RBC, URINE, MICROSCOPIC: NEGATIVE
WBC, Ur, HPF, POC: NEGATIVE
Yeast, UA: NEGATIVE

## 2015-05-12 LAB — POCT URINALYSIS DIPSTICK
BILIRUBIN UA: NEGATIVE
Blood, UA: NEGATIVE
GLUCOSE UA: NEGATIVE
Ketones, UA: NEGATIVE
Leukocytes, UA: NEGATIVE
Nitrite, UA: NEGATIVE
Protein, UA: NEGATIVE
Urobilinogen, UA: NEGATIVE
pH, UA: 7.5

## 2015-05-12 NOTE — Progress Notes (Signed)
Lab only 

## 2015-05-13 LAB — URINE CULTURE

## 2015-05-17 ENCOUNTER — Encounter: Payer: Self-pay | Admitting: *Deleted

## 2015-08-19 ENCOUNTER — Ambulatory Visit (INDEPENDENT_AMBULATORY_CARE_PROVIDER_SITE_OTHER): Payer: BC Managed Care – PPO | Admitting: Family Medicine

## 2015-08-19 ENCOUNTER — Encounter: Payer: Self-pay | Admitting: Family Medicine

## 2015-08-19 VITALS — BP 139/84 | HR 112 | Temp 98.3°F | Ht 62.0 in | Wt 214.8 lb

## 2015-08-19 DIAGNOSIS — J209 Acute bronchitis, unspecified: Secondary | ICD-10-CM | POA: Diagnosis not present

## 2015-08-19 MED ORDER — FLUTICASONE PROPIONATE 50 MCG/ACT NA SUSP: 2.0000 | Freq: Every day | NASAL | Status: DC

## 2015-08-19 MED ORDER — BENZONATATE 100 MG PO CAPS: 100.0000 mg | ORAL_CAPSULE | Freq: Two times a day (BID) | ORAL | Status: DC | PRN

## 2015-08-19 MED ORDER — AZITHROMYCIN 250 MG PO TABS: ORAL_TABLET | ORAL | Status: DC

## 2015-08-19 NOTE — Progress Notes (Signed)
BP 139/84 mmHg  Pulse 112  Temp(Src) 98.3 F (36.8 C) (Oral)  Ht  (1.575 m)  Wt 214 lb 12.8 oz (97.433 kg)  BMI 39.28 kg/m2   Subjective:    Patient ID: Allison Bell, female    DOB: 06/17/1951, 64 y.o.   MRN: [id number]  HPI: Allison Bell is a 64 y.o. female presenting on 08/19/2015 for Ear Pain; Cough; and Sinusitis   HPI Cough and sore throat Patient presents today with a 10 day history of cough, runny nose, sinus pressure, and sore throat. She denies any fevers or chills. She admits to postnasal drainage especially worsening her cough at night. She uses a lot of alternative medicine like teas and herbal preparations and has been attempting to use those. She has had such a significant cough that the blood vessel in her conjunctiva has burst. She is the point where she is ready to try medicines besides alternative medicines to see if they can help.  Relevant past medical, surgical, family and social history reviewed and updated as indicated. Interim medical history since our last visit reviewed. Allergies and medications reviewed and updated.  Review of Systems  Constitutional: Negative for fever and chills.  HENT: Positive for congestion, postnasal drip, rhinorrhea, sinus pressure, sneezing and sore throat. Negative for ear discharge and ear pain.   Eyes: Negative for pain, redness and visual disturbance.  Respiratory: Positive for cough. Negative for chest tightness and shortness of breath.   Cardiovascular: Negative for chest pain and leg swelling.  Genitourinary: Negative for dysuria and difficulty urinating.  Musculoskeletal: Negative for back pain and gait problem.  Skin: Negative for rash.  Neurological: Negative for light-headedness and headaches.  Psychiatric/Behavioral: Negative for behavioral problems and agitation.  All other systems reviewed and are negative.   Per HPI unless specifically indicated above     Medication List       This list is  accurate as of: 08/19/15  3:48 PM.  Always use your most recent med list.               ARMOUR THYROID 90 MG tablet  Generic drug:  thyroid  TAKE 1/2 TABLET TWICE DAILY (20 MINUTES BEFORE BREAKFAST WITH JUICE AN D IN THE AFTERNOON)     azithromycin 250 MG tablet  Commonly known as:  ZITHROMAX  Take 2 the first day and then one each day after.     benzonatate 100 MG capsule  Commonly known as:  TESSALON  Take 1 capsule (100 mg total) by mouth 2 (two) times daily as needed for cough.     fluticasone 50 MCG/ACT nasal spray  Commonly known as:  FLONASE  Place 2 sprays into both nostrils daily.     glucose blood test strip  Commonly known as:  FREESTYLE LITE  Check BS as directed     Lancets Misc. Misc  by Does not apply route.           Objective:    BP 139/84 mmHg  Pulse 112  Temp(Src) 98.3 F (36.8 C) (Oral)  Ht  (1.575 m)  Wt 214 lb 12.8 oz (97.433 kg)  BMI 39.28 kg/m2  Wt Readings from Last 3 Encounters:  08/19/15 214 lb 12.8 oz (97.433 kg)  04/27/15 218 lb (98.884 kg)  07/19/14 222 lb (100.699 kg)    Physical Exam  Constitutional: She is oriented to person, place, and time. She appears well-developed and well-nourished. No distress.  HENT:  Right Ear:  Tympanic membrane, external ear and ear canal normal.  Left Ear: Tympanic membrane, external ear and ear canal normal.  Nose: Mucosal edema and rhinorrhea present. No epistaxis. Right sinus exhibits no maxillary sinus tenderness and no frontal sinus tenderness. Left sinus exhibits no maxillary sinus tenderness and no frontal sinus tenderness.  Mouth/Throat: Uvula is midline and mucous membranes are normal. Posterior oropharyngeal edema and posterior oropharyngeal erythema present. No oropharyngeal exudate or tonsillar abscesses.  Eyes: EOM are normal.    Cardiovascular: Normal rate, regular rhythm, normal heart sounds and intact distal pulses.   No murmur heard. Pulmonary/Chest: Effort normal and breath  sounds normal. No respiratory distress. She has no wheezes.  Musculoskeletal: Normal range of motion. She exhibits no edema or tenderness.  Neurological: She is alert and oriented to person, place, and time. Coordination normal.  Skin: Skin is warm and dry. No rash noted. She is not diaphoretic.  Psychiatric: She has a normal mood and affect. Her behavior is normal.  Vitals reviewed.   Results for orders placed or performed in visit on 05/12/15  Urine culture  Result Value Ref Range   Urine Culture, Routine Final report    Urine Culture result 1 Comment   POCT UA - Microscopic Only  Result Value Ref Range   WBC, Ur, HPF, POC neg    RBC, urine, microscopic neg    Bacteria, U Microscopic neg    Mucus, UA neg    Epithelial cells, urine per micros occ    Crystals, Ur, HPF, POC neg    Casts, Ur, LPF, POC neg    Yeast, UA neg   POCT urinalysis dipstick  Result Value Ref Range   Color, UA yellow    Clarity, UA clear    Glucose, UA neg    Bilirubin, UA neg    Ketones, UA neg    Spec Grav, UA <=1.005    Blood, UA neg    pH, UA 7.5    Protein, UA neg    Urobilinogen, UA negative    Nitrite, UA neg    Leukocytes, UA Negative Negative      Assessment & Plan:   Problem List Items Addressed This Visit    None    Visit Diagnoses    Acute bronchitis, unspecified organism    -  Primary    Upper respiratory infection for 10 days. Will send azithromycin and Flonase and Tessalon Perles    Relevant Medications    azithromycin (ZITHROMAX) 250 MG tablet    fluticasone (FLONASE) 50 MCG/ACT nasal spray    benzonatate (TESSALON) 100 MG capsule        Follow up plan: Return in about 2 weeks (around 09/02/2015), or if symptoms worsen or fail to improve, for Yearly exam and thyroid recheck.  Arville Care, MD [facility] 08/19/2015, 3:48 PM

## 2015-09-02 ENCOUNTER — Ambulatory Visit: Payer: Self-pay | Admitting: Family Medicine

## 2015-09-17 ENCOUNTER — Ambulatory Visit (INDEPENDENT_AMBULATORY_CARE_PROVIDER_SITE_OTHER): Payer: BC Managed Care – PPO | Admitting: Nurse Practitioner

## 2015-09-17 VITALS — BP 166/82 | HR 84 | Temp 96.9°F | Ht 62.0 in | Wt 219.0 lb

## 2015-09-17 DIAGNOSIS — R079 Chest pain, unspecified: Secondary | ICD-10-CM | POA: Diagnosis not present

## 2015-09-17 NOTE — Patient Instructions (Signed)

## 2015-09-17 NOTE — Progress Notes (Signed)
   Subjective:    Patient ID: Allison Bell, female    DOB: 12/22/1950, 64 y.o.   MRN: [id number]  HPI Patient in today c/o chest pain- started earlier today- she says she has upper back and neck problems that sometimes radiates into chest area- she also had to drive her son to the eye doctor this morning and she was having her usual upper back pain and then [ain radiated around to chest and she got panicky. Denies SOB, sweating or palpitations. Current pain in chest 4/10 and describes it as a tightness.   Review of Systems  Constitutional: Negative.   HENT: Negative.   Respiratory: Negative.   Cardiovascular: Negative.   Gastrointestinal: Negative.   Genitourinary: Negative.   Neurological: Negative.   Psychiatric/Behavioral: Negative.   All other systems reviewed and are negative.      Objective:   Physical Exam  Constitutional: She is oriented to person, place, and time. She appears well-developed and well-nourished.  Cardiovascular: Normal rate, regular rhythm and normal heart sounds.   Pulmonary/Chest: Effort normal and breath sounds normal.  Neurological: She is alert and oriented to person, place, and time.  Skin: Skin is warm and dry.  Psychiatric: She has a normal mood and affect. Her behavior is normal. Judgment and thought content normal.   BP 166/82 mmHg  Pulse 84  Temp(Src) 96.9 F (36.1 C) (Oral)  Ht 5\' 2"  (1.575 m)  Wt 219 lb (99.338 kg)  BMI 40.05 kg/m2  EKG - Brendolyn PattyNSR-Mary-Margaret Carlis Blanchard, FNP        Assessment & Plan:   1. Chest pain, unspecified chest pain type    Daily baby ASA If pain continues and develop SOB- need to go to ER RTO prn  Mary-Margaret Daphine DeutscherMartin, FNP

## 2015-09-29 ENCOUNTER — Ambulatory Visit (INDEPENDENT_AMBULATORY_CARE_PROVIDER_SITE_OTHER): Payer: PPO | Admitting: Family Medicine

## 2015-09-29 ENCOUNTER — Encounter: Payer: Self-pay | Admitting: Family Medicine

## 2015-09-29 VITALS — BP 142/90 | HR 85 | Ht 62.5 in | Wt 217.0 lb

## 2015-09-29 DIAGNOSIS — M2550 Pain in unspecified joint: Secondary | ICD-10-CM

## 2015-09-29 DIAGNOSIS — E079 Disorder of thyroid, unspecified: Secondary | ICD-10-CM | POA: Diagnosis not present

## 2015-09-29 MED ORDER — LEVOTHYROXINE SODIUM 25 MCG PO TABS: 25.0000 ug | ORAL_TABLET | Freq: Every day | ORAL | Status: DC

## 2015-09-29 NOTE — Assessment & Plan Note (Signed)
Patient is having numerous different muscle aches and pains as well as joint pains. I think that this is multifactorial. Think patient's thyroid disease, anxiety disorder, as well as obesity are HER being. I do think that there is a possibility for patient having some iron deficiency. Patient would like to try conservative therapy. We discussed different over-the-counter natural supplements. We discussed timing of food in the importance of protein with patient and increasing her activity. Patient is going to try to make these changes and come back and see me again aches.

## 2015-09-29 NOTE — Progress Notes (Signed)
[facility] [place], [place] [address] Phone: [phone] Subjective:    I'm seeing this patient by the request  of:  Redge Gainer, MD   CC: Arthralgia  GGY:IRSWNIOEVO Allison Bell is a 64 y.o. female coming in with complaint of multiple pain.patient has had a long-standing history of this. Patient has been seen by multiple different [facility] medical doctors. Patient has been treated for hypothyroidism as well as metabolic syndrome. Patient has had difficulty with both knees. States that she has mostly an aching dull throbbing pain of the upper back as well as neck. Sometimes in her lower back as well. Can radiate to her arms. States that it's more diffusely. She can of days that are good and days a seem to be worse. No significant reason why. Denies any weakness.states that sometimes she can have some numbness. Does stop her from activities on her worst days. No fevers or chills. Patient states that she was to avoid a lot of medications if possible. Only takes an anti-inflammatory as needed which does seem to be helpful.  Past Medical History  Diagnosis Date  . IBS (irritable bowel syndrome)   . Hypertension   . Thyroid disease    Past Surgical History  Procedure Laterality Date  . Abdominal hysterectomy    . Ovarian cyst removal    . Cesarean section     Social History  Substance Use Topics  . Smoking status: Never Smoker   . Smokeless tobacco: Never Used  . Alcohol Use: No   Allergies  Allergen Reactions  . Epinephrine     REACTION: heart pounds and races  . Penicillins     REACTION: swelling and rash  . Vancomycin     REACTION: head throbbing,heart raced, wave of heat throughout body   No family history on file.   reviewed patient's chart. Patient did have x-rays of the thoracic spine previously. 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 Right hip x-rays unremarkable  for any arthritis or bony abnormality. Cervical neck x-rays taken July 2014 showed multilevel degenerative disc disease. Reviewing patient's chart she has had multiple different x-rays of multiple joints. Past medical history, social, surgical and family history all reviewed in electronic medical record.   Review of Systems: No headache, visual changes, nausea, vomiting, diarrhea, constipation, dizziness, abdominal pain, skin rash, fevers, chills, night sweats, weight loss, swollen lymph nodes, body aches, joint swelling, muscle aches, chest pain, shortness of breath, mood changes.   Objective Blood pressure 142/90, height 5' 2.5" (1.588 m), weight 217 lb (98.431 kg).  General: No apparent distress alert and oriented x3 mood and affect normal, dressed appropriately.  HEENT: Pupils equal, extraocular movements intact  Respiratory: Patient's speak in full sentences and does not appear short of breath  Cardiovascular: No lower extremity edema, non tender, no erythema  Skin: Warm dry intact with no signs of infection or rash on extremities or on axial skeleton.  Abdomen: Soft nontender  Neuro: Cranial nerves II through XII are intact, neurovascularly intact in all extremities with 2+ DTRs and 2+ pulses.  Lymph: No lymphadenopathy of posterior or anterior cervical chain or axillae bilaterally.  Gait normal with good balance and coordination.  MSK:  Non tender with full range of motion and good stability and symmetric strength and tone of shoulders, elbows, wrist, hip, knee and ankles bilaterally.  Patient is diffusely tender in multiple joints.  Neck: Inspection unremarkable. No palpable  stepoffs.patient is tender in the paraspinal musculature Negative Spurling's maneuver. Full neck range of motion patient is tight in the extreme range of motion. Grip strength and sensation normal in bilateral hands Strength good C4 to T1 distribution No sensory change to C4 to T1 Negative Hoffman sign  bilaterally Reflexes normal  Back Exam:  Inspection: Unremarkable patient does have central obesity Motion: Flexion 45 deg, Extension 45 deg, Side Bending to 45 deg bilaterally,  Rotation to 45 deg bilaterally  SLR laying: Negative  XSLR laying: Negative  Palpable tenderness:no spinous process tenderness. Patient does appear spinal musculature tenderness. FABER: negative. Sensory change: Gross sensation intact to all lumbar and sacral dermatomes.  Reflexes: 2+ at both patellar tendons, 2+ at achilles tendons, Babinski's downgoing.  Strength at foot  Plantar-flexion: 5/5 Dorsi-flexion: 5/5 Eversion: 5/5 Inversion: 5/5  Leg strength  Quad: 5/5 Hamstring: 5/5 Hip flexor: 5/5 Hip abductors: 5/5  Gait unremarkable.    Impression and Recommendations:     This case required medical decision making of moderate complexity.

## 2015-09-29 NOTE — Progress Notes (Signed)
Pre visit review using our clinic review tool, if applicable. No additional management support is needed unless otherwise documented below in the visit note. 

## 2015-09-29 NOTE — Assessment & Plan Note (Signed)
Patient given a very low dose of Synthroid to add to her arm more. We discussed the differences in his medications and how they can have a synergistic effect. Patient is due for labs in 3 weeks and we'll have these done a primary care office.

## 2015-09-29 NOTE — Patient Instructions (Signed)
Good to see you.  When in pain Ice 20 minutes is better than heat.  With diet Drink 2 glasses of water immediately Then 1 glass with each meal before and after Eat something every 2-3 hours (nuts, yogurt etc)  About 100 calories at least.  Protein protein protein 1 gram per pound of body weight is goal.  Try my fitness pal Vitamins Vitamin D at least 2000 IU daily Iron 65mg  daily with 500mg  of vitamin C to help with absorption  Turmeric 500mg  twice daily Fish oil 2 grams daily Tart cherry extract at night  Exercises for the hamstring 3 times a week  Medications Synthroid 25mcg daily in addition to your armour.  See me again in 3 weeks.

## 2015-09-30 ENCOUNTER — Encounter: Payer: Self-pay | Admitting: Family Medicine

## 2015-10-19 ENCOUNTER — Ambulatory Visit: Payer: PPO | Admitting: Family Medicine

## 2015-10-26 ENCOUNTER — Encounter: Payer: Self-pay | Admitting: Family Medicine

## 2015-10-26 ENCOUNTER — Ambulatory Visit (INDEPENDENT_AMBULATORY_CARE_PROVIDER_SITE_OTHER): Payer: PPO | Admitting: Family Medicine

## 2015-10-26 VITALS — BP 134/80 | HR 87 | Ht 62.5 in | Wt 219.0 lb

## 2015-10-26 DIAGNOSIS — E079 Disorder of thyroid, unspecified: Secondary | ICD-10-CM

## 2015-10-26 DIAGNOSIS — G5701 Lesion of sciatic nerve, right lower limb: Secondary | ICD-10-CM | POA: Insufficient documentation

## 2015-10-26 DIAGNOSIS — M2550 Pain in unspecified joint: Secondary | ICD-10-CM

## 2015-10-26 NOTE — Patient Instructions (Signed)
Good to see you Continue the synthroid Change the vitamins Look for iron gluconate. And at least 37mg  daily with vitamin C DHEA 50 mg daily for 30 days  Ice is your friend Get back in the gym Wear brace at night See me again in 4-6 weeks Happy holidays!

## 2015-10-26 NOTE — Progress Notes (Signed)
[name] [address] [place], [place] [address] Phone: [phone] Subjective:    I'm seeing this patient by the request  of:  Rudi Heap, MD   CC: Arthralgia f/u  Allison Bell Allison Bell is a 64 y.o. female coming in with complaint of multiple pain.patient has had a long-standing history of this.  Patient was to do different exercises. Patient was also to take different vitamin supplementation including iron deficiency. Patient was continuing to increase her thyroid medication but we augmented it with some's Synthroid. Patient states that she is feeling significantly better. States that the fatigue is approximately 50% better. Continues to have the pain but would say that it is 25-35% less. Patient is feeling she can start to be a little bit more active. Feels like she has more mental clarity. Patient feels that the different diet changes she has made as well has been helpful. Happy with the results so far. Patient has noticed some mild right buttocks pain. No radiation.  Past Medical History  Diagnosis Date  . IBS (irritable bowel syndrome)   . Hypertension   . Thyroid disease    Past Surgical History  Procedure Laterality Date  . Abdominal hysterectomy    . Ovarian cyst removal    . Cesarean section     Social History  Substance Use Topics  . Smoking status: Never Smoker   . Smokeless tobacco: Never Used  . Alcohol Use: No   Allergies  Allergen Reactions  . Epinephrine     REACTION: heart pounds and races  . Penicillins     REACTION: swelling and rash  . Vancomycin     REACTION: head throbbing,heart raced, wave of heat throughout body   No family history on file.   reviewed patient's chart. Patient did have x-rays of the thoracic spine previously. 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 Right hip x-rays unremarkable for any arthritis or bony abnormality. Cervical  neck x-rays taken July 2014 showed multilevel degenerative disc disease. Reviewing patient's chart she has had multiple different x-rays of multiple joints. Past medical history, social, surgical and family history all reviewed in electronic medical record.   Review of Systems: No headache, visual changes, nausea, vomiting, diarrhea, constipation, dizziness, abdominal pain, skin rash, fevers, chills, night sweats, weight loss, swollen lymph nodes, body aches, joint swelling, muscle aches, chest pain, shortness of breath, mood changes.   Objective Blood pressure 134/80, pulse 87, height 5' 2.5" (1.588 m), weight 219 lb (99.338 kg), SpO2 97 %.  General: No apparent distress alert and oriented x3 mood and affect normal, dressed appropriately.  HEENT: Pupils equal, extraocular movements intact  Respiratory: Patient's speak in full sentences and does not appear short of breath  Cardiovascular: No lower extremity edema, non tender, no erythema  Skin: Warm dry intact with no signs of infection or rash on extremities or on axial skeleton.  Abdomen: Soft nontender  Neuro: Cranial nerves II through XII are intact, neurovascularly intact in all extremities with 2+ DTRs and 2+ pulses.  Lymph: No lymphadenopathy of posterior or anterior cervical chain or axillae bilaterally.  Gait normal with good balance and coordination.  MSK:  Non tender with full range of motion and good stability and symmetric strength and tone of shoulders, elbows, wrist, hip, knee and ankles bilaterally.  Continued tenderness of multiple joints.  Neck: Inspection unremarkable. No palpable stepoffs.patient is tender in the paraspinal musculature Negative Spurling's maneuver. Full neck  range of motion patient is tight in the extreme range of motion. Grip strength and sensation normal in bilateral hands Strength good C4 to T1 distribution No sensory change to C4 to T1 Negative Hoffman sign bilaterally Reflexes normal  Back  Exam:  Inspection: Unremarkable patient does have central obesity Motion: Flexion 45 deg, Extension 45 deg, Side Bending to 45 deg bilaterally,  Rotation to 45 deg bilaterally  SLR laying: Negative  XSLR laying: Negative  Continued discomfort in the paraspinal musculature but moderately improved. More pain over the right piriformis muscle. FABER: Positive Faber on right Sensory change: Gross sensation intact to all lumbar and sacral dermatomes.  Reflexes: 2+ at both patellar tendons, 2+ at achilles tendons, Babinski's downgoing.  Strength at foot  Plantar-flexion: 5/5 Dorsi-flexion: 5/5 Eversion: 5/5 Inversion: 5/5  Leg strength  Quad: 5/5 Hamstring: 5/5 Hip flexor: 5/5 Hip abductors: 5/5  Gait unremarkable.    Impression and Recommendations:     This case required medical decision making of moderate complexity.

## 2015-10-26 NOTE — Assessment & Plan Note (Signed)
I do think the patient's arthralgias from multiple different things. I do think there is some psychosomatic aspect as well with patient having some anxiety and stress. We discussed different changes in her vitamin supplementation. I do believe that patient's hypothyroidism was under control as well. Patient has started on the Synthroid and 25 g. Patient has noticed improvement in her energy level. There is a possibility we will need to go up to 50 g in 4 weeks. We will discuss at follow-up. Otherwise no drastic changes. We did discuss with patient having some mild tightness in the posterior aspect more over the piriformis patient given new exercises.

## 2015-10-26 NOTE — Assessment & Plan Note (Signed)
Continue current therapy 

## 2015-10-26 NOTE — Progress Notes (Signed)
Pre visit review using our clinic review tool, if applicable. No additional management support is needed unless otherwise documented below in the visit note. 

## 2015-10-26 NOTE — Assessment & Plan Note (Signed)

## 2015-11-09 ENCOUNTER — Encounter: Payer: Self-pay | Admitting: Family Medicine

## 2015-11-22 ENCOUNTER — Telehealth: Payer: Self-pay | Admitting: Family Medicine

## 2015-11-22 NOTE — Telephone Encounter (Signed)
denied °

## 2015-11-24 DIAGNOSIS — M47812 Spondylosis without myelopathy or radiculopathy, cervical region: Secondary | ICD-10-CM | POA: Diagnosis not present

## 2015-11-24 DIAGNOSIS — M5000 Cervical disc disorder with myelopathy, unspecified cervical region: Secondary | ICD-10-CM | POA: Diagnosis not present

## 2015-11-24 DIAGNOSIS — S53419A Radiohumeral (joint) sprain of unspecified elbow, initial encounter: Secondary | ICD-10-CM | POA: Diagnosis not present

## 2015-11-24 DIAGNOSIS — S336XXA Sprain of sacroiliac joint, initial encounter: Secondary | ICD-10-CM | POA: Diagnosis not present

## 2015-11-25 ENCOUNTER — Telehealth: Payer: Self-pay | Admitting: Family Medicine

## 2015-11-25 MED ORDER — LEVOTHYROXINE SODIUM 25 MCG PO TABS: 25.0000 ug | ORAL_TABLET | Freq: Every day | ORAL | Status: DC

## 2015-11-25 NOTE — Telephone Encounter (Signed)
Pt request refill for levothyroxine (LEVOTHROID) 25 MCG to be send to [facility] pharmacy. Please help

## 2015-11-25 NOTE — Telephone Encounter (Signed)
Refill done.  

## 2015-12-01 ENCOUNTER — Telehealth: Payer: Self-pay | Admitting: Family Medicine

## 2015-12-01 DIAGNOSIS — M47812 Spondylosis without myelopathy or radiculopathy, cervical region: Secondary | ICD-10-CM | POA: Diagnosis not present

## 2015-12-01 DIAGNOSIS — M5000 Cervical disc disorder with myelopathy, unspecified cervical region: Secondary | ICD-10-CM | POA: Diagnosis not present

## 2015-12-01 DIAGNOSIS — S336XXA Sprain of sacroiliac joint, initial encounter: Secondary | ICD-10-CM | POA: Diagnosis not present

## 2015-12-01 DIAGNOSIS — S53419A Radiohumeral (joint) sprain of unspecified elbow, initial encounter: Secondary | ICD-10-CM | POA: Diagnosis not present

## 2015-12-07 ENCOUNTER — Encounter: Payer: Self-pay | Admitting: Family Medicine

## 2015-12-07 ENCOUNTER — Ambulatory Visit (INDEPENDENT_AMBULATORY_CARE_PROVIDER_SITE_OTHER): Payer: PPO | Admitting: Family Medicine

## 2015-12-07 VITALS — BP 146/86 | HR 102 | Ht 62.5 in | Wt 223.0 lb

## 2015-12-07 DIAGNOSIS — E079 Disorder of thyroid, unspecified: Secondary | ICD-10-CM | POA: Diagnosis not present

## 2015-12-07 DIAGNOSIS — M2550 Pain in unspecified joint: Secondary | ICD-10-CM

## 2015-12-07 DIAGNOSIS — G5701 Lesion of sciatic nerve, right lower limb: Secondary | ICD-10-CM | POA: Diagnosis not present

## 2015-12-07 MED ORDER — DICLOFENAC SODIUM 2 % TD SOLN: TRANSDERMAL | Status: DC

## 2015-12-07 NOTE — Assessment & Plan Note (Signed)
Continue same medications at this time until labs are known.

## 2015-12-07 NOTE — Assessment & Plan Note (Signed)
Continues to give her difficulty. I discussed with patient about different interventions but patient declined. Once a continue to do the home exercises. Declined any type of injection or formal physical therapy at this time. We discussed core strengthening exercises and how this will be beneficial. Patient will come back and see me again in 4 weeks.

## 2015-12-07 NOTE — Progress Notes (Signed)
Allison Bell [address] [place], [place] [address] Phone: [phone] Subjective:      CC: Arthralgia f/u  [id number] Allison Bell is a 65 y.o. female coming in with complaint of multiple pain.patient has had a long-standing history of this.  Patient is continuing to try to be active. Patient does have some underlying anxiety she states that has made it difficult recently to stand track. Patient states that he has had some family issues. States that the increase in her syndrome medication has helped with some of the 15. Patient though still notices if she tries to do any activity out of her daily activity she has more muscle fatigue. Does have a lot of soreness when doing the exercises. Nothing that seems to be worse overall. Patient states that her neck seems to be tighter than usual. This is making it difficult to sleep on a regular basis. Patient has not tried all the over-the-counter medications secondary to being concern of side effects. Patient though states that now that the holidays are over she's got to take more time to try to help herself.  Past Medical History  Diagnosis Date  . IBS (irritable bowel syndrome)   . Hypertension   . Thyroid disease    Past Surgical History  Procedure Laterality Date  . Abdominal hysterectomy    . Ovarian cyst removal    . Cesarean section     Social History  Substance Use Topics  . Smoking status: Never Smoker   . Smokeless tobacco: Never Used  . Alcohol Use: No   Allergies  Allergen Reactions  . Epinephrine     REACTION: heart pounds and races  . Penicillins     REACTION: swelling and rash  . Vancomycin     REACTION: head throbbing,heart raced, wave of heat throughout body   No family history on file.   reviewed patient's chart. Patient did have x-rays of the thoracic spine previously. 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 Right  hip x-rays unremarkable for any arthritis or bony abnormality. Cervical neck x-rays taken July 2014 showed multilevel degenerative disc disease. Reviewing patient's chart she has had multiple different x-rays of multiple joints. Past medical history, social, surgical and family history all reviewed in electronic medical record.   Review of Systems: No headache, visual changes, nausea, vomiting, diarrhea, constipation, dizziness, abdominal pain, skin rash, fevers, chills, night sweats, weight loss, swollen lymph nodes, body aches, joint swelling, muscle aches, chest pain, shortness of breath, mood changes.   Objective Blood pressure 146/86, pulse 102, height 5' 2.5" (1.588 m), weight 223 lb (101.152 kg), SpO2 98 %.  General: No apparent distress alert and oriented x3 mood and affect normal, dressed appropriately.  HEENT: Pupils equal, extraocular movements intact  Respiratory: Patient's speak in full sentences and does not appear short of breath  Cardiovascular: No lower extremity edema, non tender, no erythema  Skin: Warm dry intact with no signs of infection or rash on extremities or on axial skeleton.  Abdomen: Soft nontender  Neuro: Cranial nerves II through XII are intact, neurovascularly intact in all extremities with 2+ DTRs and 2+ pulses.  Lymph: No lymphadenopathy of posterior or anterior cervical chain or axillae bilaterally.  Gait normal with good balance and coordination.  MSK:  Non tender with full range of motion and good stability and symmetric strength and tone of shoulders, elbows, wrist, hip, knee and ankles bilaterally.  Continued tenderness of  multiple joints.  Neck: Inspection unremarkable. No palpable stepoffs.patient is tender in the paraspinal musculature Negative Spurling's maneuver.patient though does have some tightness of the left side of the neck that is different than previous exam Full neck range of motion patient is tight in the extreme range of motion. Grip  strength and sensation normal in bilateral hands Strength good C4 to T1 distribution No sensory change to C4 to T1 Negative Hoffman sign bilaterally Reflexes normal  Back Exam:  Inspection: Unremarkable patient does have central obesity Motion: Flexion 45 deg, Extension 45 deg, Side Bending to 45 deg bilaterally,  Rotation to 45 deg bilaterally  SLR laying: Negative  XSLR laying: Negative  Continued discomfort in the paraspinal musculature but moderately improved. More pain over the right piriformis muscle. FABER: Positive Faber on right Sensory change: Gross sensation intact to all lumbar and sacral dermatomes.  Reflexes: 2+ at both patellar tendons, 2+ at achilles tendons, Babinski's downgoing.  Strength at foot  Plantar-flexion: 5/5 Dorsi-flexion: 5/5 Eversion: 5/5 Inversion: 5/5  Leg strength  Quad: 5/5 Hamstring: 5/5 Hip flexor: 5/5 Hip abductors: 5/5  Gait unremarkable.    Impression and Recommendations:     This case required medical decision making of moderate complexity.

## 2015-12-07 NOTE — Assessment & Plan Note (Signed)
Still think that this and likely multifactorial. We discussed with patient again at length. Patient is going to try the recommendations as seen in patient instructions. I do not want to change her thyroid medication at this time. We will to keep her at 25 mcgand only make one variable change at a time. Patient though is going to have her labs drawn by her primary care provider in the near future and we'll see how her thyroid medication is doing. Depending on that we may need to continue increase her Synthroid at follow-up. Adrenal insufficiency is in the differential and we will treat that with diet and natural supplements. Patient and will be coming back and see me again in 4 weeks to see how she responds.  Spent  25 minutes with patient face-to-face and had greater than 50% of counseling including as described above in assessment and plan.

## 2015-12-07 NOTE — Progress Notes (Signed)
Pre visit review using our clinic review tool, if applicable. No additional management support is needed unless otherwise documented below in the visit note. 

## 2015-12-07 NOTE — Patient Instructions (Signed)
Good to see you Lets try the DHEA 50 mg daily for next week and if not feeling good go down to  daily We will leave the synthroid where it is but may need to increase in the long run.  If you get labs then try to bring them with you On wall with heels, butt shoulder and head touching for a goal of 5 minutes daily  With sitting please put tennis ball between shoulder blades to help  pennsaid pinkie amount topically 2 times daily as needed.  See me again in 4 weeks.

## 2015-12-28 ENCOUNTER — Encounter: Payer: Self-pay | Admitting: Pediatrics

## 2015-12-28 ENCOUNTER — Ambulatory Visit (INDEPENDENT_AMBULATORY_CARE_PROVIDER_SITE_OTHER): Payer: PPO | Admitting: Pediatrics

## 2015-12-28 VITALS — BP 174/84 | HR 90 | Temp 98.6°F | Ht 62.5 in | Wt 220.8 lb

## 2015-12-28 DIAGNOSIS — Z1211 Encounter for screening for malignant neoplasm of colon: Secondary | ICD-10-CM

## 2015-12-28 DIAGNOSIS — I10 Essential (primary) hypertension: Secondary | ICD-10-CM | POA: Diagnosis not present

## 2015-12-28 DIAGNOSIS — Z1159 Encounter for screening for other viral diseases: Secondary | ICD-10-CM | POA: Diagnosis not present

## 2015-12-28 DIAGNOSIS — E8881 Metabolic syndrome: Secondary | ICD-10-CM

## 2015-12-28 DIAGNOSIS — Z6839 Body mass index (BMI) 39.0-39.9, adult: Secondary | ICD-10-CM | POA: Diagnosis not present

## 2015-12-28 DIAGNOSIS — K589 Irritable bowel syndrome without diarrhea: Secondary | ICD-10-CM | POA: Diagnosis not present

## 2015-12-28 DIAGNOSIS — Z0000 Encounter for general adult medical examination without abnormal findings: Secondary | ICD-10-CM | POA: Diagnosis not present

## 2015-12-28 DIAGNOSIS — E039 Hypothyroidism, unspecified: Secondary | ICD-10-CM | POA: Diagnosis not present

## 2015-12-28 DIAGNOSIS — Z638 Other specified problems related to primary support group: Secondary | ICD-10-CM | POA: Diagnosis not present

## 2015-12-28 DIAGNOSIS — F439 Reaction to severe stress, unspecified: Secondary | ICD-10-CM

## 2015-12-28 NOTE — Progress Notes (Signed)
Subjective:    Patient ID: Allison Bell, female    DOB: February 20, 1951, 65 y.o.   MRN: [id number]  CC: Follow up multiple med problems    HPI: Allison Bell is a 65 y.o. female presenting for multiple med problem f/u  Racing heart: has been better since stopping gluten.  Thyroid problems: has been on armour thyroid for a while  Chronic pain: neck and back, worse with stress  Lots of stress at home, getting better, event in the family last spring, now improving  Elevated BMI: slipped with diet for a while with extra stress.  Planning to go back to gym. Takes ibuprofen sometimes when neck/back pain is at its worst  Mammogram: Aug 2016 with Dr. Paula Compton Pap smears: s/p hysterectomy  Mood has been pretty stable even with extra stress. Needing a lot of patience with family. Allison Bell and family has been supportive.   Depression screen [facility] 2/9 12/28/2015 08/19/2015  Decreased Interest 0 0  Down, Depressed, Hopeless 0 0  PHQ - 2 Score 0 0     Relevant past medical, surgical, family and social history reviewed and updated as indicated. Interim medical history since our last visit reviewed. Allergies and medications reviewed and updated.  ROS: All systems negative other than what is in HPI  History  Smoking status  . Never Smoker   Smokeless tobacco  . Never Used    Past Medical History Patient Active Problem List   Diagnosis Date Noted  . Screening for colon cancer 12/28/2015  . Piriformis syndrome of right side 10/26/2015  . Arthralgia 09/29/2015  . Thyroid disease   . Metabolic syndrome 63/78/5885  . Abnormal EKG 03/07/2011  . Heart murmur 03/07/2011  . IBS (irritable bowel syndrome)   . Hypertension   . Hypothyroidism 06/12/2010  . MORBID OBESITY 06/12/2010       Objective:    BP 174/84 mmHg  Pulse 90  Temp(Src) 98.6 F (37 C) (Oral)  Ht 5' 2.5" (1.588 m)  Wt 220 lb 12.8 oz (100.154 kg)  BMI 39.72 kg/m2  Wt Readings from Last 3  Encounters:  12/28/15 220 lb 12.8 oz (100.154 kg)  12/07/15 223 lb (101.152 kg)  10/26/15 219 lb (99.338 kg)     Gen: NAD, alert, cooperative with exam, NCAT EYES: EOMI, no scleral injection or icterus ENT:  TMs pearly gray b/l, OP without erythema LYMPH: no cervical LAD CV: NRRR, normal S1/S2, no murmur, distal pulses 2+ b/l Resp: CTABL, no wheezes, normal WOB Abd: +BS, soft, NTND.  Ext: No edema, warm Neuro: Alert and oriented, strength equal b/l UE and LE, coordination grossly normal MSK: normal muscle bulk Psych: full affect, no thoughts of self harm     Assessment & Plan:    Allison Bell was seen today for multiple med problems.  Diagnoses and all orders for this visit:  BMI 39.0-39.9,adult Discussed lifestyle changes, increasing physical activity -     Lipid panel -     CMP14+EGFR  Screening for colon cancer Over due for colonoscopy, pt has phone number, will call to schedule.  Need for hepatitis C screening test -     Hepatitis C antibody  Hypothyroidism, unspecified hypothyroidism type On both levothyroxine and armour thyroid now. -     TSH  Stress at home Improving. Continue stress relief techniques. -     CBC  Essential hypertension Elevated today. Not elevated at home. Will check regularly, come back in 4 weeks with home numbers. -  CMP14+EGFR  IBS (irritable bowel syndrome) Continue diet, avoiding certain foods.  Metabolic syndrome Repeat today. -     Lipid panel   Follow up plan: F/u BP in 2 weeks  Assunta Found, MD [facility] 12/28/2015, 10:25 AM

## 2015-12-28 NOTE — Patient Instructions (Signed)
Ibuprofen 400-600mg  three times a day with food as needed for pain  Anacreme or asper cream for neck pain and back pain as needed

## 2015-12-29 DIAGNOSIS — S53419A Radiohumeral (joint) sprain of unspecified elbow, initial encounter: Secondary | ICD-10-CM | POA: Diagnosis not present

## 2015-12-29 DIAGNOSIS — M47812 Spondylosis without myelopathy or radiculopathy, cervical region: Secondary | ICD-10-CM | POA: Diagnosis not present

## 2015-12-29 DIAGNOSIS — M5000 Cervical disc disorder with myelopathy, unspecified cervical region: Secondary | ICD-10-CM | POA: Diagnosis not present

## 2015-12-29 DIAGNOSIS — S336XXA Sprain of sacroiliac joint, initial encounter: Secondary | ICD-10-CM | POA: Diagnosis not present

## 2015-12-29 LAB — CBC
HEMOGLOBIN: 15 g/dL (ref 11.1–15.9)
Hematocrit: 44.3 % (ref 34.0–46.6)
MCH: 28.3 pg (ref 26.6–33.0)
MCHC: 33.9 g/dL (ref 31.5–35.7)
MCV: 84 fL (ref 79–97)
PLATELETS: 292 10*3/uL (ref 150–379)
RBC: 5.3 x10E6/uL — AB (ref 3.77–5.28)
RDW: 13.5 % (ref 12.3–15.4)
WBC: 11.3 10*3/uL — AB (ref 3.4–10.8)

## 2015-12-29 LAB — HEPATITIS C ANTIBODY: Hep C Virus Ab: <0.1 s/co ratio (ref 0.0–0.9)

## 2015-12-29 LAB — CMP14+EGFR
A/G RATIO: 2.3 (ref 1.1–2.5)
ALK PHOS: 65 IU/L (ref 39–117)
ALT: 22 IU/L (ref 0–32)
AST: 25 IU/L (ref 0–40)
Albumin: 4.8 g/dL (ref 3.6–4.8)
BILIRUBIN TOTAL: 0.2 mg/dL (ref 0.0–1.2)
BUN/Creatinine Ratio: 20 (ref 11–26)
BUN: 10 mg/dL (ref 8–27)
CALCIUM: 9.5 mg/dL (ref 8.7–10.3)
CHLORIDE: 100 mmol/L (ref 96–106)
CO2: 21 mmol/L (ref 18–29)
Creatinine, Ser: 0.49 mg/dL — ABNORMAL LOW (ref 0.57–1.00)
GFR calc Af Amer: 118 mL/min/{1.73_m2} (ref >59)
GFR, EST NON AFRICAN AMERICAN: 103 mL/min/{1.73_m2} (ref >59)
Globulin, Total: 2.1 g/dL (ref 1.5–4.5)
Glucose: 99 mg/dL (ref 65–99)
POTASSIUM: 5.1 mmol/L (ref 3.5–5.2)
SODIUM: 143 mmol/L (ref 134–144)
Total Protein: 6.9 g/dL (ref 6.0–8.5)

## 2015-12-29 LAB — TSH: TSH: 1.83 u[IU]/mL (ref 0.450–4.500)

## 2015-12-29 LAB — LIPID PANEL
CHOL/HDL RATIO: 5 ratio — AB (ref 0.0–4.4)
Cholesterol, Total: 253 mg/dL — ABNORMAL HIGH (ref 100–199)
HDL: 51 mg/dL (ref >39)
LDL CALC: 157 mg/dL — AB (ref 0–99)
TRIGLYCERIDES: 225 mg/dL — AB (ref 0–149)
VLDL CHOLESTEROL CAL: 45 mg/dL — AB (ref 5–40)

## 2016-01-04 ENCOUNTER — Ambulatory Visit: Payer: PPO | Admitting: Family Medicine

## 2016-01-10 ENCOUNTER — Encounter: Payer: Self-pay | Admitting: Family Medicine

## 2016-01-10 ENCOUNTER — Ambulatory Visit (INDEPENDENT_AMBULATORY_CARE_PROVIDER_SITE_OTHER): Payer: PPO | Admitting: Family Medicine

## 2016-01-10 VITALS — BP 126/84 | HR 92 | Ht 62.5 in | Wt 221.0 lb

## 2016-01-10 DIAGNOSIS — M2550 Pain in unspecified joint: Secondary | ICD-10-CM

## 2016-01-10 NOTE — Patient Instructions (Signed)
Good to see you  You are doing great and need to give yourself a pat on the back DHEA  daily for 4 weeks then off 2 weeks Cholesterol is high and we will need to get going.  Continue the other vitamins  Armour does come in  so see if they can get you that  Check blood pressure 2-3 times a week and keep a journal to make sure it is not going up See me again in 6 weeks

## 2016-01-10 NOTE — Progress Notes (Signed)
[name] [address] [place], [place] [address] Phone: [phone] Subjective:      CC: Arthralgia f/u  [id number] Allison Bell is a 65 y.o. female coming in with complaint of multiple pain.patient has had a long-standing history of this.  Patient was treated for more of a chronic fatigue, arthralgia as well as or thyroid disease. Patient had some hypertension with the low dose of Synthroid added to her other medication. Patient discontinued that in her blood pressure is normal today. Patient's states that when she was on the DHEA all of her pain seemed to be significantly improved. Patient states that now that she has been off of it for 3 weeks she is starting to have increasing discomfort again. Wondering if he would be safe for her to go back on it. Denies any significant back pain. Still significantly better than what she was doing prior to her initial visit. Patient denies any new symptoms. Just some mild worsening of previous symptoms.  Past Medical History  Diagnosis Date  . IBS (irritable bowel syndrome)   . Hypertension   . Thyroid disease    Past Surgical History  Procedure Laterality Date  . Abdominal hysterectomy    . Ovarian cyst removal    . Cesarean section     Social History  Substance Use Topics  . Smoking status: Never Smoker   . Smokeless tobacco: Never Used  . Alcohol Use: No   Allergies  Allergen Reactions  . Epinephrine     REACTION: heart pounds and races  . Penicillins     REACTION: swelling and rash  . Vancomycin     REACTION: head throbbing,heart raced, wave of heat throughout body   No family history on file.denies any family history of rheumatological diseases.  mily history all reviewed in electronic medical record.   Review of Systems: No headache, visual changes, nausea, vomiting, diarrhea, constipation, dizziness, abdominal pain, skin rash, fevers, chills, night sweats, weight loss, swollen lymph  nodes, body aches, joint swelling, muscle aches, chest pain, shortness of breath, mood changes.   Objective Blood pressure 126/84, pulse 92, height 5' 2.5" (1.588 m), weight 221 lb (100.245 kg), SpO2 99 %.  General: No apparent distress alert and oriented x3 mood and affect normal, dressed appropriately.  HEENT: Pupils equal, extraocular movements intact  Respiratory: Patient's speak in full sentences and does not appear short of breath  Cardiovascular: No lower extremity edema, non tender, no erythema  Skin: Warm dry intact with no signs of infection or rash on extremities or on axial skeleton.  Abdomen: Soft nontender  Neuro: Cranial nerves II through XII are intact, neurovascularly intact in all extremities with 2+ DTRs and 2+ pulses.  Lymph: No lymphadenopathy of posterior or anterior cervical chain or axillae bilaterally.  Gait normal with good balance and coordination.  MSK:  Non tender with full range of motion and good stability and symmetric strength and tone of shoulders, elbows, wrist, hip, knee and ankles bilaterally.  Continued tenderness of multiple joints.  Neck: Inspection unremarkable. No palpable stepoffs.patient is tender in the paraspinal musculature Negative Spurling's maneuver. Full neck range of motion  Grip strength and sensation normal in bilateral hands Strength good C4 to T1 distribution No sensory change to C4 to T1 Negative Hoffman sign bilaterally Reflexes normal  Back Exam:  Inspection: Unremarkable patient does have central obesitystill present Motion: Flexion 45 deg, Extension 25 deg, Side Bending to 45 deg bilaterally,  Rotation to 45  deg bilaterally  SLR laying: Negative  XSLR laying: Negative  Very mild discomfort of the paraspinal musculature to palpation. FABER: Positive Faber on right Sensory change: Gross sensation intact to all lumbar and sacral dermatomes.  Reflexes: 2+ at both patellar tendons, 2+ at achilles tendons, Babinski's  downgoing.  Strength at foot  Plantar-flexion: 5/5 Dorsi-flexion: 5/5 Eversion: 5/5 Inversion: 5/5  Leg strength  Quad: 5/5 Hamstring: 5/5 Hip flexor: 5/5 Hip abductors: 4/5 but symmetric Gait unremarkable.    Impression and Recommendations:     This case required medical decision making of moderate complexity.

## 2016-01-10 NOTE — Progress Notes (Signed)
Pre visit review using our clinic review tool, if applicable. No additional management support is needed unless otherwise documented below in the visit note. 

## 2016-01-10 NOTE — Assessment & Plan Note (Signed)
Discussed with patient again at great length. I do believe that some of this could be underlying thyroid disease that is not being properly treated. Patient will be following up with her primary care provider to discuss in greater detail. We discussed again about different medications. Patient is on arm or 45 mg daily. We discussed with the potential side effect of this levothyroxine to discontinue that. Patient did respond well to the DHEA. At this point we will do cycles a 4 week intervals. We discussed possible side effects. Patient was feeling significantly better. Treat with insufficiency is been within the differential.  Encourage her to be more active. Patient started on exercise prescription today. We will see how patient response. Patient will come back and see me again in 6 weeks for further evaluation and treatment.Spent  25 minutes with patient face-to-face and had greater than 50% of counseling including as described above in assessment and plan.

## 2016-01-18 ENCOUNTER — Encounter: Payer: Self-pay | Admitting: Pediatrics

## 2016-01-18 ENCOUNTER — Ambulatory Visit (INDEPENDENT_AMBULATORY_CARE_PROVIDER_SITE_OTHER): Payer: PPO | Admitting: Pediatrics

## 2016-01-18 VITALS — BP 171/79 | HR 96 | Temp 98.7°F | Ht 62.5 in | Wt 222.0 lb

## 2016-01-18 DIAGNOSIS — Z1211 Encounter for screening for malignant neoplasm of colon: Secondary | ICD-10-CM | POA: Diagnosis not present

## 2016-01-18 DIAGNOSIS — E785 Hyperlipidemia, unspecified: Secondary | ICD-10-CM | POA: Diagnosis not present

## 2016-01-18 DIAGNOSIS — E039 Hypothyroidism, unspecified: Secondary | ICD-10-CM

## 2016-01-18 DIAGNOSIS — I10 Essential (primary) hypertension: Secondary | ICD-10-CM

## 2016-01-18 MED ORDER — THYROID 15 MG PO TABS: 45.0000 mg | ORAL_TABLET | Freq: Every day | ORAL | Status: DC

## 2016-01-18 MED ORDER — HYDROCHLOROTHIAZIDE 12.5 MG PO TABS: 12.5000 mg | ORAL_TABLET | Freq: Every day | ORAL | Status: DC

## 2016-01-18 MED ORDER — PRAVASTATIN SODIUM 20 MG PO TABS: 20.0000 mg | ORAL_TABLET | Freq: Every day | ORAL | Status: DC

## 2016-01-18 NOTE — Progress Notes (Signed)
Subjective:    Patient ID: Allison Bell, female    DOB: 06-13-1951, 65 y.o.   MRN: [id number]  CC: Follow-up multiple med problems  HPI: Allison Bell is a 65 y.o. female presenting for Follow-up  Saw Dr. Katrinka Blazing for pain in legs Levothyroxine briefly recently, stopped it because made her feel funny On  of armour thyroid daily Has had a lot of trouble per pt controlling thyroid levels Recently having more "shakes" at night, has thought it was related to thyroid No heart palpitations  Otherwise feeling well Stress at home ongoing but improving No HA, vision changes  Had Mammogram done in Sept 2016 at [facility] office   Depression screen [facility] 2/9 01/18/2016 12/28/2015 08/19/2015  Decreased Interest 0 0 0  Down, Depressed, Hopeless 0 0 0  PHQ - 2 Score 0 0 0     Relevant past medical, surgical, family and social history reviewed and updated as indicated. Interim medical history since our last visit reviewed. Allergies and medications reviewed and updated.    ROS: Per HPI unless specifically indicated above  History  Smoking status  . Never Smoker   Smokeless tobacco  . Never Used    Past Medical History Patient Active Problem List   Diagnosis Date Noted  . Hyperlipidemia 01/21/2016  . Screening for colon cancer 12/28/2015  . Piriformis syndrome of right side 10/26/2015  . Arthralgia 09/29/2015  . Thyroid disease   . Metabolic syndrome 06/01/2013  . Abnormal EKG 03/07/2011  . Heart murmur 03/07/2011  . IBS (irritable bowel syndrome)   . Hypertension   . Hypothyroidism 06/12/2010  . MORBID OBESITY 06/12/2010    Current Outpatient Prescriptions  Medication Sig Dispense Refill  . Lancets Misc. MISC by Does not apply route. Reported on 12/28/2015    . hydrochlorothiazide (HYDRODIURIL) 12.5 MG tablet Take 1 tablet (12.5 mg total) by mouth daily. 30 tablet 5  . pravastatin (PRAVACHOL) 20 MG tablet Take 1 tablet (20 mg total) by mouth  daily. 30 tablet 3  . thyroid (ARMOUR THYROID) 15 MG tablet Take 3 tablets (45 mg total) by mouth daily. 90 tablet 4   No current facility-administered medications for this visit.       Objective:    BP 171/79 mmHg  Pulse 96  Temp(Src) 98.7 F (37.1 C) (Oral)  Ht 5' 2.5" (1.588 m)  Wt 222 lb (100.699 kg)  BMI 39.93 kg/m2  Wt Readings from Last 3 Encounters:  01/18/16 222 lb (100.699 kg)  01/10/16 221 lb (100.245 kg)  12/28/15 220 lb 12.8 oz (100.154 kg)    Gen: NAD, alert, cooperative with exam, NCAT EYES: EOMI, no scleral injection or icterus ENT:  OP without erythema LYMPH: no cervical LAD CV: NRRR, normal S1/S2, no murmur, distal pulses 2+ b/l Resp: CTABL, no wheezes, normal WOB Abd: +BS, soft, NTND.  Ext: No edema, warm Neuro: Alert and oriented, strength equal b/l UE and LE, coordination grossly normal MSK: normal muscle bulk     Assessment & Plan:    Kamirah was seen today for follow-up multiple med problems.  Diagnoses and all orders for this visit:  Essential hypertension BP remains elevated, start below -     hydrochlorothiazide (HYDRODIURIL) 12.5 MG tablet; Take 1 tablet (12.5 mg total) by mouth daily.  Hypothyroidism, unspecified hypothyroidism type Pt wants further thyroid testing. Follows at separate clinic. Discussed difficulty in interpreting below, the irregularity armour thyroid can give with thyroid hormone per pill. Pt wants to  continue armour thyroid for now.  -     TSH -     thyroid (ARMOUR THYROID) 15 MG tablet; Take 3 tablets (45 mg total) by mouth daily. -     T4 -     T4, free -     T3, free -     T3 -     T3 uptake -     T3, reverse  Screening for colon cancer Gave FIT cards  Hyperlipidemia Start pravastatin. OK to wait two weeks after starting BP med, pt nervous about starting two meds at same time. -     pravastatin (PRAVACHOL) 20 MG tablet; Take 1 tablet (20 mg total) by mouth daily.    Follow up plan: 4 weeks for BP  check  Rex Kras, MD [facility] 01/18/2016, 12:19 PM

## 2016-01-21 DIAGNOSIS — E785 Hyperlipidemia, unspecified: Secondary | ICD-10-CM | POA: Insufficient documentation

## 2016-01-21 LAB — T3, FREE: T3 FREE: 3.3 pg/mL (ref 2.0–4.4)

## 2016-01-21 LAB — T3 UPTAKE
FREE THYROXINE INDEX: 2 (ref 1.2–4.9)
T3 Uptake Ratio: 24 % (ref 24–39)

## 2016-01-21 LAB — TSH: TSH: 2.09 u[IU]/mL (ref 0.450–4.500)

## 2016-01-21 LAB — T4, FREE: FREE T4: 1.1 ng/dL (ref 0.82–1.77)

## 2016-01-21 LAB — T4: T4, Total: 8.3 ug/dL (ref 4.5–12.0)

## 2016-01-21 LAB — T3: T3, Total: 134 ng/dL (ref 71–180)

## 2016-01-21 LAB — T3, REVERSE: Reverse T3, Serum: 17.5 ng/dL (ref 9.2–24.1)

## 2016-01-26 DIAGNOSIS — S53419A Radiohumeral (joint) sprain of unspecified elbow, initial encounter: Secondary | ICD-10-CM | POA: Diagnosis not present

## 2016-01-26 DIAGNOSIS — M5000 Cervical disc disorder with myelopathy, unspecified cervical region: Secondary | ICD-10-CM | POA: Diagnosis not present

## 2016-01-26 DIAGNOSIS — M47812 Spondylosis without myelopathy or radiculopathy, cervical region: Secondary | ICD-10-CM | POA: Diagnosis not present

## 2016-01-26 DIAGNOSIS — S336XXA Sprain of sacroiliac joint, initial encounter: Secondary | ICD-10-CM | POA: Diagnosis not present

## 2016-02-07 ENCOUNTER — Encounter: Payer: Self-pay | Admitting: *Deleted

## 2016-02-21 ENCOUNTER — Encounter: Payer: Self-pay | Admitting: Family Medicine

## 2016-02-21 ENCOUNTER — Ambulatory Visit (INDEPENDENT_AMBULATORY_CARE_PROVIDER_SITE_OTHER): Payer: PPO | Admitting: Family Medicine

## 2016-02-21 ENCOUNTER — Other Ambulatory Visit (INDEPENDENT_AMBULATORY_CARE_PROVIDER_SITE_OTHER): Payer: PPO

## 2016-02-21 DIAGNOSIS — E079 Disorder of thyroid, unspecified: Secondary | ICD-10-CM | POA: Diagnosis not present

## 2016-02-21 DIAGNOSIS — M2550 Pain in unspecified joint: Secondary | ICD-10-CM

## 2016-02-21 LAB — CBC WITH DIFFERENTIAL/PLATELET
BASOS PCT: 0.5 % (ref 0.0–3.0)
Basophils Absolute: 0.1 10*3/uL (ref 0.0–0.1)
EOS ABS: 0.2 10*3/uL (ref 0.0–0.7)
Eosinophils Relative: 1.5 % (ref 0.0–5.0)
HCT: 42.4 % (ref 36.0–46.0)
HEMOGLOBIN: 14.4 g/dL (ref 12.0–15.0)
Lymphocytes Relative: 21 % (ref 12.0–46.0)
Lymphs Abs: 2.5 10*3/uL (ref 0.7–4.0)
MCHC: 33.9 g/dL (ref 30.0–36.0)
MCV: 84.5 fl (ref 78.0–100.0)
MONO ABS: 0.8 10*3/uL (ref 0.1–1.0)
Monocytes Relative: 7 % (ref 3.0–12.0)
Neutro Abs: 8.5 10*3/uL — ABNORMAL HIGH (ref 1.4–7.7)
Neutrophils Relative %: 70 % (ref 43.0–77.0)
Platelets: 315 10*3/uL (ref 150.0–400.0)
RBC: 5.02 Mil/uL (ref 3.87–5.11)
RDW: 13 % (ref 11.5–15.5)
WBC: 12.1 10*3/uL — AB (ref 4.0–10.5)

## 2016-02-21 LAB — C-REACTIVE PROTEIN: CRP: 0.9 mg/dL (ref 0.5–20.0)

## 2016-02-21 LAB — SEDIMENTATION RATE: Sed Rate: 10 mm/hr (ref 0–22)

## 2016-02-21 LAB — URIC ACID: Uric Acid, Serum: 5.2 mg/dL (ref 2.4–7.0)

## 2016-02-21 LAB — VITAMIN D 25 HYDROXY (VIT D DEFICIENCY, FRACTURES): VITD: 63.23 ng/mL (ref 30.00–100.00)

## 2016-02-21 NOTE — Progress Notes (Signed)
[facility] [address] [place], [place] [address] Phone: [phone] Subjective:      CC: Arthralgia f/u  OZH:[id number]:Subjective Allison BattyYvonne S Bell is a 65 y.o. female coming in with complaint of multiple pain.patient has had a long-standing history of this.  Patient was treated for more of a chronic fatigue, arthralgia as well as or thyroid disease.  Patient is on medications for the thyroid now as well as for adrenal fatigue. Patient was making significant progress. Was noticing more energy. Less pain. Patient was to start increasing her activity. Patient states Still frustrated that she did not lose weight. Sometimes has good days and other days she has been days. Continues on the vitamins. States that when she has taken the DHEA she seems to be feeling better. Thyroid medication is made some mild improvement as well. Continues to have soreness that seems to be different joints on different days.  Past Medical History  Diagnosis Date  . IBS (irritable bowel syndrome)   . Hypertension   . Thyroid disease    Past Surgical History  Procedure Laterality Date  . Abdominal hysterectomy    . Ovarian cyst removal    . Cesarean section     Social History  Substance Use Topics  . Smoking status: Never Smoker   . Smokeless tobacco: Never Used  . Alcohol Use: No   Allergies  Allergen Reactions  . Epinephrine     REACTION: heart pounds and races  . Penicillins     REACTION: swelling and rash  . Vancomycin     REACTION: head throbbing,heart raced, wave of heat throughout body   No family history on file.denies any family history of rheumatological diseases.  mily history all reviewed in electronic medical record.   Review of Systems: No headache, visual changes, nausea, vomiting, diarrhea, constipation, dizziness, abdominal pain, skin rash, fevers, chills, night sweats, weight loss, swollen lymph nodes, chest pain, shortness of breath, mood changes.    Objective There were no vitals taken for this visit.  General: No apparent distress alert and oriented x3 mood and affect normal, dressed appropriately.  HEENT: Pupils equal, extraocular movements intact  Respiratory: Patient's speak in full sentences and does not appear short of breath  Cardiovascular: No lower extremity edema, non tender, no erythema  Skin: Warm dry intact with no signs of infection or rash on extremities or on axial skeleton.  Abdomen: Soft nontender  Neuro: Cranial nerves II through XII are intact, neurovascularly intact in all extremities with 2+ DTRs and 2+ pulses.  Lymph: No lymphadenopathy of posterior or anterior cervical chain or axillae bilaterally.  Gait normal with good balance and coordination.  MSK:  Non tender with full range of motion and good stability and symmetric strength and tone of shoulders, elbows, wrist, hip, knee and ankles bilaterally.  Continued tenderness of multiple joints.  Neck: Inspection unremarkable. No palpable stepoffs.patient is tender in the paraspinal musculature Negative Spurling's maneuver. Full neck range of motion  Grip strength and sensation normal in bilateral hands Strength good C4 to T1 distribution No sensory change to C4 to T1 Negative Hoffman sign bilaterally Reflexes normal  Back Exam:  Inspection: Unremarkable patient does have central obesitystill present Motion: Flexion 45 deg, Extension 25 deg, Side Bending to 45 deg bilaterally,  Rotation to 45 deg bilaterally  SLR laying: Negative  XSLR laying: Negative  Very mild discomfort of the paraspinal musculature to palpation. FABER: Positive Faber on right Sensory change: Gross sensation intact  to all lumbar and sacral dermatomes.  Reflexes: 2+ at both patellar tendons, 2+ at achilles tendons, Babinski's downgoing.  Strength at foot  Plantar-flexion: 5/5 Dorsi-flexion: 5/5 Eversion: 5/5 Inversion: 5/5  Leg strength  Quad: 5/5 Hamstring: 5/5 Hip flexor: 5/5  Hip abductors: 4/5 but symmetric Gait unremarkable. No change in physical exam    Impression and Recommendations:     This case required medical decision making of moderate complexity.

## 2016-02-21 NOTE — Assessment & Plan Note (Signed)
Encouraged patient to continue same dose at this time. Recheck in 3 months.

## 2016-02-21 NOTE — Assessment & Plan Note (Signed)
Patient continues to have difficult. This time. Patient's tenderness and some mild non-compliance as well. We discussed icing regimen and home exercises. Patient is going to have some labs done as well to rule out any other autoimmune diseases a could be contribute in. We discussed which over-the-counter medications and has been beneficial and continued do when she needs to. Discussed different diet changes that I think will be beneficial. Patient wants to go see a possible integrated medicine doc and we discussed that she can do that. At this point we have not on any other significant findings that make me significantly concerned that depending on results this may change medical management.  Spent  25 minutes with patient face-to-face and had greater than 50% of counseling including as described above in assessment and plan.

## 2016-02-21 NOTE — Assessment & Plan Note (Signed)
Encourage weight loss. 

## 2016-02-21 NOTE — Patient Instructions (Addendum)
Good to see you  Ice when you need it.  We are going to get some more labs to rule everything else out.  I like where we are on the thyroid and would not change anything I want you to try to increase omega 3 foods and decrease omega 6 foods.  Would do another 30 days on DHEA Consider adding the tart cherry extract  I will release the labs in my chart when I get them and we will see what to do otherwise See me again in 6 weeks.  Keep it up you have come a long way.

## 2016-02-22 LAB — CYCLIC CITRUL PEPTIDE ANTIBODY, IGG

## 2016-02-22 LAB — ANTI-DNA ANTIBODY, DOUBLE-STRANDED: ds DNA Ab: <1 IU/mL

## 2016-02-22 LAB — ANA: Anti Nuclear Antibody(ANA): NEGATIVE

## 2016-02-23 DIAGNOSIS — M9906 Segmental and somatic dysfunction of lower extremity: Secondary | ICD-10-CM | POA: Diagnosis not present

## 2016-02-23 DIAGNOSIS — M9904 Segmental and somatic dysfunction of sacral region: Secondary | ICD-10-CM | POA: Diagnosis not present

## 2016-02-23 DIAGNOSIS — M9902 Segmental and somatic dysfunction of thoracic region: Secondary | ICD-10-CM | POA: Diagnosis not present

## 2016-02-23 DIAGNOSIS — M5124 Other intervertebral disc displacement, thoracic region: Secondary | ICD-10-CM | POA: Diagnosis not present

## 2016-02-23 DIAGNOSIS — M9907 Segmental and somatic dysfunction of upper extremity: Secondary | ICD-10-CM | POA: Diagnosis not present

## 2016-02-23 DIAGNOSIS — S93629A Sprain of tarsometatarsal ligament of unspecified foot, initial encounter: Secondary | ICD-10-CM | POA: Diagnosis not present

## 2016-02-23 DIAGNOSIS — M9903 Segmental and somatic dysfunction of lumbar region: Secondary | ICD-10-CM | POA: Diagnosis not present

## 2016-02-23 DIAGNOSIS — M9901 Segmental and somatic dysfunction of cervical region: Secondary | ICD-10-CM | POA: Diagnosis not present

## 2016-02-23 DIAGNOSIS — M47817 Spondylosis without myelopathy or radiculopathy, lumbosacral region: Secondary | ICD-10-CM | POA: Diagnosis not present

## 2016-02-23 DIAGNOSIS — S63529A Sprain of radiocarpal joint of unspecified wrist, initial encounter: Secondary | ICD-10-CM | POA: Diagnosis not present

## 2016-03-22 DIAGNOSIS — M47817 Spondylosis without myelopathy or radiculopathy, lumbosacral region: Secondary | ICD-10-CM | POA: Diagnosis not present

## 2016-03-22 DIAGNOSIS — S63529A Sprain of radiocarpal joint of unspecified wrist, initial encounter: Secondary | ICD-10-CM | POA: Diagnosis not present

## 2016-03-22 DIAGNOSIS — M9902 Segmental and somatic dysfunction of thoracic region: Secondary | ICD-10-CM | POA: Diagnosis not present

## 2016-03-22 DIAGNOSIS — M9907 Segmental and somatic dysfunction of upper extremity: Secondary | ICD-10-CM | POA: Diagnosis not present

## 2016-03-22 DIAGNOSIS — M9901 Segmental and somatic dysfunction of cervical region: Secondary | ICD-10-CM | POA: Diagnosis not present

## 2016-03-22 DIAGNOSIS — M5124 Other intervertebral disc displacement, thoracic region: Secondary | ICD-10-CM | POA: Diagnosis not present

## 2016-03-22 DIAGNOSIS — M9904 Segmental and somatic dysfunction of sacral region: Secondary | ICD-10-CM | POA: Diagnosis not present

## 2016-03-22 DIAGNOSIS — M9906 Segmental and somatic dysfunction of lower extremity: Secondary | ICD-10-CM | POA: Diagnosis not present

## 2016-03-22 DIAGNOSIS — S93629A Sprain of tarsometatarsal ligament of unspecified foot, initial encounter: Secondary | ICD-10-CM | POA: Diagnosis not present

## 2016-03-22 DIAGNOSIS — M9903 Segmental and somatic dysfunction of lumbar region: Secondary | ICD-10-CM | POA: Diagnosis not present

## 2016-04-03 ENCOUNTER — Ambulatory Visit (INDEPENDENT_AMBULATORY_CARE_PROVIDER_SITE_OTHER): Payer: PPO | Admitting: Family Medicine

## 2016-04-03 ENCOUNTER — Encounter: Payer: Self-pay | Admitting: Family Medicine

## 2016-04-03 ENCOUNTER — Other Ambulatory Visit (INDEPENDENT_AMBULATORY_CARE_PROVIDER_SITE_OTHER): Payer: PPO

## 2016-04-03 VITALS — BP 134/78 | HR 84 | Ht 62.5 in | Wt 222.0 lb

## 2016-04-03 DIAGNOSIS — M2550 Pain in unspecified joint: Secondary | ICD-10-CM | POA: Diagnosis not present

## 2016-04-03 LAB — CBC WITH DIFFERENTIAL/PLATELET
BASOS PCT: 0.5 % (ref 0.0–3.0)
Basophils Absolute: 0 10*3/uL (ref 0.0–0.1)
EOS PCT: 1.9 % (ref 0.0–5.0)
Eosinophils Absolute: 0.2 10*3/uL (ref 0.0–0.7)
HEMATOCRIT: 40.8 % (ref 36.0–46.0)
HEMOGLOBIN: 13.9 g/dL (ref 12.0–15.0)
Lymphocytes Relative: 26.2 % (ref 12.0–46.0)
Lymphs Abs: 2.7 10*3/uL (ref 0.7–4.0)
MCHC: 34 g/dL (ref 30.0–36.0)
MCV: 83.7 fl (ref 78.0–100.0)
MONO ABS: 0.6 10*3/uL (ref 0.1–1.0)
Monocytes Relative: 6.1 % (ref 3.0–12.0)
NEUTROS ABS: 6.7 10*3/uL (ref 1.4–7.7)
Neutrophils Relative %: 65.3 % (ref 43.0–77.0)
PLATELETS: 296 10*3/uL (ref 150.0–400.0)
RBC: 4.88 Mil/uL (ref 3.87–5.11)
RDW: 13.2 % (ref 11.5–15.5)
WBC: 10.2 10*3/uL (ref 4.0–10.5)

## 2016-04-03 LAB — IBC PANEL
Iron: 64 ug/dL (ref 42–145)
Saturation Ratios: 18.1 % — ABNORMAL LOW (ref 20.0–50.0)
Transferrin: 253 mg/dL (ref 212.0–360.0)

## 2016-04-03 LAB — VITAMIN B12: VITAMIN B 12: 801 pg/mL (ref 211–911)

## 2016-04-03 LAB — FERRITIN: Ferritin: 45.1 ng/mL (ref 10.0–291.0)

## 2016-04-03 NOTE — Patient Instructions (Addendum)
Good to see you  Ice is your friend DHEA 4 weeks on and 2 weeks off.  We will get labs today as well and will send you message.  Continue the vitamins See me again in 3 months.  You are doing great! Keep it up

## 2016-04-03 NOTE — Assessment & Plan Note (Signed)
Patient once again did have multiple questions about diet, exercise, over-the-counter medications. We did address these fully today. We discussed which activities potentially avoid. Patient wanted other labs to rule out any other type of intervening factor that can be giving her the arthralgia. We discussed encouraged her to lose weight. We discussed proper shoes. Follow-up again in 3 months  Spent  25 minutes with patient face-to-face and had greater than 50% of counseling including as described above in assessment and plan.

## 2016-04-03 NOTE — Progress Notes (Signed)
Pre visit review using our clinic review tool, if applicable. No additional management support is needed unless otherwise documented below in the visit note. 

## 2016-04-03 NOTE — Assessment & Plan Note (Signed)
Encourage weight loss. 

## 2016-04-03 NOTE — Progress Notes (Signed)
[facility] [address] [place], [place] [address] Phone: [phone] Subjective:      CC: Arthralgia f/u  VOZ:[id number]:Subjective Nanetta BattyYvonne S Featherston is a 65 y.o. female coming in with complaint of multiple pain.patient has had a long-standing history of this.  Patient was treated for more of a chronic fatigue, arthralgia as well as or thyroid disease.  Patient is on medications for the thyroid now as well as for adrenal fatigue. Patient was making significant progress. Was noticing more energy. Less pain. Patient was to start increasing her activity. Patient states Still frustrated that she did not lose weight. Sometimes has good days and other days she has been days. Continues on the vitamins. States that when she has taken the DHEA she seems to be feeling better. Continues some of the thyroid medicine as well. Making progress. Patient states his long as she takes the medications she seems to be doing relatively well. Has noticed that she's had more energy and is doing more activities but still continued to be fatigued at the end of the day. Wondering if iron deficiency could be playing a role. Patient did have labs that rule out any autoimmune diseases a could be contributing.    Past Medical History  Diagnosis Date  . IBS (irritable bowel syndrome)   . Hypertension   . Thyroid disease    Past Surgical History  Procedure Laterality Date  . Abdominal hysterectomy    . Ovarian cyst removal    . Cesarean section     Social History  Substance Use Topics  . Smoking status: Never Smoker   . Smokeless tobacco: Never Used  . Alcohol Use: No   Allergies  Allergen Reactions  . Epinephrine     REACTION: heart pounds and races  . Penicillins     REACTION: swelling and rash  . Vancomycin     REACTION: head throbbing,heart raced, wave of heat throughout body   No family history on file.denies any family history of rheumatological diseases.  mily history all  reviewed in electronic medical record.   Review of Systems: No headache, visual changes, nausea, vomiting, diarrhea, constipation, dizziness, abdominal pain, skin rash, fevers, chills, night sweats, weight loss, swollen lymph nodes, chest pain, shortness of breath, mood changes.   Objective Blood pressure 134/78, pulse 84, height 5' 2.5" (1.588 m), weight 222 lb (100.699 kg), SpO2 97 %.  General: No apparent distress alert and oriented x3 mood and affect normal, dressed appropriately.  HEENT: Pupils equal, extraocular movements intact  Respiratory: Patient's speak in full sentences and does not appear short of breath  Cardiovascular: No lower extremity edema, non tender, no erythema  Skin: Warm dry intact with no signs of infection or rash on extremities or on axial skeleton.  Abdomen: Soft nontender  Neuro: Cranial nerves II through XII are intact, neurovascularly intact in all extremities with 2+ DTRs and 2+ pulses.  Lymph: No lymphadenopathy of posterior or anterior cervical chain or axillae bilaterally.  Gait normal with good balance and coordination.  MSK:  Non tender with full range of motion and good stability and symmetric strength and tone of shoulders, elbows, wrist, hip, knee and ankles bilaterally.  Continued tenderness of multiple joints.  Neck: Inspection unremarkable. No palpable stepoffs.patient is tender in the paraspinal musculature Negative Spurling's maneuver. Full neck range of motion  Grip strength and sensation normal in bilateral hands Strength good C4 to T1 distribution No sensory change to C4 to T1 Negative Hoffman  sign bilaterally Reflexes normal  Back Exam:  Inspection: Unremarkable patient does have central obesitystill present Motion: Flexion 45 deg, Extension 25 deg, Side Bending to 45 deg bilaterally,  Rotation to 45 deg bilaterally  SLR laying: Negative  XSLR laying: Negative  Really nontender over the back today and. FABER: Positive Faber on  right Sensory change: Gross sensation intact to all lumbar and sacral dermatomes.  Reflexes: 2+ at both patellar tendons, 2+ at achilles tendons, Babinski's downgoing.  Strength at foot  Plantar-flexion: 5/5 Dorsi-flexion: 5/5 Eversion: 5/5 Inversion: 5/5  Leg strength  Quad: 5/5 Hamstring: 5/5 Hip flexor: 5/5 Hip abductors: 4/5 but symmetric Gait unremarkable. Mild improvement noted    Impression and Recommendations:     This case required medical decision making of moderate complexity.

## 2016-04-04 LAB — THYROID PEROXIDASE ANTIBODY: Thyroperoxidase Ab SerPl-aCnc: <1 IU/mL (ref <9)

## 2016-04-19 DIAGNOSIS — M5124 Other intervertebral disc displacement, thoracic region: Secondary | ICD-10-CM | POA: Diagnosis not present

## 2016-04-19 DIAGNOSIS — M9907 Segmental and somatic dysfunction of upper extremity: Secondary | ICD-10-CM | POA: Diagnosis not present

## 2016-04-19 DIAGNOSIS — M9904 Segmental and somatic dysfunction of sacral region: Secondary | ICD-10-CM | POA: Diagnosis not present

## 2016-04-19 DIAGNOSIS — M47817 Spondylosis without myelopathy or radiculopathy, lumbosacral region: Secondary | ICD-10-CM | POA: Diagnosis not present

## 2016-04-19 DIAGNOSIS — M9903 Segmental and somatic dysfunction of lumbar region: Secondary | ICD-10-CM | POA: Diagnosis not present

## 2016-04-19 DIAGNOSIS — M9906 Segmental and somatic dysfunction of lower extremity: Secondary | ICD-10-CM | POA: Diagnosis not present

## 2016-04-19 DIAGNOSIS — S63529A Sprain of radiocarpal joint of unspecified wrist, initial encounter: Secondary | ICD-10-CM | POA: Diagnosis not present

## 2016-04-19 DIAGNOSIS — M9902 Segmental and somatic dysfunction of thoracic region: Secondary | ICD-10-CM | POA: Diagnosis not present

## 2016-04-19 DIAGNOSIS — M9901 Segmental and somatic dysfunction of cervical region: Secondary | ICD-10-CM | POA: Diagnosis not present

## 2016-04-19 DIAGNOSIS — S93629A Sprain of tarsometatarsal ligament of unspecified foot, initial encounter: Secondary | ICD-10-CM | POA: Diagnosis not present

## 2016-05-08 DIAGNOSIS — M5124 Other intervertebral disc displacement, thoracic region: Secondary | ICD-10-CM | POA: Diagnosis not present

## 2016-05-08 DIAGNOSIS — M47817 Spondylosis without myelopathy or radiculopathy, lumbosacral region: Secondary | ICD-10-CM | POA: Diagnosis not present

## 2016-05-08 DIAGNOSIS — M9901 Segmental and somatic dysfunction of cervical region: Secondary | ICD-10-CM | POA: Diagnosis not present

## 2016-05-08 DIAGNOSIS — M9903 Segmental and somatic dysfunction of lumbar region: Secondary | ICD-10-CM | POA: Diagnosis not present

## 2016-05-08 DIAGNOSIS — M9906 Segmental and somatic dysfunction of lower extremity: Secondary | ICD-10-CM | POA: Diagnosis not present

## 2016-05-08 DIAGNOSIS — S93629A Sprain of tarsometatarsal ligament of unspecified foot, initial encounter: Secondary | ICD-10-CM | POA: Diagnosis not present

## 2016-05-08 DIAGNOSIS — M9907 Segmental and somatic dysfunction of upper extremity: Secondary | ICD-10-CM | POA: Diagnosis not present

## 2016-05-08 DIAGNOSIS — M9902 Segmental and somatic dysfunction of thoracic region: Secondary | ICD-10-CM | POA: Diagnosis not present

## 2016-05-08 DIAGNOSIS — S63529A Sprain of radiocarpal joint of unspecified wrist, initial encounter: Secondary | ICD-10-CM | POA: Diagnosis not present

## 2016-05-08 DIAGNOSIS — M9904 Segmental and somatic dysfunction of sacral region: Secondary | ICD-10-CM | POA: Diagnosis not present

## 2016-05-31 DIAGNOSIS — S63529A Sprain of radiocarpal joint of unspecified wrist, initial encounter: Secondary | ICD-10-CM | POA: Diagnosis not present

## 2016-05-31 DIAGNOSIS — M9904 Segmental and somatic dysfunction of sacral region: Secondary | ICD-10-CM | POA: Diagnosis not present

## 2016-05-31 DIAGNOSIS — M9902 Segmental and somatic dysfunction of thoracic region: Secondary | ICD-10-CM | POA: Diagnosis not present

## 2016-05-31 DIAGNOSIS — M47817 Spondylosis without myelopathy or radiculopathy, lumbosacral region: Secondary | ICD-10-CM | POA: Diagnosis not present

## 2016-05-31 DIAGNOSIS — M9901 Segmental and somatic dysfunction of cervical region: Secondary | ICD-10-CM | POA: Diagnosis not present

## 2016-05-31 DIAGNOSIS — S93629A Sprain of tarsometatarsal ligament of unspecified foot, initial encounter: Secondary | ICD-10-CM | POA: Diagnosis not present

## 2016-05-31 DIAGNOSIS — M9907 Segmental and somatic dysfunction of upper extremity: Secondary | ICD-10-CM | POA: Diagnosis not present

## 2016-05-31 DIAGNOSIS — M9903 Segmental and somatic dysfunction of lumbar region: Secondary | ICD-10-CM | POA: Diagnosis not present

## 2016-05-31 DIAGNOSIS — M9906 Segmental and somatic dysfunction of lower extremity: Secondary | ICD-10-CM | POA: Diagnosis not present

## 2016-05-31 DIAGNOSIS — M5124 Other intervertebral disc displacement, thoracic region: Secondary | ICD-10-CM | POA: Diagnosis not present

## 2016-06-07 ENCOUNTER — Telehealth: Payer: Self-pay | Admitting: Pediatrics

## 2016-06-28 DIAGNOSIS — S93629A Sprain of tarsometatarsal ligament of unspecified foot, initial encounter: Secondary | ICD-10-CM | POA: Diagnosis not present

## 2016-06-28 DIAGNOSIS — M9901 Segmental and somatic dysfunction of cervical region: Secondary | ICD-10-CM | POA: Diagnosis not present

## 2016-06-28 DIAGNOSIS — M9907 Segmental and somatic dysfunction of upper extremity: Secondary | ICD-10-CM | POA: Diagnosis not present

## 2016-06-28 DIAGNOSIS — M9903 Segmental and somatic dysfunction of lumbar region: Secondary | ICD-10-CM | POA: Diagnosis not present

## 2016-06-28 DIAGNOSIS — M47817 Spondylosis without myelopathy or radiculopathy, lumbosacral region: Secondary | ICD-10-CM | POA: Diagnosis not present

## 2016-06-28 DIAGNOSIS — M9904 Segmental and somatic dysfunction of sacral region: Secondary | ICD-10-CM | POA: Diagnosis not present

## 2016-06-28 DIAGNOSIS — M9906 Segmental and somatic dysfunction of lower extremity: Secondary | ICD-10-CM | POA: Diagnosis not present

## 2016-06-28 DIAGNOSIS — M5124 Other intervertebral disc displacement, thoracic region: Secondary | ICD-10-CM | POA: Diagnosis not present

## 2016-06-28 DIAGNOSIS — M9902 Segmental and somatic dysfunction of thoracic region: Secondary | ICD-10-CM | POA: Diagnosis not present

## 2016-06-28 DIAGNOSIS — S63529A Sprain of radiocarpal joint of unspecified wrist, initial encounter: Secondary | ICD-10-CM | POA: Diagnosis not present

## 2016-07-02 ENCOUNTER — Other Ambulatory Visit: Payer: Self-pay | Admitting: Pediatrics

## 2016-07-02 DIAGNOSIS — E039 Hypothyroidism, unspecified: Secondary | ICD-10-CM

## 2016-07-03 ENCOUNTER — Encounter: Payer: Self-pay | Admitting: Family Medicine

## 2016-07-05 DIAGNOSIS — M9901 Segmental and somatic dysfunction of cervical region: Secondary | ICD-10-CM | POA: Diagnosis not present

## 2016-07-05 DIAGNOSIS — S63529A Sprain of radiocarpal joint of unspecified wrist, initial encounter: Secondary | ICD-10-CM | POA: Diagnosis not present

## 2016-07-05 DIAGNOSIS — M5124 Other intervertebral disc displacement, thoracic region: Secondary | ICD-10-CM | POA: Diagnosis not present

## 2016-07-05 DIAGNOSIS — M9904 Segmental and somatic dysfunction of sacral region: Secondary | ICD-10-CM | POA: Diagnosis not present

## 2016-07-05 DIAGNOSIS — S93629A Sprain of tarsometatarsal ligament of unspecified foot, initial encounter: Secondary | ICD-10-CM | POA: Diagnosis not present

## 2016-07-05 DIAGNOSIS — M9906 Segmental and somatic dysfunction of lower extremity: Secondary | ICD-10-CM | POA: Diagnosis not present

## 2016-07-05 DIAGNOSIS — M47817 Spondylosis without myelopathy or radiculopathy, lumbosacral region: Secondary | ICD-10-CM | POA: Diagnosis not present

## 2016-07-05 DIAGNOSIS — M9902 Segmental and somatic dysfunction of thoracic region: Secondary | ICD-10-CM | POA: Diagnosis not present

## 2016-07-05 DIAGNOSIS — M9903 Segmental and somatic dysfunction of lumbar region: Secondary | ICD-10-CM | POA: Diagnosis not present

## 2016-07-05 DIAGNOSIS — M9907 Segmental and somatic dysfunction of upper extremity: Secondary | ICD-10-CM | POA: Diagnosis not present

## 2016-07-09 DIAGNOSIS — M9902 Segmental and somatic dysfunction of thoracic region: Secondary | ICD-10-CM | POA: Diagnosis not present

## 2016-07-09 DIAGNOSIS — S63529A Sprain of radiocarpal joint of unspecified wrist, initial encounter: Secondary | ICD-10-CM | POA: Diagnosis not present

## 2016-07-09 DIAGNOSIS — M9901 Segmental and somatic dysfunction of cervical region: Secondary | ICD-10-CM | POA: Diagnosis not present

## 2016-07-09 DIAGNOSIS — M5124 Other intervertebral disc displacement, thoracic region: Secondary | ICD-10-CM | POA: Diagnosis not present

## 2016-07-09 DIAGNOSIS — M9904 Segmental and somatic dysfunction of sacral region: Secondary | ICD-10-CM | POA: Diagnosis not present

## 2016-07-09 DIAGNOSIS — M9906 Segmental and somatic dysfunction of lower extremity: Secondary | ICD-10-CM | POA: Diagnosis not present

## 2016-07-09 DIAGNOSIS — S93629A Sprain of tarsometatarsal ligament of unspecified foot, initial encounter: Secondary | ICD-10-CM | POA: Diagnosis not present

## 2016-07-09 DIAGNOSIS — M47817 Spondylosis without myelopathy or radiculopathy, lumbosacral region: Secondary | ICD-10-CM | POA: Diagnosis not present

## 2016-07-09 DIAGNOSIS — M9907 Segmental and somatic dysfunction of upper extremity: Secondary | ICD-10-CM | POA: Diagnosis not present

## 2016-07-09 DIAGNOSIS — M9903 Segmental and somatic dysfunction of lumbar region: Secondary | ICD-10-CM | POA: Diagnosis not present

## 2016-07-17 ENCOUNTER — Telehealth: Payer: Self-pay | Admitting: Pediatrics

## 2016-07-19 DIAGNOSIS — M9904 Segmental and somatic dysfunction of sacral region: Secondary | ICD-10-CM | POA: Diagnosis not present

## 2016-07-19 DIAGNOSIS — M5124 Other intervertebral disc displacement, thoracic region: Secondary | ICD-10-CM | POA: Diagnosis not present

## 2016-07-19 DIAGNOSIS — M47817 Spondylosis without myelopathy or radiculopathy, lumbosacral region: Secondary | ICD-10-CM | POA: Diagnosis not present

## 2016-07-19 DIAGNOSIS — S93629A Sprain of tarsometatarsal ligament of unspecified foot, initial encounter: Secondary | ICD-10-CM | POA: Diagnosis not present

## 2016-07-19 DIAGNOSIS — M9907 Segmental and somatic dysfunction of upper extremity: Secondary | ICD-10-CM | POA: Diagnosis not present

## 2016-07-19 DIAGNOSIS — M9903 Segmental and somatic dysfunction of lumbar region: Secondary | ICD-10-CM | POA: Diagnosis not present

## 2016-07-19 DIAGNOSIS — M9901 Segmental and somatic dysfunction of cervical region: Secondary | ICD-10-CM | POA: Diagnosis not present

## 2016-07-19 DIAGNOSIS — S63529A Sprain of radiocarpal joint of unspecified wrist, initial encounter: Secondary | ICD-10-CM | POA: Diagnosis not present

## 2016-07-19 DIAGNOSIS — M9902 Segmental and somatic dysfunction of thoracic region: Secondary | ICD-10-CM | POA: Diagnosis not present

## 2016-07-19 DIAGNOSIS — M9906 Segmental and somatic dysfunction of lower extremity: Secondary | ICD-10-CM | POA: Diagnosis not present

## 2016-08-02 DIAGNOSIS — M9907 Segmental and somatic dysfunction of upper extremity: Secondary | ICD-10-CM | POA: Diagnosis not present

## 2016-08-02 DIAGNOSIS — M9906 Segmental and somatic dysfunction of lower extremity: Secondary | ICD-10-CM | POA: Diagnosis not present

## 2016-08-02 DIAGNOSIS — M9904 Segmental and somatic dysfunction of sacral region: Secondary | ICD-10-CM | POA: Diagnosis not present

## 2016-08-02 DIAGNOSIS — M5124 Other intervertebral disc displacement, thoracic region: Secondary | ICD-10-CM | POA: Diagnosis not present

## 2016-08-02 DIAGNOSIS — M9903 Segmental and somatic dysfunction of lumbar region: Secondary | ICD-10-CM | POA: Diagnosis not present

## 2016-08-02 DIAGNOSIS — M47817 Spondylosis without myelopathy or radiculopathy, lumbosacral region: Secondary | ICD-10-CM | POA: Diagnosis not present

## 2016-08-02 DIAGNOSIS — M9902 Segmental and somatic dysfunction of thoracic region: Secondary | ICD-10-CM | POA: Diagnosis not present

## 2016-08-02 DIAGNOSIS — S93629A Sprain of tarsometatarsal ligament of unspecified foot, initial encounter: Secondary | ICD-10-CM | POA: Diagnosis not present

## 2016-08-02 DIAGNOSIS — M9901 Segmental and somatic dysfunction of cervical region: Secondary | ICD-10-CM | POA: Diagnosis not present

## 2016-08-02 DIAGNOSIS — S63529A Sprain of radiocarpal joint of unspecified wrist, initial encounter: Secondary | ICD-10-CM | POA: Diagnosis not present

## 2016-08-16 DIAGNOSIS — S93629A Sprain of tarsometatarsal ligament of unspecified foot, initial encounter: Secondary | ICD-10-CM | POA: Diagnosis not present

## 2016-08-16 DIAGNOSIS — M47817 Spondylosis without myelopathy or radiculopathy, lumbosacral region: Secondary | ICD-10-CM | POA: Diagnosis not present

## 2016-08-16 DIAGNOSIS — M9904 Segmental and somatic dysfunction of sacral region: Secondary | ICD-10-CM | POA: Diagnosis not present

## 2016-08-16 DIAGNOSIS — M9906 Segmental and somatic dysfunction of lower extremity: Secondary | ICD-10-CM | POA: Diagnosis not present

## 2016-08-16 DIAGNOSIS — M5124 Other intervertebral disc displacement, thoracic region: Secondary | ICD-10-CM | POA: Diagnosis not present

## 2016-08-16 DIAGNOSIS — M9902 Segmental and somatic dysfunction of thoracic region: Secondary | ICD-10-CM | POA: Diagnosis not present

## 2016-08-16 DIAGNOSIS — M9901 Segmental and somatic dysfunction of cervical region: Secondary | ICD-10-CM | POA: Diagnosis not present

## 2016-08-16 DIAGNOSIS — M9907 Segmental and somatic dysfunction of upper extremity: Secondary | ICD-10-CM | POA: Diagnosis not present

## 2016-08-16 DIAGNOSIS — S63529A Sprain of radiocarpal joint of unspecified wrist, initial encounter: Secondary | ICD-10-CM | POA: Diagnosis not present

## 2016-08-16 DIAGNOSIS — M9903 Segmental and somatic dysfunction of lumbar region: Secondary | ICD-10-CM | POA: Diagnosis not present

## 2016-08-30 DIAGNOSIS — M9902 Segmental and somatic dysfunction of thoracic region: Secondary | ICD-10-CM | POA: Diagnosis not present

## 2016-08-30 DIAGNOSIS — M9901 Segmental and somatic dysfunction of cervical region: Secondary | ICD-10-CM | POA: Diagnosis not present

## 2016-08-30 DIAGNOSIS — S93629A Sprain of tarsometatarsal ligament of unspecified foot, initial encounter: Secondary | ICD-10-CM | POA: Diagnosis not present

## 2016-08-30 DIAGNOSIS — M5124 Other intervertebral disc displacement, thoracic region: Secondary | ICD-10-CM | POA: Diagnosis not present

## 2016-08-30 DIAGNOSIS — M9903 Segmental and somatic dysfunction of lumbar region: Secondary | ICD-10-CM | POA: Diagnosis not present

## 2016-08-30 DIAGNOSIS — M47817 Spondylosis without myelopathy or radiculopathy, lumbosacral region: Secondary | ICD-10-CM | POA: Diagnosis not present

## 2016-08-30 DIAGNOSIS — S63529A Sprain of radiocarpal joint of unspecified wrist, initial encounter: Secondary | ICD-10-CM | POA: Diagnosis not present

## 2016-08-30 DIAGNOSIS — M9907 Segmental and somatic dysfunction of upper extremity: Secondary | ICD-10-CM | POA: Diagnosis not present

## 2016-08-30 DIAGNOSIS — M9906 Segmental and somatic dysfunction of lower extremity: Secondary | ICD-10-CM | POA: Diagnosis not present

## 2016-08-30 DIAGNOSIS — M9904 Segmental and somatic dysfunction of sacral region: Secondary | ICD-10-CM | POA: Diagnosis not present

## 2016-09-06 DIAGNOSIS — Z1389 Encounter for screening for other disorder: Secondary | ICD-10-CM | POA: Diagnosis not present

## 2016-09-06 DIAGNOSIS — Z01419 Encounter for gynecological examination (general) (routine) without abnormal findings: Secondary | ICD-10-CM | POA: Diagnosis not present

## 2016-09-06 DIAGNOSIS — Z6832 Body mass index (BMI) 32.0-32.9, adult: Secondary | ICD-10-CM | POA: Diagnosis not present

## 2016-09-20 DIAGNOSIS — M47817 Spondylosis without myelopathy or radiculopathy, lumbosacral region: Secondary | ICD-10-CM | POA: Diagnosis not present

## 2016-09-20 DIAGNOSIS — M5124 Other intervertebral disc displacement, thoracic region: Secondary | ICD-10-CM | POA: Diagnosis not present

## 2016-09-20 DIAGNOSIS — M9903 Segmental and somatic dysfunction of lumbar region: Secondary | ICD-10-CM | POA: Diagnosis not present

## 2016-09-20 DIAGNOSIS — M9906 Segmental and somatic dysfunction of lower extremity: Secondary | ICD-10-CM | POA: Diagnosis not present

## 2016-09-20 DIAGNOSIS — M9907 Segmental and somatic dysfunction of upper extremity: Secondary | ICD-10-CM | POA: Diagnosis not present

## 2016-09-20 DIAGNOSIS — M9901 Segmental and somatic dysfunction of cervical region: Secondary | ICD-10-CM | POA: Diagnosis not present

## 2016-09-20 DIAGNOSIS — M9904 Segmental and somatic dysfunction of sacral region: Secondary | ICD-10-CM | POA: Diagnosis not present

## 2016-09-20 DIAGNOSIS — S63529A Sprain of radiocarpal joint of unspecified wrist, initial encounter: Secondary | ICD-10-CM | POA: Diagnosis not present

## 2016-09-20 DIAGNOSIS — S93629A Sprain of tarsometatarsal ligament of unspecified foot, initial encounter: Secondary | ICD-10-CM | POA: Diagnosis not present

## 2016-09-20 DIAGNOSIS — M9902 Segmental and somatic dysfunction of thoracic region: Secondary | ICD-10-CM | POA: Diagnosis not present

## 2016-10-03 DIAGNOSIS — H1045 Other chronic allergic conjunctivitis: Secondary | ICD-10-CM | POA: Diagnosis not present

## 2016-10-03 DIAGNOSIS — H11423 Conjunctival edema, bilateral: Secondary | ICD-10-CM | POA: Diagnosis not present

## 2016-10-18 DIAGNOSIS — M9904 Segmental and somatic dysfunction of sacral region: Secondary | ICD-10-CM | POA: Diagnosis not present

## 2016-10-18 DIAGNOSIS — M9903 Segmental and somatic dysfunction of lumbar region: Secondary | ICD-10-CM | POA: Diagnosis not present

## 2016-10-18 DIAGNOSIS — M9902 Segmental and somatic dysfunction of thoracic region: Secondary | ICD-10-CM | POA: Diagnosis not present

## 2016-10-18 DIAGNOSIS — M9901 Segmental and somatic dysfunction of cervical region: Secondary | ICD-10-CM | POA: Diagnosis not present

## 2016-10-18 DIAGNOSIS — M47817 Spondylosis without myelopathy or radiculopathy, lumbosacral region: Secondary | ICD-10-CM | POA: Diagnosis not present

## 2016-10-18 DIAGNOSIS — M5124 Other intervertebral disc displacement, thoracic region: Secondary | ICD-10-CM | POA: Diagnosis not present

## 2016-10-18 DIAGNOSIS — M9907 Segmental and somatic dysfunction of upper extremity: Secondary | ICD-10-CM | POA: Diagnosis not present

## 2016-10-18 DIAGNOSIS — M9906 Segmental and somatic dysfunction of lower extremity: Secondary | ICD-10-CM | POA: Diagnosis not present

## 2016-10-18 DIAGNOSIS — S93629A Sprain of tarsometatarsal ligament of unspecified foot, initial encounter: Secondary | ICD-10-CM | POA: Diagnosis not present

## 2016-10-18 DIAGNOSIS — S63529A Sprain of radiocarpal joint of unspecified wrist, initial encounter: Secondary | ICD-10-CM | POA: Diagnosis not present

## 2016-11-08 DIAGNOSIS — M9904 Segmental and somatic dysfunction of sacral region: Secondary | ICD-10-CM | POA: Diagnosis not present

## 2016-11-08 DIAGNOSIS — S93629A Sprain of tarsometatarsal ligament of unspecified foot, initial encounter: Secondary | ICD-10-CM | POA: Diagnosis not present

## 2016-11-08 DIAGNOSIS — M47817 Spondylosis without myelopathy or radiculopathy, lumbosacral region: Secondary | ICD-10-CM | POA: Diagnosis not present

## 2016-11-08 DIAGNOSIS — M5124 Other intervertebral disc displacement, thoracic region: Secondary | ICD-10-CM | POA: Diagnosis not present

## 2016-11-08 DIAGNOSIS — M9906 Segmental and somatic dysfunction of lower extremity: Secondary | ICD-10-CM | POA: Diagnosis not present

## 2016-11-08 DIAGNOSIS — M9901 Segmental and somatic dysfunction of cervical region: Secondary | ICD-10-CM | POA: Diagnosis not present

## 2016-11-08 DIAGNOSIS — M9903 Segmental and somatic dysfunction of lumbar region: Secondary | ICD-10-CM | POA: Diagnosis not present

## 2016-11-08 DIAGNOSIS — M9902 Segmental and somatic dysfunction of thoracic region: Secondary | ICD-10-CM | POA: Diagnosis not present

## 2016-11-08 DIAGNOSIS — M9907 Segmental and somatic dysfunction of upper extremity: Secondary | ICD-10-CM | POA: Diagnosis not present

## 2016-11-08 DIAGNOSIS — S63529A Sprain of radiocarpal joint of unspecified wrist, initial encounter: Secondary | ICD-10-CM | POA: Diagnosis not present

## 2016-11-27 DIAGNOSIS — S63529A Sprain of radiocarpal joint of unspecified wrist, initial encounter: Secondary | ICD-10-CM | POA: Diagnosis not present

## 2016-11-27 DIAGNOSIS — M5124 Other intervertebral disc displacement, thoracic region: Secondary | ICD-10-CM | POA: Diagnosis not present

## 2016-11-27 DIAGNOSIS — M9907 Segmental and somatic dysfunction of upper extremity: Secondary | ICD-10-CM | POA: Diagnosis not present

## 2016-11-27 DIAGNOSIS — M9904 Segmental and somatic dysfunction of sacral region: Secondary | ICD-10-CM | POA: Diagnosis not present

## 2016-11-27 DIAGNOSIS — M9903 Segmental and somatic dysfunction of lumbar region: Secondary | ICD-10-CM | POA: Diagnosis not present

## 2016-11-27 DIAGNOSIS — M47817 Spondylosis without myelopathy or radiculopathy, lumbosacral region: Secondary | ICD-10-CM | POA: Diagnosis not present

## 2016-11-27 DIAGNOSIS — M9906 Segmental and somatic dysfunction of lower extremity: Secondary | ICD-10-CM | POA: Diagnosis not present

## 2016-11-27 DIAGNOSIS — S93629A Sprain of tarsometatarsal ligament of unspecified foot, initial encounter: Secondary | ICD-10-CM | POA: Diagnosis not present

## 2016-11-27 DIAGNOSIS — M9902 Segmental and somatic dysfunction of thoracic region: Secondary | ICD-10-CM | POA: Diagnosis not present

## 2016-11-27 DIAGNOSIS — M9901 Segmental and somatic dysfunction of cervical region: Secondary | ICD-10-CM | POA: Diagnosis not present

## 2016-11-29 DIAGNOSIS — S63529A Sprain of radiocarpal joint of unspecified wrist, initial encounter: Secondary | ICD-10-CM | POA: Diagnosis not present

## 2016-11-29 DIAGNOSIS — M47817 Spondylosis without myelopathy or radiculopathy, lumbosacral region: Secondary | ICD-10-CM | POA: Diagnosis not present

## 2016-11-29 DIAGNOSIS — M9902 Segmental and somatic dysfunction of thoracic region: Secondary | ICD-10-CM | POA: Diagnosis not present

## 2016-11-29 DIAGNOSIS — M9907 Segmental and somatic dysfunction of upper extremity: Secondary | ICD-10-CM | POA: Diagnosis not present

## 2016-11-29 DIAGNOSIS — M9904 Segmental and somatic dysfunction of sacral region: Secondary | ICD-10-CM | POA: Diagnosis not present

## 2016-11-29 DIAGNOSIS — M9906 Segmental and somatic dysfunction of lower extremity: Secondary | ICD-10-CM | POA: Diagnosis not present

## 2016-11-29 DIAGNOSIS — M9903 Segmental and somatic dysfunction of lumbar region: Secondary | ICD-10-CM | POA: Diagnosis not present

## 2016-11-29 DIAGNOSIS — M9901 Segmental and somatic dysfunction of cervical region: Secondary | ICD-10-CM | POA: Diagnosis not present

## 2016-11-29 DIAGNOSIS — M5124 Other intervertebral disc displacement, thoracic region: Secondary | ICD-10-CM | POA: Diagnosis not present

## 2016-11-29 DIAGNOSIS — S93629A Sprain of tarsometatarsal ligament of unspecified foot, initial encounter: Secondary | ICD-10-CM | POA: Diagnosis not present

## 2016-12-13 DIAGNOSIS — M9903 Segmental and somatic dysfunction of lumbar region: Secondary | ICD-10-CM | POA: Diagnosis not present

## 2016-12-13 DIAGNOSIS — M47817 Spondylosis without myelopathy or radiculopathy, lumbosacral region: Secondary | ICD-10-CM | POA: Diagnosis not present

## 2016-12-13 DIAGNOSIS — M5124 Other intervertebral disc displacement, thoracic region: Secondary | ICD-10-CM | POA: Diagnosis not present

## 2016-12-13 DIAGNOSIS — S63529A Sprain of radiocarpal joint of unspecified wrist, initial encounter: Secondary | ICD-10-CM | POA: Diagnosis not present

## 2016-12-13 DIAGNOSIS — M9902 Segmental and somatic dysfunction of thoracic region: Secondary | ICD-10-CM | POA: Diagnosis not present

## 2016-12-13 DIAGNOSIS — M9906 Segmental and somatic dysfunction of lower extremity: Secondary | ICD-10-CM | POA: Diagnosis not present

## 2016-12-13 DIAGNOSIS — M9904 Segmental and somatic dysfunction of sacral region: Secondary | ICD-10-CM | POA: Diagnosis not present

## 2016-12-13 DIAGNOSIS — S93629A Sprain of tarsometatarsal ligament of unspecified foot, initial encounter: Secondary | ICD-10-CM | POA: Diagnosis not present

## 2016-12-13 DIAGNOSIS — M9901 Segmental and somatic dysfunction of cervical region: Secondary | ICD-10-CM | POA: Diagnosis not present

## 2016-12-13 DIAGNOSIS — M9907 Segmental and somatic dysfunction of upper extremity: Secondary | ICD-10-CM | POA: Diagnosis not present

## 2017-01-03 DIAGNOSIS — M9904 Segmental and somatic dysfunction of sacral region: Secondary | ICD-10-CM | POA: Diagnosis not present

## 2017-01-03 DIAGNOSIS — M9903 Segmental and somatic dysfunction of lumbar region: Secondary | ICD-10-CM | POA: Diagnosis not present

## 2017-01-03 DIAGNOSIS — M9906 Segmental and somatic dysfunction of lower extremity: Secondary | ICD-10-CM | POA: Diagnosis not present

## 2017-01-03 DIAGNOSIS — S63529A Sprain of radiocarpal joint of unspecified wrist, initial encounter: Secondary | ICD-10-CM | POA: Diagnosis not present

## 2017-01-03 DIAGNOSIS — S93629A Sprain of tarsometatarsal ligament of unspecified foot, initial encounter: Secondary | ICD-10-CM | POA: Diagnosis not present

## 2017-01-03 DIAGNOSIS — M5124 Other intervertebral disc displacement, thoracic region: Secondary | ICD-10-CM | POA: Diagnosis not present

## 2017-01-03 DIAGNOSIS — M9907 Segmental and somatic dysfunction of upper extremity: Secondary | ICD-10-CM | POA: Diagnosis not present

## 2017-01-03 DIAGNOSIS — M9902 Segmental and somatic dysfunction of thoracic region: Secondary | ICD-10-CM | POA: Diagnosis not present

## 2017-01-03 DIAGNOSIS — M47817 Spondylosis without myelopathy or radiculopathy, lumbosacral region: Secondary | ICD-10-CM | POA: Diagnosis not present

## 2017-01-03 DIAGNOSIS — M9901 Segmental and somatic dysfunction of cervical region: Secondary | ICD-10-CM | POA: Diagnosis not present

## 2017-01-17 DIAGNOSIS — M9903 Segmental and somatic dysfunction of lumbar region: Secondary | ICD-10-CM | POA: Diagnosis not present

## 2017-01-17 DIAGNOSIS — M9902 Segmental and somatic dysfunction of thoracic region: Secondary | ICD-10-CM | POA: Diagnosis not present

## 2017-01-17 DIAGNOSIS — M9906 Segmental and somatic dysfunction of lower extremity: Secondary | ICD-10-CM | POA: Diagnosis not present

## 2017-01-17 DIAGNOSIS — S63529A Sprain of radiocarpal joint of unspecified wrist, initial encounter: Secondary | ICD-10-CM | POA: Diagnosis not present

## 2017-01-17 DIAGNOSIS — M5124 Other intervertebral disc displacement, thoracic region: Secondary | ICD-10-CM | POA: Diagnosis not present

## 2017-01-17 DIAGNOSIS — M47817 Spondylosis without myelopathy or radiculopathy, lumbosacral region: Secondary | ICD-10-CM | POA: Diagnosis not present

## 2017-01-17 DIAGNOSIS — M9901 Segmental and somatic dysfunction of cervical region: Secondary | ICD-10-CM | POA: Diagnosis not present

## 2017-01-17 DIAGNOSIS — M9907 Segmental and somatic dysfunction of upper extremity: Secondary | ICD-10-CM | POA: Diagnosis not present

## 2017-01-17 DIAGNOSIS — M9904 Segmental and somatic dysfunction of sacral region: Secondary | ICD-10-CM | POA: Diagnosis not present

## 2017-01-17 DIAGNOSIS — S93629A Sprain of tarsometatarsal ligament of unspecified foot, initial encounter: Secondary | ICD-10-CM | POA: Diagnosis not present

## 2017-02-22 DIAGNOSIS — M9903 Segmental and somatic dysfunction of lumbar region: Secondary | ICD-10-CM | POA: Diagnosis not present

## 2017-02-22 DIAGNOSIS — M9902 Segmental and somatic dysfunction of thoracic region: Secondary | ICD-10-CM | POA: Diagnosis not present

## 2017-02-22 DIAGNOSIS — S63529A Sprain of radiocarpal joint of unspecified wrist, initial encounter: Secondary | ICD-10-CM | POA: Diagnosis not present

## 2017-02-22 DIAGNOSIS — S93629A Sprain of tarsometatarsal ligament of unspecified foot, initial encounter: Secondary | ICD-10-CM | POA: Diagnosis not present

## 2017-02-22 DIAGNOSIS — M9907 Segmental and somatic dysfunction of upper extremity: Secondary | ICD-10-CM | POA: Diagnosis not present

## 2017-02-22 DIAGNOSIS — M5124 Other intervertebral disc displacement, thoracic region: Secondary | ICD-10-CM | POA: Diagnosis not present

## 2017-02-22 DIAGNOSIS — M47817 Spondylosis without myelopathy or radiculopathy, lumbosacral region: Secondary | ICD-10-CM | POA: Diagnosis not present

## 2017-02-22 DIAGNOSIS — M9901 Segmental and somatic dysfunction of cervical region: Secondary | ICD-10-CM | POA: Diagnosis not present

## 2017-02-22 DIAGNOSIS — M9904 Segmental and somatic dysfunction of sacral region: Secondary | ICD-10-CM | POA: Diagnosis not present

## 2017-02-22 DIAGNOSIS — M9906 Segmental and somatic dysfunction of lower extremity: Secondary | ICD-10-CM | POA: Diagnosis not present

## 2017-02-25 DIAGNOSIS — H43813 Vitreous degeneration, bilateral: Secondary | ICD-10-CM | POA: Diagnosis not present

## 2017-03-14 DIAGNOSIS — M9907 Segmental and somatic dysfunction of upper extremity: Secondary | ICD-10-CM | POA: Diagnosis not present

## 2017-03-14 DIAGNOSIS — M9901 Segmental and somatic dysfunction of cervical region: Secondary | ICD-10-CM | POA: Diagnosis not present

## 2017-03-14 DIAGNOSIS — S93629A Sprain of tarsometatarsal ligament of unspecified foot, initial encounter: Secondary | ICD-10-CM | POA: Diagnosis not present

## 2017-03-14 DIAGNOSIS — S63529A Sprain of radiocarpal joint of unspecified wrist, initial encounter: Secondary | ICD-10-CM | POA: Diagnosis not present

## 2017-03-14 DIAGNOSIS — M47817 Spondylosis without myelopathy or radiculopathy, lumbosacral region: Secondary | ICD-10-CM | POA: Diagnosis not present

## 2017-03-14 DIAGNOSIS — M9904 Segmental and somatic dysfunction of sacral region: Secondary | ICD-10-CM | POA: Diagnosis not present

## 2017-03-14 DIAGNOSIS — M9906 Segmental and somatic dysfunction of lower extremity: Secondary | ICD-10-CM | POA: Diagnosis not present

## 2017-03-14 DIAGNOSIS — M9903 Segmental and somatic dysfunction of lumbar region: Secondary | ICD-10-CM | POA: Diagnosis not present

## 2017-03-14 DIAGNOSIS — M9902 Segmental and somatic dysfunction of thoracic region: Secondary | ICD-10-CM | POA: Diagnosis not present

## 2017-03-14 DIAGNOSIS — M5124 Other intervertebral disc displacement, thoracic region: Secondary | ICD-10-CM | POA: Diagnosis not present

## 2017-04-02 DIAGNOSIS — M9903 Segmental and somatic dysfunction of lumbar region: Secondary | ICD-10-CM | POA: Diagnosis not present

## 2017-04-02 DIAGNOSIS — M9902 Segmental and somatic dysfunction of thoracic region: Secondary | ICD-10-CM | POA: Diagnosis not present

## 2017-04-02 DIAGNOSIS — M9907 Segmental and somatic dysfunction of upper extremity: Secondary | ICD-10-CM | POA: Diagnosis not present

## 2017-04-02 DIAGNOSIS — M9904 Segmental and somatic dysfunction of sacral region: Secondary | ICD-10-CM | POA: Diagnosis not present

## 2017-04-02 DIAGNOSIS — M47817 Spondylosis without myelopathy or radiculopathy, lumbosacral region: Secondary | ICD-10-CM | POA: Diagnosis not present

## 2017-04-02 DIAGNOSIS — S93629A Sprain of tarsometatarsal ligament of unspecified foot, initial encounter: Secondary | ICD-10-CM | POA: Diagnosis not present

## 2017-04-02 DIAGNOSIS — M9906 Segmental and somatic dysfunction of lower extremity: Secondary | ICD-10-CM | POA: Diagnosis not present

## 2017-04-02 DIAGNOSIS — M5124 Other intervertebral disc displacement, thoracic region: Secondary | ICD-10-CM | POA: Diagnosis not present

## 2017-04-02 DIAGNOSIS — M9901 Segmental and somatic dysfunction of cervical region: Secondary | ICD-10-CM | POA: Diagnosis not present

## 2017-04-02 DIAGNOSIS — S63529A Sprain of radiocarpal joint of unspecified wrist, initial encounter: Secondary | ICD-10-CM | POA: Diagnosis not present

## 2017-04-16 NOTE — Progress Notes (Signed)
[name] [address] [place], [place] [address] Phone: [phone] Subjective:      CC: Arthralgia f/u  [id number]  Allison Bell is a 66 y.o. female coming in with complaint of multiple joint pain.patient has had a long-standing history of this.  Patient was treated for more of a chronic fatigue, arthralgia as well as or thyroid disease.  Patient is on medications for the thyroid now as well as for adrenal fatigue. We have not seen patient for greater than 9 months.   Patient statesUnfortunately has had significant increasing anxiety recently. Patient feels like she is fatiguing again. Not taking any the vitamins that seemed to be helping previously. She states that the anxiety seems to be getting to her as well. Not doing the exercises regularly. Wondering what all she can possibly do an which medication she needs. Patient started trying to take more natural thyroid medications  Past Medical History:  Diagnosis Date  . Hypertension   . IBS (irritable bowel syndrome)   . Thyroid disease    Past Surgical History:  Procedure Laterality Date  . ABDOMINAL HYSTERECTOMY    . CESAREAN SECTION    . OVARIAN CYST REMOVAL     Social History  Substance Use Topics  . Smoking status: Never Smoker  . Smokeless tobacco: Never Used  . Alcohol use No   Allergies  Allergen Reactions  . Epinephrine     REACTION: heart pounds and races  . Penicillins     REACTION: swelling and rash  . Vancomycin     REACTION: head throbbing,heart raced, wave of heat throughout body   No family history on file.denies any family history of rheumatological diseases.  mily history all reviewed in electronic medical record.   Review of Systems: No  visual changes, nausea, vomiting, diarrhea, constipation, dizziness, abdominal pain, skin rash, fevers, chills, night sweats, weight loss, swollen lymph nodes, joint swelling,  chest pain, shortness of breath,  Positive  muscle aches, body aches, anxiety, headaches   Objective  Blood pressure 138/84, pulse 97, height 5' 2.5" (1.588 m), weight 220 lb (99.8 kg), SpO2 97 %.  Systems examined below as of 04/17/17 General: NAD A&O x3 mood, affect And patient does seem anxious HEENT: Pupils equal, extraocular movements intact no nystagmus Respiratory: not short of breath at rest or with speaking Cardiovascular: No lower extremity edema, non tender Skin: Warm dry intact with no signs of infection or rash on extremities or on axial skeleton. Abdomen: Soft nontender, no masses Neuro: Cranial nerves  intact, neurovascularly intact in all extremities with 2+ DTRs and 2+ pulses. Lymph: No lymphadenopathy appreciated today  Gait normal with good balance and coordination.  MSK:  Diffuse tender with full range of motion and good stability and symmetric strength and tone of shoulders, elbows, wrist,  knee hips and ankles bilaterally.   . Neck: Inspection mild increase in kyphosis of the upper back. No palpable stepoffs. Negative Spurling's maneuver. Lacks last 5 to side bending bilaterally and extension Grip strength and sensation normal in bilateral hands Strength good C4 to T1 distribution No sensory change to C4 to T1 Negative Hoffman sign bilaterally Reflexes normal  Back Exam:  Inspection: Unremarkable  Motion: Flexion 45 deg, Extension 25 deg, Side Bending to 45 deg bilaterally,  Rotation to 45 deg bilaterally  SLR laying: Negative  XSLR laying: Negative  Palpable tenderness: None. FABER: Tightness bilaterally. Sensory change: Gross sensation intact to all lumbar and sacral dermatomes.  Reflexes:  2+ at both patellar tendons, 2+ at achilles tendons, Babinski's downgoing.  Strength at foot  Plantar-flexion: 5/5 Dorsi-flexion: 5/5 Eversion: 5/5 Inversion: 5/5  Leg strength  Quad: 5/5 Hamstring: 5/5 Hip flexor: 5/5 Hip abductors: 5/5  Gait unremarkable.     Impression and Recommendations:     This  case required medical decision making of moderate complexity.

## 2017-04-17 ENCOUNTER — Ambulatory Visit (INDEPENDENT_AMBULATORY_CARE_PROVIDER_SITE_OTHER): Payer: PPO | Admitting: Family Medicine

## 2017-04-17 ENCOUNTER — Encounter: Payer: Self-pay | Admitting: Family Medicine

## 2017-04-17 ENCOUNTER — Other Ambulatory Visit (INDEPENDENT_AMBULATORY_CARE_PROVIDER_SITE_OTHER): Payer: PPO

## 2017-04-17 VITALS — BP 138/84 | HR 97 | Ht 62.5 in | Wt 220.0 lb

## 2017-04-17 DIAGNOSIS — M2550 Pain in unspecified joint: Secondary | ICD-10-CM | POA: Diagnosis not present

## 2017-04-17 LAB — IBC PANEL
Iron: 93 ug/dL (ref 42–145)
Saturation Ratios: 27.3 % (ref 20.0–50.0)
Transferrin: 243 mg/dL (ref 212.0–360.0)

## 2017-04-17 LAB — CORTISOL: CORTISOL PLASMA: 12.7 ug/dL

## 2017-04-17 LAB — TSH: TSH: 2.16 u[IU]/mL (ref 0.35–4.50)

## 2017-04-17 LAB — T3, FREE: T3 FREE: 3.1 pg/mL (ref 2.3–4.2)

## 2017-04-17 LAB — T4, FREE: Free T4: 0.81 ng/dL (ref 0.60–1.60)

## 2017-04-17 LAB — SEDIMENTATION RATE: SED RATE: 10 mm/h (ref 0–30)

## 2017-04-17 LAB — VITAMIN D 25 HYDROXY (VIT D DEFICIENCY, FRACTURES): VITD: 66.19 ng/mL (ref 30.00–100.00)

## 2017-04-17 LAB — CALCIUM, IONIZED: CALCIUM ION: 4.9 mg/dL (ref 4.8–5.6)

## 2017-04-17 MED ORDER — GABAPENTIN 100 MG PO CAPS: 200.0000 mg | ORAL_CAPSULE | Freq: Every day | ORAL | 3 refills | Status: DC

## 2017-04-17 MED ORDER — HYDROXYZINE HCL 10 MG PO TABS: 10.0000 mg | ORAL_TABLET | Freq: Three times a day (TID) | ORAL | 0 refills | Status: DC | PRN

## 2017-04-17 NOTE — Assessment & Plan Note (Signed)
Continues to have syndrome symptoms and seems to be secondary to more of patient's anxiety causing her to have some somatic dysfunction. Discussed with patient at great length. We discussed home exercises and icing protocol. Patient is going to try the over-the-counter medications and has been suggested previously. Laboratory workup to rule out other organic causes such as thyroid that could be beneficial. Patient is conduction a make these changes and come back and see me again in 4-6 weeks.

## 2017-04-17 NOTE — Patient Instructions (Addendum)
Good to see you  Gustavus Bryantce is your friend.  Gabapentin 200mg  at night Hydroxyzine up to 3 times a day for anxiety.  DHEA 50 mg daily for 4 weeks.  We will get labs and make sure nothing else is wrong See me again in 3-4 weeks Write me in 1-2 weeks so I know you are doing better

## 2017-04-18 LAB — CYCLIC CITRUL PEPTIDE ANTIBODY, IGG

## 2017-04-22 LAB — DHEA: DHEA: 265 ng/dL (ref 102–1185)

## 2017-04-23 DIAGNOSIS — M9907 Segmental and somatic dysfunction of upper extremity: Secondary | ICD-10-CM | POA: Diagnosis not present

## 2017-04-23 DIAGNOSIS — M9904 Segmental and somatic dysfunction of sacral region: Secondary | ICD-10-CM | POA: Diagnosis not present

## 2017-04-23 DIAGNOSIS — M9906 Segmental and somatic dysfunction of lower extremity: Secondary | ICD-10-CM | POA: Diagnosis not present

## 2017-04-23 DIAGNOSIS — S63529A Sprain of radiocarpal joint of unspecified wrist, initial encounter: Secondary | ICD-10-CM | POA: Diagnosis not present

## 2017-04-23 DIAGNOSIS — M9901 Segmental and somatic dysfunction of cervical region: Secondary | ICD-10-CM | POA: Diagnosis not present

## 2017-04-23 DIAGNOSIS — M9902 Segmental and somatic dysfunction of thoracic region: Secondary | ICD-10-CM | POA: Diagnosis not present

## 2017-04-23 DIAGNOSIS — S93629A Sprain of tarsometatarsal ligament of unspecified foot, initial encounter: Secondary | ICD-10-CM | POA: Diagnosis not present

## 2017-04-23 DIAGNOSIS — M9903 Segmental and somatic dysfunction of lumbar region: Secondary | ICD-10-CM | POA: Diagnosis not present

## 2017-04-23 DIAGNOSIS — M5124 Other intervertebral disc displacement, thoracic region: Secondary | ICD-10-CM | POA: Diagnosis not present

## 2017-04-23 DIAGNOSIS — M47817 Spondylosis without myelopathy or radiculopathy, lumbosacral region: Secondary | ICD-10-CM | POA: Diagnosis not present

## 2017-05-15 ENCOUNTER — Ambulatory Visit (INDEPENDENT_AMBULATORY_CARE_PROVIDER_SITE_OTHER): Payer: PPO | Admitting: Family Medicine

## 2017-05-15 ENCOUNTER — Encounter: Payer: Self-pay | Admitting: Family Medicine

## 2017-05-15 VITALS — BP 160/100 | HR 88 | Ht 62.5 in | Wt 220.0 lb

## 2017-05-15 DIAGNOSIS — G5701 Lesion of sciatic nerve, right lower limb: Secondary | ICD-10-CM | POA: Diagnosis not present

## 2017-05-15 DIAGNOSIS — M545 Low back pain, unspecified: Secondary | ICD-10-CM

## 2017-05-15 NOTE — Patient Instructions (Signed)
Good to see you  We will get you in to PT  They will be calling you  I will be thinking of you going through this.  Continue the other medications Vega Sport 1 scoop 1-2 times a day  See me again in 6-8 weeks

## 2017-05-15 NOTE — Progress Notes (Signed)
d 

## 2017-05-15 NOTE — Progress Notes (Signed)
[name] [address] [place], [place] [address] Phone: [phone] Subjective:    CC: Arthralgia f/u  [id number]  Allison Bell is a 66 y.o. female coming in with complaint of multiple joint pain.patient has had a long-standing history of this.  Patient was treated for more of a chronic fatigue, arthralgia as well as or thyroid disease.  Patient at last exam was having worsening back pain. We discussed with patient at great length. Patient was to start gabapentin and hydroxyzine which she did not. Patient recently has had some difficulty with husband being in the hospital with recent colon cancer. Patient has not been taking care of herself. Has noticed that she is not eating as well and think that this is contributing to some of the pain as well. Patient has not been doing the exercises. Patient feels like she needs motivation from somewhere else.  Past Medical History:  Diagnosis Date  . Hypertension   . IBS (irritable bowel syndrome)   . Thyroid disease    Past Surgical History:  Procedure Laterality Date  . ABDOMINAL HYSTERECTOMY    . CESAREAN SECTION    . OVARIAN CYST REMOVAL     Social History  Substance Use Topics  . Smoking status: Never Smoker  . Smokeless tobacco: Never Used  . Alcohol use No   Allergies  Allergen Reactions  . Epinephrine     REACTION: heart pounds and races  . Penicillins     REACTION: swelling and rash  . Vancomycin     REACTION: head throbbing,heart raced, wave of heat throughout body   No family history on file.denies any family history of rheumatological diseases.  mily history all reviewed in electronic medical record.   Review of Systems: No visual changes, nausea, vomiting, diarrhea, constipation, dizziness, abdominal pain, skin rash, fevers, chills, night sweats, weight loss, swollen lymph nodes,chest pain, shortness of breath, mood changes.  Positive muscle aches, body aches, headaches    Objective  There were no vitals taken for this visit.  Systems examined below as of 05/15/17 General: NAD A&O x3 mood, affect normal obese HEENT: Pupils equal, extraocular movements intact no nystagmus Respiratory: not short of breath at rest or with speaking Cardiovascular: No lower extremity edema, non tender Skin: Warm dry intact with no signs of infection or rash on extremities or on axial skeleton. Abdomen: Soft nontender, no masses Neuro: Cranial nerves  intact, neurovascularly intact in all extremities with 2+ DTRs and 2+ pulses. Lymph: No lymphadenopathy appreciated today  Gait normal with good balance and coordination.  MSK:  Diffuse tender with full range of motion and good stability and symmetric strength and tone of shoulders, elbows, wrist,  knee hips and ankles bilaterally.  Possibly worsen previous exam . Neck: Inspection unremarkable. No palpable stepoffs. Negative Spurling's maneuver. Limitation in some range of motion with patient in all planes.ion normal in bilateral hands Strength good C4 to T1 distribution No sensory change to C4 to T1 Negative Hoffman sign bilaterally Reflexes normal  Back Exam:  Inspection: loss of lordosis  Motion: Flexion 40 deg, Extension 25 deg, Side Bending to 40 deg bilaterally,  Rotation to 40 deg bilaterally  SLR laying: Negative  XSLR laying: Negative  Palpable tenderTender to palpation and appears palmar musculature. Marland Kitchen FABER: Tightness bilaterally. even increased from previous exam  Sensory change: Gross sensation intact to all lumbar and sacral dermatomes.  Reflexes: 2+ at both patellar tendons, 2+ at achilles tendons, Babinski's downgoing.  Strength  at foot 4+/-5 but symmetric     Impression and Recommendations:     This case required medical decision making of moderate complexity.

## 2017-05-15 NOTE — Assessment & Plan Note (Signed)
Worsening symptoms. Especially patient's piriformis syndrome. I do believe that there is some other possibilities that could be contribute in. Patient has been noncompliant with some of the medications as well as the home exercises. Patient will be sent to physical therapy. I think that hopefully she is motivated enough to make a change in a controlled setting. Once again and not going to make any indication changes at this time. Patient will follow-up with me again in 6-8 weeks.

## 2017-05-27 ENCOUNTER — Ambulatory Visit (INDEPENDENT_AMBULATORY_CARE_PROVIDER_SITE_OTHER): Payer: PPO | Admitting: Physical Therapy

## 2017-05-27 DIAGNOSIS — G8929 Other chronic pain: Secondary | ICD-10-CM

## 2017-05-27 DIAGNOSIS — M542 Cervicalgia: Secondary | ICD-10-CM | POA: Diagnosis not present

## 2017-05-27 DIAGNOSIS — M6281 Muscle weakness (generalized): Secondary | ICD-10-CM | POA: Diagnosis not present

## 2017-05-27 DIAGNOSIS — R293 Abnormal posture: Secondary | ICD-10-CM

## 2017-05-27 DIAGNOSIS — M545 Low back pain: Secondary | ICD-10-CM

## 2017-05-27 NOTE — Patient Instructions (Signed)
Extensors / Rotators, Supine    Lie supine, one leg straight, other leg bent, knee held by opposite hand. Gently pull knee toward opposite shoulder. Feel stretch in buttocks and outside of hip. Hold _30__ seconds.  Repeat with other leg. Repeat _2-3__ times per session. Do _2-3__ sessions per day.  Trigger Point Dry Needling  . What is Trigger Point Dry Needling (DN)? o DN is a physical therapy technique used to treat muscle pain and dysfunction. Specifically, DN helps deactivate muscle trigger points (muscle knots).  o A thin filiform needle is used to penetrate the skin and stimulate the underlying trigger point. The goal is for a local twitch response (LTR) to occur and for the trigger point to relax. No medication of any kind is injected during the procedure.   . What Does Trigger Point Dry Needling Feel Like?  o The procedure feels different for each individual patient. Some patients report that they do not actually feel the needle enter the skin and overall the process is not painful. Very mild bleeding may occur. However, many patients feel a deep cramping in the muscle in which the needle was inserted. This is the local twitch response.   Marland Kitchen. How Will I feel after the treatment? o Soreness is normal, and the onset of soreness may not occur for a few hours. Typically this soreness does not last longer than two days.  o Bruising is uncommon, however; ice can be used to decrease any possible bruising.  o In rare cases feeling tired or nauseous after the treatment is normal. In addition, your symptoms may get worse before they get better, this period will typically not last longer than 24 hours.   . What Can I do After My Treatment? o Increase your hydration by drinking more water for the next 24 hours. o You may place ice or heat on the areas treated that have become sore, however, do not use heat on inflamed or bruised areas. Heat often brings more relief post needling. o You can continue  your regular activities, but vigorous activity is not recommended initially after the treatment for 24 hours. o DN is best combined with other physical therapy such as strengthening, stretching, and other therapies.

## 2017-05-27 NOTE — Therapy (Signed)
[facility] [address] [place], [place], [address] Phone: [phone]   Fax:  [phone]  Physical Therapy Evaluation  Patient Details  Name: Allison Bell MRN: [id number] Date of Birth: [birth year] Referring Provider: Dr. Terrilee Files  Encounter Date: 05/27/2017      PT End of Session - 05/27/17 1509    Visit Number 1   Number of Visits 12   Date for PT Re-Evaluation 07/08/17   Authorization Type [employer]   PT Start Time 1345   PT Stop Time 1427   PT Time Calculation (min) 42 min   Activity Tolerance Patient tolerated treatment well   Behavior During Therapy [facility] for tasks assessed/performed      Past Medical History:  Diagnosis Date  . Hypertension   . IBS (irritable bowel syndrome)   . Thyroid disease     Past Surgical History:  Procedure Laterality Date  . ABDOMINAL HYSTERECTOMY    . CESAREAN SECTION    . OVARIAN CYST REMOVAL      There were no vitals filed for this visit.       Subjective Assessment - 05/27/17 1348    Subjective Pt is a 66 y/o female who presents to OPPT with chronic back pain x 30-40 years.  Pt c/o stiffness and pain especially upon waking in the morning. Reports pain can be in various locations but mostly isolated to neck and back. Pt also expresses current stress at home due to critically ill husband.   Pertinent History obesity, HTN   Limitations House hold activities;Sitting;Standing   How long can you sit comfortably? 30-40 min   How long can you stand comfortably? days vary: 30-45 min   Patient Stated Goals learn how to maintain flexibility, increase endurance and decrease pain   Currently in Pain? Yes   Pain Score 4   up to 7/10   Pain Location Back  and neck   Pain Orientation Left;Right   Pain Descriptors / Indicators Constant;Radiating;Tightness;Stabbing;Aching   Pain Radiating Towards neck: radiates between shoulder blades and around chest; low back: into hips   Pain Onset More than a month ago   Pain Frequency Constant   Aggravating Factors  sitting, standing, stress, ADLs (bending forward)   Pain Relieving Factors repositioning, ice            [facility] PT Assessment - 05/27/17 1358      Assessment   Medical Diagnosis low back pain   Referring Provider Dr. Terrilee Files   Onset Date/Surgical Date --  decades ago   Next MD Visit 07/03/17   Prior Therapy many years ago - Rt shoulder     Precautions   Precautions None     Balance Screen   Has the patient fallen in the past 6 months No   Has the patient had a decrease in activity level because of a fear of falling?  No   Is the patient reluctant to leave their home because of a fear of falling?  No     Home Environment   Living Environment Private residence   Living Arrangements Spouse/significant other   Type of Home House   Home Access Stairs to enter   Entrance Stairs-Number of Steps 2   Entrance Stairs-Rails None   Home Layout Able to live on main level with bedroom/bathroom  basement   Additional Comments currently having to provide assistance with ADLs for husband; reports some difficulty with going up/down stairs     Prior  Function   Level of Independence Independent   Vocation Retired   [employer] retired Runner, broadcasting/film/video   Leisure walking (unable due to husband)     Cognition   Overall Cognitive Status Within Functional Limits for tasks assessed     Posture/Postural Control   Posture/Postural Control Postural limitations   Postural Limitations Decreased lumbar lordosis;Forward head;Rounded Shoulders     ROM / Strength   AROM / PROM / Strength AROM;Strength     AROM   Overall AROM Comments lumbar extension limited ~ 50%; bil sidebending limited ~ 25%; cervical ROM limited ~ 25% all directions with tightness and pain     Strength   Strength Assessment Site Hip;Knee   Right/Left Hip Right;Left   Right Hip Flexion 4/5   Right Hip Extension 3+/5   Right Hip  ABduction 3+/5   Left Hip Flexion 3+/5   Left Hip Extension 3+/5   Left Hip ABduction 3+/5   Right/Left Knee Left;Right   Right Knee Flexion 4/5   Right Knee Extension 5/5   Left Knee Flexion 4/5   Left Knee Extension 5/5     Flexibility   Soft Tissue Assessment /Muscle Length yes   Hamstrings tightness bil R>L   Piriformis tightness bil R>L     Palpation   Palpation comment tightness noted in bil glut med/min worse on Rt     Special Tests    Special Tests Lumbar   Lumbar Tests Straight Leg Raise     Straight Leg Raise   Findings Negative            Objective measurements completed on examination: See above findings.          [facility] Adult PT Treatment/Exercise - 05/27/17 1358      Self-Care   Self-Care Other Self-Care Comments   Other Self-Care Comments  other piriformis stretch (has figure 4 from MD office); TDN, and use of ball for myofascial release                PT Education - 05/27/17 1509    Education provided Yes   Education Details see self care   Person(s) Educated Patient   Methods Explanation;Demonstration;Handout   Comprehension Verbalized understanding;Returned demonstration;Need further instruction             PT Long Term Goals - 05/27/17 1520      PT LONG TERM GOAL #1   Title verbalize understanding of posture/body mechanics to decrease risk of reinjury (07/08/17)   Time 6   Period Weeks   Status New     PT LONG TERM GOAL #2   Title independent with HEP (07/08/17)   Time 6   Period Weeks   Status New     PT LONG TERM GOAL #3   Title perform lumbar ROM without increase in pain for improved function (07/08/17)   Time 6   Period Weeks   Status New     PT LONG TERM GOAL #4   Title report ability to stand/walk > 45 min for improved activity tolerance (07/08/17)   Time 6   Period Weeks   Status New                Plan - 05/27/17 1513    Clinical Impression Statement Pt is a 66 y/o female who presents to  OPPT for LBP as well as neck pain.  Pt demonstrates decreased ROM, active trigger points, decreased flexibility and strength affecting functional mobility.  Will benefit from PT  to address these deficits.   History and Personal Factors relevant to plan of care: stressful home life (husband critically ill), obesity   Clinical Presentation Stable   Clinical Decision Making Low   Rehab Potential Good   PT Frequency 2x / week   PT Duration 6 weeks   PT Treatment/Interventions ADLs/Self Care Home Management;Cryotherapy;Electrical Stimulation;Moist Heat;Ultrasound;Neuromuscular re-education;Therapeutic exercise;Therapeutic activities;Functional mobility training;Stair training;Gait training;Patient/family education;Manual techniques;Taping;Dry needling   PT Next Visit Plan TDN if pt agreeable to gluts/piriformis, review stretches, core/hip stability and modalities   Consulted and Agree with Plan of Care Patient      Patient will benefit from skilled therapeutic intervention in order to improve the following deficits and impairments:  Decreased activity tolerance, Decreased mobility, Difficulty walking, Decreased strength, Decreased range of motion, Impaired flexibility, Increased fascial restricitons, Pain, Postural dysfunction  Visit Diagnosis: Chronic midline low back pain without sciatica - Plan: PT plan of care cert/re-cert  Abnormal posture - Plan: PT plan of care cert/re-cert  Cervicalgia - Plan: PT plan of care cert/re-cert  Muscle weakness (generalized) - Plan: PT plan of care cert/re-cert      [facility] - 05/27/17 1530    Functional Assessment Tool Used  clinical judgement   Functional Limitations Changing and maintaining body position   Changing and Maintaining Body Position Current Status ([id number]) At least 40 percent but less than 60 percent impaired, limited or restricted   Changing and Maintaining Body Position Goal Status (U0454(G8982) At least 1 percent but less than 20  percent impaired, limited or restricted       Problem List Patient Active Problem List   Diagnosis Date Noted  . Hyperlipidemia 01/21/2016  . Screening for colon cancer 12/28/2015  . Piriformis syndrome of right side 10/26/2015  . Arthralgia 09/29/2015  . Thyroid disease   . Metabolic syndrome 06/01/2013  . Abnormal EKG 03/07/2011  . Heart murmur 03/07/2011  . IBS (irritable bowel syndrome)   . Hypertension   . Hypothyroidism 06/12/2010  . MORBID OBESITY 06/12/2010      Clarita CraneStephanie F Kenzly Rogoff, PT, DPT 05/27/17 3:34 PM    [facility] [address] [address] [place], [place], [address] Phone: [phone]   Fax:  [phone]  Name: Allison Bell MRN: [id number] Date of Birth: 04/08/1951

## 2017-05-31 ENCOUNTER — Other Ambulatory Visit: Payer: Self-pay | Admitting: *Deleted

## 2017-05-31 MED ORDER — LORAZEPAM 0.5 MG PO TABS: ORAL_TABLET | ORAL | 0 refills | Status: DC

## 2017-05-31 NOTE — Progress Notes (Signed)
Patients husband passed away last night and per Dr. Christell ConstantMoore please call in Lorazepam 0.5mg  1/2 tab during the day and 1/2-1 qhs #6 0rf. Rx called into pharmacy.

## 2017-06-06 ENCOUNTER — Ambulatory Visit (INDEPENDENT_AMBULATORY_CARE_PROVIDER_SITE_OTHER): Payer: PPO | Admitting: Physical Therapy

## 2017-06-06 DIAGNOSIS — G8929 Other chronic pain: Secondary | ICD-10-CM | POA: Diagnosis not present

## 2017-06-06 DIAGNOSIS — M542 Cervicalgia: Secondary | ICD-10-CM

## 2017-06-06 DIAGNOSIS — M6281 Muscle weakness (generalized): Secondary | ICD-10-CM

## 2017-06-06 DIAGNOSIS — R293 Abnormal posture: Secondary | ICD-10-CM

## 2017-06-06 DIAGNOSIS — M545 Low back pain: Secondary | ICD-10-CM | POA: Diagnosis not present

## 2017-06-06 NOTE — Therapy (Signed)
[facility] [address] [place], [place], [address] Phone: [phone]   Fax:  [phone]  Physical Therapy Treatment  Patient Details  Name: Allison Bell MRN: [id number] Date of Birth: 11/09/1951 Referring Provider: Dr. Terrilee Files  Encounter Date: 06/06/2017      PT End of Session - 06/06/17 1427    Visit Number 2   Number of Visits 12   Date for PT Re-Evaluation 07/08/17   Authorization Type [employer]   PT Start Time 1350   PT Stop Time 1435   PT Time Calculation (min) 45 min   Activity Tolerance Patient tolerated treatment well   Behavior During Therapy [facility] for tasks assessed/performed      Past Medical History:  Diagnosis Date  . Hypertension   . IBS (irritable bowel syndrome)   . Thyroid disease     Past Surgical History:  Procedure Laterality Date  . ABDOMINAL HYSTERECTOMY    . CESAREAN SECTION    . OVARIAN CYST REMOVAL      There were no vitals filed for this visit.      Subjective Assessment - 06/06/17 1351    Subjective husband passed away on Apr 28, 2023, but she wants to try and come to help her pain.   Pertinent History obesity, HTN   Patient Stated Goals learn how to maintain flexibility, increase endurance and decrease pain   Currently in Pain? Yes   Pain Score 4    Pain Location Back   Pain Orientation Left;Right   Pain Descriptors / Indicators Aching;Sharp;Constant;Shooting;Tightness   Pain Type Chronic pain   Pain Onset More than a month ago   Pain Frequency Constant   Aggravating Factors  sitting, standing, stress, ADLs (bending forward)   Pain Relieving Factors repositioning, ice   Multiple Pain Sites Yes   Pain Score 6   Pain Location Neck   Pain Orientation Left;Right;Posterior   Pain Descriptors / Indicators Constant;Stabbing;Tingling   Pain Type Chronic pain   Pain Radiating Towards tingling into hand   Pain Onset More than a month ago   Pain Frequency Constant   Aggravating Factors  stress, fatigue, pushing w/c   Pain Relieving Factors ice pack, stretch            [facility] PT Assessment - 06/06/17 0001      AROM   AROM Assessment Site Cervical   Cervical Flexion 32   Cervical Extension 39   Cervical - Right Side Bend 27   Cervical - Left Side Bend 20     Strength   Strength Assessment Site Shoulder   Right/Left Shoulder Right;Left   Right Shoulder Flexion 4/5   Right Shoulder ABduction 3/5   Right Shoulder Internal Rotation 5/5   Right Shoulder External Rotation 3+/5   Left Shoulder Flexion 4/5   Left Shoulder ABduction 3/5   Left Shoulder Internal Rotation 5/5   Left Shoulder External Rotation 4/5     Palpation   Palpation comment trigger points noted in bil upper trap, rhomboids and levator scapula     Special Tests    Special Tests Cervical   Cervical Tests Spurling's;Dictraction     Spurling's   Findings Negative     Distraction Test   Findngs Negative                     [facility] Adult PT Treatment/Exercise - 06/06/17 1421      Exercises   Exercises Neck     Neck  Exercises: Standing   Other Standing Exercises scapular retraction x 10 reps     Neck Exercises: Seated   Shoulder Rolls 10 reps;Backwards     Modalities   Modalities Cryotherapy;Electrical Stimulation     Cryotherapy   Number Minutes Cryotherapy 15 Minutes   Cryotherapy Location Cervical   Type of Cryotherapy Ice pack     Electrical Stimulation   Electrical Stimulation Location neck and upper trap   Electrical Stimulation Action IFC   Electrical Stimulation Parameters to tolerance x 15 min   Electrical Stimulation Goals Pain     Neck Exercises: Stretches   Upper Trapezius Stretch 1 rep;30 seconds   Upper Trapezius Stretch Limitations bil   Levator Stretch 1 rep;30 seconds   Levator Stretch Limitations bil   Other Neck Stretches low doorway stretch 2 x 30 sec                PT Education - 06/06/17 1427    Education  provided Yes   Education Details HEP   Person(s) Educated Patient   Methods Explanation;Demonstration;Handout   Comprehension Verbalized understanding;Returned demonstration;Need further instruction             PT Long Term Goals - 06/06/17 1427      PT LONG TERM GOAL #1   Title verbalize understanding of posture/body mechanics to decrease risk of reinjury (07/08/17)   Status On-going     PT LONG TERM GOAL #2   Title independent with HEP (07/08/17)   Status On-going     PT LONG TERM GOAL #3   Title perform lumbar ROM without increase in pain for improved function (07/08/17)   Status On-going     PT LONG TERM GOAL #4   Title report ability to stand/walk > 45 min for improved activity tolerance (07/08/17)   Status On-going     PT LONG TERM GOAL #5   Title demonstrate 4/5 strength in UEs for improved function (07/08/17)   Status New     PT LONG TERM GOAL #6   Title report pain < 4/10 in neck for improved mobility and function (07/08/17)   Status New               Plan - 06/06/17 1428    Clinical Impression Statement Session today focused on assessment of neck pain and HEP to address pain.  Pt also with trigger points in upper trap, rhomboids and levator scapula (L>R) so may benefit from TDN in the future.  Pt open to dry needling but at this time requesting to hold due to outside factors.  Pt currently with increased stress at home as husband passed away last week.  Recommended pt attend PT as able but understand if unable to attend.   PT Treatment/Interventions ADLs/Self Care Home Management;Cryotherapy;Electrical Stimulation;Moist Heat;Ultrasound;Neuromuscular re-education;Therapeutic exercise;Therapeutic activities;Functional mobility training;Stair training;Gait training;Patient/family education;Manual techniques;Taping;Dry needling   PT Next Visit Plan TDN if pt agreeable to gluts/piriformis, review stretches, core/hip stability and modalities   Consulted and Agree  with Plan of Care Patient      Patient will benefit from skilled therapeutic intervention in order to improve the following deficits and impairments:  Decreased activity tolerance, Decreased mobility, Difficulty walking, Decreased strength, Decreased range of motion, Impaired flexibility, Increased fascial restricitons, Pain, Postural dysfunction  Visit Diagnosis: Chronic midline low back pain without sciatica  Abnormal posture  Cervicalgia  Muscle weakness (generalized)     Problem List Patient Active Problem List   Diagnosis Date Noted  . Hyperlipidemia 01/21/2016  .  Screening for colon cancer 12/28/2015  . Piriformis syndrome of right side 10/26/2015  . Arthralgia 09/29/2015  . Thyroid disease   . Metabolic syndrome 06/01/2013  . Abnormal EKG 03/07/2011  . Heart murmur 03/07/2011  . IBS (irritable bowel syndrome)   . Hypertension   . Hypothyroidism 06/12/2010  . MORBID OBESITY 06/12/2010      Clarita Crane, PT, DPT 06/06/17 2:38 PM    [facility] [address] [place], [place], [address] Phone: [phone]   Fax:  [phone]  Name: Allison Bell MRN: [id number] Date of Birth: 05/15/1951

## 2017-06-06 NOTE — Patient Instructions (Signed)
Flexibility: Upper Trapezius Stretch    Gently grasp right side of head while holding edge of chair with other hand. Tilt head away until a gentle stretch is felt. Hold __30__ seconds.  Repeat to other side. Repeat _2-3___ times per set. Do __1__ sets per session. Do _2-3___ sessions per day.   Levator Scapula Stretch, Sitting    Sit, one hand holding onto side of chair on side to be stretched, other hand over top of head. Turn head toward other side and look down. Use hand on head to gently stretch neck in that position. Repeat to other side.  Hold _30__ seconds. Repeat __2-3_ times per session. Do _2-3__ sessions per day.   Scapula Adduction With Pectoralis Stretch: Low - Standing   Shoulders at 45 hands even with shoulders, keeping weight through legs, shift weight forward until you feel pull or stretch through the front of your chest. Hold _30__ seconds. Do _2-3__ times, _2-3__ times per day.      Scapular Retraction (Standing)    With arms at sides, pinch shoulder blades together. Repeat __10-15__ times per set. Do __1__ sets per session. Do __2-3__ sessions per day.  Healthy Back - Shoulder Roll    Stand straight with arms relaxed at sides. Roll shoulders backward continuously. Do __10-15__ times.  Perform several sessions throughout the day.

## 2017-06-10 ENCOUNTER — Ambulatory Visit (INDEPENDENT_AMBULATORY_CARE_PROVIDER_SITE_OTHER): Payer: PPO | Admitting: Physical Therapy

## 2017-06-10 DIAGNOSIS — M542 Cervicalgia: Secondary | ICD-10-CM | POA: Diagnosis not present

## 2017-06-10 DIAGNOSIS — M6281 Muscle weakness (generalized): Secondary | ICD-10-CM

## 2017-06-10 DIAGNOSIS — M545 Low back pain: Secondary | ICD-10-CM

## 2017-06-10 DIAGNOSIS — G8929 Other chronic pain: Secondary | ICD-10-CM | POA: Diagnosis not present

## 2017-06-10 DIAGNOSIS — R293 Abnormal posture: Secondary | ICD-10-CM | POA: Diagnosis not present

## 2017-06-10 NOTE — Therapy (Signed)
[facility] [address] [place], [place], [address] Phone: [phone]   Fax:  [phone]  Physical Therapy Treatment  Patient Details  Name: Allison Bell MRN: [id number] Date of Birth: 1951-10-14 Referring Provider: Dr. Terrilee Files  Encounter Date: 06/10/2017      PT End of Session - 06/10/17 1427    Visit Number 3   Number of Visits 12   Date for PT Re-Evaluation 07/08/17   Authorization Type [employer]   PT Start Time 1350   PT Stop Time 1437   PT Time Calculation (min) 47 min   Activity Tolerance Patient tolerated treatment well   Behavior During Therapy [facility] for tasks assessed/performed      Past Medical History:  Diagnosis Date  . Hypertension   . IBS (irritable bowel syndrome)   . Thyroid disease     Past Surgical History:  Procedure Laterality Date  . ABDOMINAL HYSTERECTOMY    . CESAREAN SECTION    . OVARIAN CYST REMOVAL      There were no vitals filed for this visit.      Subjective Assessment - 06/10/17 1350    Subjective neck is a little better; doing the exercises but "not as much as I should."     Patient Stated Goals learn how to maintain flexibility, increase endurance and decrease pain   Pain Score 3    Pain Location Back   Pain Orientation Left;Right   Pain Descriptors / Indicators Aching;Sharp;Constant;Shooting;Tightness   Pain Type Chronic pain   Pain Onset More than a month ago   Pain Frequency Constant   Aggravating Factors  sitting, standing, stress, ADLs (bending forward)   Pain Relieving Factors repositioning, ice   Pain Score 4   Pain Location Neck   Pain Orientation Left;Right;Posterior   Pain Descriptors / Indicators Constant;Stabbing;Tingling   Pain Type Chronic pain   Pain Onset More than a month ago   Pain Frequency Constant   Aggravating Factors  stress   Pain Relieving Factors ice, stretch                         [facility] Adult PT  Treatment/Exercise - 06/10/17 1357      Neck Exercises: Supine   Other Supine Exercise supine decompression x 5 min     Modalities   Modalities Cryotherapy;Electrical Stimulation     Cryotherapy   Number Minutes Cryotherapy 15 Minutes   Cryotherapy Location Cervical   Type of Cryotherapy Ice pack     Electrical Stimulation   Electrical Stimulation Location neck and upper trap   Electrical Stimulation Action IFC   Electrical Stimulation Parameters to tolerance x 15 min   Electrical Stimulation Goals Pain     Manual Therapy   Manual Therapy Soft tissue mobilization;Myofascial release   Soft tissue mobilization Lt upper trap   Myofascial Release Lt levator scapula including trigger point release: pt declined TDN over lung field at this time          Trigger Point Dry Needling - 06/10/17 1424    Consent Given? Yes   Education Handout Provided Yes  given at previous session   Muscles Treated Upper Body Upper trapezius   Upper Trapezius Response Twitch reponse elicited;Palpable increased muscle length              PT Education - 06/10/17 1427    Education provided Yes   Education Details verbally reviewed dry needling   Person(s)  Educated Patient   Methods Explanation   Comprehension Verbalized understanding             PT Long Term Goals - 06/06/17 1427      PT LONG TERM GOAL #1   Title verbalize understanding of posture/body mechanics to decrease risk of reinjury (07/08/17)   Status On-going     PT LONG TERM GOAL #2   Title independent with HEP (07/08/17)   Status On-going     PT LONG TERM GOAL #3   Title perform lumbar ROM without increase in pain for improved function (07/08/17)   Status On-going     PT LONG TERM GOAL #4   Title report ability to stand/walk > 45 min for improved activity tolerance (07/08/17)   Status On-going     PT LONG TERM GOAL #5   Title demonstrate 4/5 strength in UEs for improved function (07/08/17)   Status New     PT  LONG TERM GOAL #6   Title report pain < 4/10 in neck for improved mobility and function (07/08/17)   Status New               Plan - 06/10/17 1427    Clinical Impression Statement Pt tolerated dry needling to Lt upper trap well today with twitch responses noted.  Pt with trigger point in levator but at this time wishes to hold and will consider at a later date.  Pain slowly improving.  Will continue to benefit from PT to maximize function.   PT Treatment/Interventions ADLs/Self Care Home Management;Cryotherapy;Electrical Stimulation;Moist Heat;Ultrasound;Neuromuscular re-education;Therapeutic exercise;Therapeutic activities;Functional mobility training;Stair training;Gait training;Patient/family education;Manual techniques;Taping;Dry needling   PT Next Visit Plan assess response to TDN, give decompression exercises   Consulted and Agree with Plan of Care Patient      Patient will benefit from skilled therapeutic intervention in order to improve the following deficits and impairments:  Decreased activity tolerance, Decreased mobility, Difficulty walking, Decreased strength, Decreased range of motion, Impaired flexibility, Increased fascial restricitons, Pain, Postural dysfunction  Visit Diagnosis: Chronic midline low back pain without sciatica  Abnormal posture  Cervicalgia  Muscle weakness (generalized)     Problem List Patient Active Problem List   Diagnosis Date Noted  . Hyperlipidemia 01/21/2016  . Screening for colon cancer 12/28/2015  . Piriformis syndrome of right side 10/26/2015  . Arthralgia 09/29/2015  . Thyroid disease   . Metabolic syndrome 06/01/2013  . Abnormal EKG 03/07/2011  . Heart murmur 03/07/2011  . IBS (irritable bowel syndrome)   . Hypertension   . Hypothyroidism 06/12/2010  . MORBID OBESITY 06/12/2010      Clarita CraneStephanie F Media Pizzini, PT, DPT 06/10/17 3:00 PM    [place]  [facility] [address] [place],  [place], [address] Phone: [phone]   Fax:  [phone]  Name: Nanetta BattyYvonne S Stepney MRN: [id number] Date of Birth: 07/09/1951

## 2017-06-13 ENCOUNTER — Ambulatory Visit (INDEPENDENT_AMBULATORY_CARE_PROVIDER_SITE_OTHER): Payer: PPO | Admitting: Physical Therapy

## 2017-06-13 DIAGNOSIS — M545 Low back pain, unspecified: Secondary | ICD-10-CM

## 2017-06-13 DIAGNOSIS — G8929 Other chronic pain: Secondary | ICD-10-CM

## 2017-06-13 DIAGNOSIS — M542 Cervicalgia: Secondary | ICD-10-CM

## 2017-06-13 DIAGNOSIS — M6281 Muscle weakness (generalized): Secondary | ICD-10-CM

## 2017-06-13 DIAGNOSIS — R293 Abnormal posture: Secondary | ICD-10-CM | POA: Diagnosis not present

## 2017-06-13 NOTE — Therapy (Signed)
[facility] [address] [place], [place], [address] Phone: [phone]   Fax:  [phone]  Physical Therapy Treatment  Patient Details  Name: Allison Bell MRN: [id number] Date of Birth: 04/03/1951 Referring Provider: Dr. Terrilee FilesZach Smith  Encounter Date: 06/13/2017      PT End of Session - 06/13/17 1423    Visit Number 4   Number of Visits 12   Date for PT Re-Evaluation 07/08/17   Authorization Type [employer]   PT Start Time 1347   PT Stop Time 1435   PT Time Calculation (min) 48 min   Activity Tolerance Patient tolerated treatment well   Behavior During Therapy [facility] for tasks assessed/performed      Past Medical History:  Diagnosis Date  . Hypertension   . IBS (irritable bowel syndrome)   . Thyroid disease     Past Surgical History:  Procedure Laterality Date  . ABDOMINAL HYSTERECTOMY    . CESAREAN SECTION    . OVARIAN CYST REMOVAL      There were no vitals filed for this visit.      Subjective Assessment - 06/13/17 1351    Subjective felt a difference following the needling last session.     Pertinent History obesity, HTN   Patient Stated Goals learn how to maintain flexibility, increase endurance and decrease pain   Currently in Pain? Yes   Pain Score 4    Pain Location Back   Pain Orientation Left;Right   Pain Descriptors / Indicators Aching;Constant;Sharp;Shooting;Tightness   Pain Type Chronic pain   Pain Onset More than a month ago   Pain Frequency Constant   Pain Score 2   Pain Location Neck   Pain Orientation Left;Right;Posterior   Pain Descriptors / Indicators Constant;Tingling;Stabbing   Pain Type Chronic pain   Pain Onset More than a month ago   Pain Frequency Constant                         [facility] Adult PT Treatment/Exercise - 06/13/17 1409      Lumbar Exercises: Stretches   Single Knee to Chest Stretch 2 reps;20 seconds   Lower Trunk Rotation 2 reps;20 seconds      Lumbar Exercises: Supine   Other Supine Lumbar Exercises lumbar decompression exercies performed-see pt instructions     Modalities   Modalities Cryotherapy;Electrical Stimulation     Cryotherapy   Number Minutes Cryotherapy 15 Minutes   Cryotherapy Location Cervical   Type of Cryotherapy Ice pack     Electrical Stimulation   Electrical Stimulation Location neck and upper trap   Electrical Stimulation Action IFC   Electrical Stimulation Parameters to tolerance x 15 min   Electrical Stimulation Goals Pain     Manual Therapy   Manual Therapy Soft tissue mobilization;Myofascial release   Soft tissue mobilization Rt upper trap   Myofascial Release Rt levator scapula and upper trap          Trigger Point Dry Needling - 06/13/17 1412    Consent Given? Yes   Education Handout Provided Yes   Muscles Treated Upper Body Upper trapezius  Rt   Upper Trapezius Response Twitch reponse elicited;Palpable increased muscle length              PT Education - 06/13/17 1423    Education provided Yes   Education Details decompression exercises   Person(s) Educated Patient   Methods Explanation;Demonstration;Handout   Comprehension Verbalized understanding;Returned demonstration  PT Long Term Goals - 06/06/17 1427      PT LONG TERM GOAL #1   Title verbalize understanding of posture/body mechanics to decrease risk of reinjury (07/08/17)   Status On-going     PT LONG TERM GOAL #2   Title independent with HEP (07/08/17)   Status On-going     PT LONG TERM GOAL #3   Title perform lumbar ROM without increase in pain for improved function (07/08/17)   Status On-going     PT LONG TERM GOAL #4   Title report ability to stand/walk > 45 min for improved activity tolerance (07/08/17)   Status On-going     PT LONG TERM GOAL #5   Title demonstrate 4/5 strength in UEs for improved function (07/08/17)   Status New     PT LONG TERM GOAL #6   Title report pain < 4/10  in neck for improved mobility and function (07/08/17)   Status New               Plan - 06/13/17 1423    Clinical Impression Statement Pt reports dry needling for Lt UT was beneficial and requested to have on Rt side today.  Provided decompression exercises to help with back and neck pain.  Tolerated session well and will continue to benefit from PT.   PT Treatment/Interventions ADLs/Self Care Home Management;Cryotherapy;Electrical Stimulation;Moist Heat;Ultrasound;Neuromuscular re-education;Therapeutic exercise;Therapeutic activities;Functional mobility training;Stair training;Gait training;Patient/family education;Manual techniques;Taping;Dry needling   PT Next Visit Plan review decompression exercises, possible TDN to gluts/QL, continue stretching/strengthening   Consulted and Agree with Plan of Care Patient      Patient will benefit from skilled therapeutic intervention in order to improve the following deficits and impairments:  Decreased activity tolerance, Decreased mobility, Difficulty walking, Decreased strength, Decreased range of motion, Impaired flexibility, Increased fascial restricitons, Pain, Postural dysfunction  Visit Diagnosis: Chronic midline low back pain without sciatica  Abnormal posture  Cervicalgia  Muscle weakness (generalized)     Problem List Patient Active Problem List   Diagnosis Date Noted  . Hyperlipidemia 01/21/2016  . Screening for colon cancer 12/28/2015  . Piriformis syndrome of right side 10/26/2015  . Arthralgia 09/29/2015  . Thyroid disease   . Metabolic syndrome 06/01/2013  . Abnormal EKG 03/07/2011  . Heart murmur 03/07/2011  . IBS (irritable bowel syndrome)   . Hypertension   . Hypothyroidism 06/12/2010  . MORBID OBESITY 06/12/2010      Clarita CraneStephanie F [name] Emmons, PT, DPT 06/13/17 2:26 PM    [place] [facility] [address] [place], [place], [address] Phone: [phone]   Fax:   [phone]  Name: Allison Bell MRN: [id number] Date of Birth: 06/19/1951

## 2017-06-17 ENCOUNTER — Ambulatory Visit (INDEPENDENT_AMBULATORY_CARE_PROVIDER_SITE_OTHER): Payer: PPO | Admitting: Physical Therapy

## 2017-06-17 DIAGNOSIS — M6281 Muscle weakness (generalized): Secondary | ICD-10-CM

## 2017-06-17 DIAGNOSIS — R293 Abnormal posture: Secondary | ICD-10-CM

## 2017-06-17 DIAGNOSIS — M542 Cervicalgia: Secondary | ICD-10-CM | POA: Diagnosis not present

## 2017-06-17 DIAGNOSIS — G8929 Other chronic pain: Secondary | ICD-10-CM | POA: Diagnosis not present

## 2017-06-17 DIAGNOSIS — M545 Low back pain: Secondary | ICD-10-CM

## 2017-06-17 NOTE — Patient Instructions (Addendum)
   Review the You Tube Video:  Understanding Pain in Less Than 5 Minutes [email or web address]/[email or web address]   TENS UNIT  This is helpful for muscle pain and spasm.   Search and Purchase a TENS 7000 2nd edition at www.tenspros.com or www.amazon.com  (It should be less than $30)     TENS unit instructions:   Do not shower or bathe with the unit on  Turn the unit off before removing electrodes or batteries  If the electrodes lose stickiness add a drop of water to the electrodes after they are disconnected from the unit and place on plastic sheet. If you continued to have difficulty, call the TENS unit company to purchase more electrodes.  Do not apply lotion on the skin area prior to use. Make sure the skin is clean and dry as this will help prolong the life of the electrodes.  After use, always check skin for unusual red areas, rash or other skin difficulties. If there are any skin problems, does not apply electrodes to the same area.  Never remove the electrodes from the unit by pulling the wires.  Do not use the TENS unit or electrodes other than as directed.  Do not change electrode placement without consulting your therapist or physician.  Keep 2 fingers with between each electrode.

## 2017-06-17 NOTE — Therapy (Addendum)
[facility] [address] [place], [place], [address] Phone: [phone]   Fax:  [phone]  Physical Therapy Treatment/Discharge  Patient Details  Name: Allison Bell MRN: [id number] Date of Birth: Sep 21, 1951 Referring Provider: Dr. Charlann Boxer  Encounter Date: 06/17/2017      PT End of Session - 06/17/17 1432    Visit Number 5   Number of Visits 12   Date for PT Re-Evaluation 07/08/17   Authorization Type [employer]   PT Start Time 1350   PT Stop Time 1436   PT Time Calculation (min) 46 min   Activity Tolerance Patient tolerated treatment well   Behavior During Therapy [facility] for tasks assessed/performed      Past Medical History:  Diagnosis Date  . Hypertension   . IBS (irritable bowel syndrome)   . Thyroid disease     Past Surgical History:  Procedure Laterality Date  . ABDOMINAL HYSTERECTOMY    . CESAREAN SECTION    . OVARIAN CYST REMOVAL      There were no vitals filed for this visit.      Subjective Assessment - 06/17/17 1351    Subjective upper traps feel better but still having some pain in rhomboids or upper trap.  hasn't been too consistent with HEP.     Patient Stated Goals learn how to maintain flexibility, increase endurance and decrease pain   Currently in Pain? Yes   Pain Score 3    Pain Location Back   Pain Orientation Right;Left;Lower   Pain Descriptors / Indicators Aching;Constant;Sharp;Shooting;Tightness   Pain Type Chronic pain   Pain Onset More than a month ago   Pain Frequency Constant   Aggravating Factors  sitting, standing, stress, ADLs (bending forward)   Pain Relieving Factors repositioning, ice                         [facility] Adult PT Treatment/Exercise - 06/17/17 1412      Lumbar Exercises: Supine   Other Supine Lumbar Exercises lumbar decompression exercies performed     Cryotherapy   Number Minutes Cryotherapy 15 Minutes   Cryotherapy Location  Cervical;Lumbar Spine   Type of Cryotherapy Ice pack     Electrical Stimulation   Electrical Stimulation Location neck and upper trap   Electrical Stimulation Action IFC   Electrical Stimulation Parameters to tolerance x 15 min   Electrical Stimulation Goals Pain                PT Education - 06/17/17 1431    Education provided Yes   Education Details long discussion with pt: current situation and appropriateness for PT; pt wishing to hold at this time as she's overwhelmed with home responsibilities; provided information on TENS unit, cold packs and "understanding pain" You Tube video   Person(s) Educated Patient   Methods Explanation;Handout   Comprehension Verbalized understanding             PT Long Term Goals - 06/17/17 1433      PT LONG TERM GOAL #1   Title verbalize understanding of posture/body mechanics to decrease risk of reinjury (07/08/17)   Status On-going     PT LONG TERM GOAL #2   Title independent with HEP (07/08/17)   Status On-going     PT LONG TERM GOAL #3   Title perform lumbar ROM without increase in pain for improved function (07/08/17)   Status On-going     PT LONG TERM  GOAL #4   Title report ability to stand/walk > 45 min for improved activity tolerance (07/08/17)   Status On-going     PT LONG TERM GOAL #5   Title demonstrate 4/5 strength in UEs for improved function (07/08/17)   Status On-going     PT LONG TERM GOAL #6   Title report pain < 4/10 in neck for improved mobility and function (07/08/17)   Status On-going               Plan - 06/27/17 1433    Clinical Impression Statement Deferred goal testing at this time as pt reports minimal improvement, but also requesting to hold PT.  Long discussion with pt about current home responsibilities and at this time she is feeling very overwhelmed due to recent passing of her husband.  Agreed to return to PT when pt able to dedicate more time to exercise.  Note sent to MD.   PT  Treatment/Interventions ADLs/Self Care Home Management;Cryotherapy;Electrical Stimulation;Moist Heat;Ultrasound;Neuromuscular re-education;Therapeutic exercise;Therapeutic activities;Functional mobility training;Stair training;Gait training;Patient/family education;Manual techniques;Taping;Dry needling   PT Next Visit Plan hold PT, pt to return when able.  If > 30 days will need new referral; will need new g-code   Consulted and Agree with Plan of Care Patient      Patient will benefit from skilled therapeutic intervention in order to improve the following deficits and impairments:  Decreased activity tolerance, Decreased mobility, Difficulty walking, Decreased strength, Decreased range of motion, Impaired flexibility, Increased fascial restricitons, Pain, Postural dysfunction  Visit Diagnosis: Chronic midline low back pain without sciatica  Abnormal posture  Cervicalgia  Muscle weakness (generalized)       [facility] PT PB G-CODES - 27-Jun-2017 1435    Functional Assessment Tool Used  clinical judgement   Functional Limitations Changing and maintaining body position   Changing and Maintaining Body Position Goal Status (K9983) At least 1 percent but less than 20 percent impaired, limited or restricted   Changing and Maintaining Body Position Discharge Status (J8250) At least 40 percent but less than 60 percent impaired, limited or restricted      Problem List Patient Active Problem List   Diagnosis Date Noted  . Hyperlipidemia 01/21/2016  . Screening for colon cancer 12/28/2015  . Piriformis syndrome of right side 10/26/2015  . Arthralgia 09/29/2015  . Thyroid disease   . Metabolic syndrome 53/97/6734  . Abnormal EKG 03/07/2011  . Heart murmur 03/07/2011  . IBS (irritable bowel syndrome)   . Hypertension   . Hypothyroidism 06/12/2010  . MORBID OBESITY 06/12/2010      Laureen Abrahams, PT, DPT 06-27-2017 2:36 PM    [place], [place], [address] Phone: [phone]   Fax:  [phone]  Name: Allison Bell MRN: [id number] Date of Birth: 1951-10-27      PHYSICAL THERAPY DISCHARGE SUMMARY  Visits from Start of Care: 5  Current functional level related to goals / functional outcomes: See above   Remaining deficits: See above   Education / Equipment: HEP  Plan: Patient agrees to discharge.  Patient goals were not met. Patient is being discharged due to the patient's request.  ?????     Held PT due to increased stress with home life, pt didn't call back to return.  Laureen Abrahams, PT, DPT 08/16/17 9:42 AM   [facility] [address] [place], [place], [address] Phone: [phone]  Fax: [phone]

## 2017-06-25 DIAGNOSIS — M9901 Segmental and somatic dysfunction of cervical region: Secondary | ICD-10-CM | POA: Diagnosis not present

## 2017-06-25 DIAGNOSIS — M9902 Segmental and somatic dysfunction of thoracic region: Secondary | ICD-10-CM | POA: Diagnosis not present

## 2017-06-25 DIAGNOSIS — S63529A Sprain of radiocarpal joint of unspecified wrist, initial encounter: Secondary | ICD-10-CM | POA: Diagnosis not present

## 2017-06-25 DIAGNOSIS — M9907 Segmental and somatic dysfunction of upper extremity: Secondary | ICD-10-CM | POA: Diagnosis not present

## 2017-06-25 DIAGNOSIS — M9904 Segmental and somatic dysfunction of sacral region: Secondary | ICD-10-CM | POA: Diagnosis not present

## 2017-06-25 DIAGNOSIS — M9903 Segmental and somatic dysfunction of lumbar region: Secondary | ICD-10-CM | POA: Diagnosis not present

## 2017-06-25 DIAGNOSIS — M5124 Other intervertebral disc displacement, thoracic region: Secondary | ICD-10-CM | POA: Diagnosis not present

## 2017-06-25 DIAGNOSIS — S93629A Sprain of tarsometatarsal ligament of unspecified foot, initial encounter: Secondary | ICD-10-CM | POA: Diagnosis not present

## 2017-06-25 DIAGNOSIS — M9906 Segmental and somatic dysfunction of lower extremity: Secondary | ICD-10-CM | POA: Diagnosis not present

## 2017-06-25 DIAGNOSIS — M47817 Spondylosis without myelopathy or radiculopathy, lumbosacral region: Secondary | ICD-10-CM | POA: Diagnosis not present

## 2017-07-03 ENCOUNTER — Ambulatory Visit: Payer: PPO | Admitting: Family Medicine

## 2017-07-17 DIAGNOSIS — H43812 Vitreous degeneration, left eye: Secondary | ICD-10-CM | POA: Diagnosis not present

## 2017-07-17 DIAGNOSIS — G43809 Other migraine, not intractable, without status migrainosus: Secondary | ICD-10-CM | POA: Diagnosis not present

## 2017-07-23 DIAGNOSIS — M9901 Segmental and somatic dysfunction of cervical region: Secondary | ICD-10-CM | POA: Diagnosis not present

## 2017-07-23 DIAGNOSIS — M9903 Segmental and somatic dysfunction of lumbar region: Secondary | ICD-10-CM | POA: Diagnosis not present

## 2017-07-23 DIAGNOSIS — S93629A Sprain of tarsometatarsal ligament of unspecified foot, initial encounter: Secondary | ICD-10-CM | POA: Diagnosis not present

## 2017-07-23 DIAGNOSIS — M9902 Segmental and somatic dysfunction of thoracic region: Secondary | ICD-10-CM | POA: Diagnosis not present

## 2017-07-23 DIAGNOSIS — M9907 Segmental and somatic dysfunction of upper extremity: Secondary | ICD-10-CM | POA: Diagnosis not present

## 2017-07-23 DIAGNOSIS — S63529A Sprain of radiocarpal joint of unspecified wrist, initial encounter: Secondary | ICD-10-CM | POA: Diagnosis not present

## 2017-07-23 DIAGNOSIS — M47817 Spondylosis without myelopathy or radiculopathy, lumbosacral region: Secondary | ICD-10-CM | POA: Diagnosis not present

## 2017-07-23 DIAGNOSIS — M9904 Segmental and somatic dysfunction of sacral region: Secondary | ICD-10-CM | POA: Diagnosis not present

## 2017-07-23 DIAGNOSIS — M5124 Other intervertebral disc displacement, thoracic region: Secondary | ICD-10-CM | POA: Diagnosis not present

## 2017-07-23 DIAGNOSIS — M9906 Segmental and somatic dysfunction of lower extremity: Secondary | ICD-10-CM | POA: Diagnosis not present

## 2017-08-22 DIAGNOSIS — M47817 Spondylosis without myelopathy or radiculopathy, lumbosacral region: Secondary | ICD-10-CM | POA: Diagnosis not present

## 2017-08-22 DIAGNOSIS — M9904 Segmental and somatic dysfunction of sacral region: Secondary | ICD-10-CM | POA: Diagnosis not present

## 2017-08-22 DIAGNOSIS — M5124 Other intervertebral disc displacement, thoracic region: Secondary | ICD-10-CM | POA: Diagnosis not present

## 2017-08-22 DIAGNOSIS — M9906 Segmental and somatic dysfunction of lower extremity: Secondary | ICD-10-CM | POA: Diagnosis not present

## 2017-08-22 DIAGNOSIS — M9907 Segmental and somatic dysfunction of upper extremity: Secondary | ICD-10-CM | POA: Diagnosis not present

## 2017-08-22 DIAGNOSIS — S93629A Sprain of tarsometatarsal ligament of unspecified foot, initial encounter: Secondary | ICD-10-CM | POA: Diagnosis not present

## 2017-08-22 DIAGNOSIS — M9903 Segmental and somatic dysfunction of lumbar region: Secondary | ICD-10-CM | POA: Diagnosis not present

## 2017-08-22 DIAGNOSIS — M9901 Segmental and somatic dysfunction of cervical region: Secondary | ICD-10-CM | POA: Diagnosis not present

## 2017-08-22 DIAGNOSIS — S63529A Sprain of radiocarpal joint of unspecified wrist, initial encounter: Secondary | ICD-10-CM | POA: Diagnosis not present

## 2017-08-22 DIAGNOSIS — M9902 Segmental and somatic dysfunction of thoracic region: Secondary | ICD-10-CM | POA: Diagnosis not present

## 2017-09-19 DIAGNOSIS — M5124 Other intervertebral disc displacement, thoracic region: Secondary | ICD-10-CM | POA: Diagnosis not present

## 2017-09-19 DIAGNOSIS — S93629A Sprain of tarsometatarsal ligament of unspecified foot, initial encounter: Secondary | ICD-10-CM | POA: Diagnosis not present

## 2017-09-19 DIAGNOSIS — M9903 Segmental and somatic dysfunction of lumbar region: Secondary | ICD-10-CM | POA: Diagnosis not present

## 2017-09-19 DIAGNOSIS — M9907 Segmental and somatic dysfunction of upper extremity: Secondary | ICD-10-CM | POA: Diagnosis not present

## 2017-09-19 DIAGNOSIS — M9902 Segmental and somatic dysfunction of thoracic region: Secondary | ICD-10-CM | POA: Diagnosis not present

## 2017-09-19 DIAGNOSIS — M9901 Segmental and somatic dysfunction of cervical region: Secondary | ICD-10-CM | POA: Diagnosis not present

## 2017-09-19 DIAGNOSIS — S63529A Sprain of radiocarpal joint of unspecified wrist, initial encounter: Secondary | ICD-10-CM | POA: Diagnosis not present

## 2017-09-19 DIAGNOSIS — M9904 Segmental and somatic dysfunction of sacral region: Secondary | ICD-10-CM | POA: Diagnosis not present

## 2017-09-19 DIAGNOSIS — M47817 Spondylosis without myelopathy or radiculopathy, lumbosacral region: Secondary | ICD-10-CM | POA: Diagnosis not present

## 2017-09-19 DIAGNOSIS — M9906 Segmental and somatic dysfunction of lower extremity: Secondary | ICD-10-CM | POA: Diagnosis not present

## 2017-10-15 DIAGNOSIS — S63529A Sprain of radiocarpal joint of unspecified wrist, initial encounter: Secondary | ICD-10-CM | POA: Diagnosis not present

## 2017-10-15 DIAGNOSIS — M9902 Segmental and somatic dysfunction of thoracic region: Secondary | ICD-10-CM | POA: Diagnosis not present

## 2017-10-15 DIAGNOSIS — M9906 Segmental and somatic dysfunction of lower extremity: Secondary | ICD-10-CM | POA: Diagnosis not present

## 2017-10-15 DIAGNOSIS — M9901 Segmental and somatic dysfunction of cervical region: Secondary | ICD-10-CM | POA: Diagnosis not present

## 2017-10-15 DIAGNOSIS — M5124 Other intervertebral disc displacement, thoracic region: Secondary | ICD-10-CM | POA: Diagnosis not present

## 2017-10-15 DIAGNOSIS — M9907 Segmental and somatic dysfunction of upper extremity: Secondary | ICD-10-CM | POA: Diagnosis not present

## 2017-10-15 DIAGNOSIS — S93629A Sprain of tarsometatarsal ligament of unspecified foot, initial encounter: Secondary | ICD-10-CM | POA: Diagnosis not present

## 2017-10-15 DIAGNOSIS — M9903 Segmental and somatic dysfunction of lumbar region: Secondary | ICD-10-CM | POA: Diagnosis not present

## 2017-10-15 DIAGNOSIS — M9904 Segmental and somatic dysfunction of sacral region: Secondary | ICD-10-CM | POA: Diagnosis not present

## 2017-10-15 DIAGNOSIS — M47817 Spondylosis without myelopathy or radiculopathy, lumbosacral region: Secondary | ICD-10-CM | POA: Diagnosis not present

## 2017-11-07 DIAGNOSIS — S93629A Sprain of tarsometatarsal ligament of unspecified foot, initial encounter: Secondary | ICD-10-CM | POA: Diagnosis not present

## 2017-11-07 DIAGNOSIS — M5124 Other intervertebral disc displacement, thoracic region: Secondary | ICD-10-CM | POA: Diagnosis not present

## 2017-11-07 DIAGNOSIS — M47817 Spondylosis without myelopathy or radiculopathy, lumbosacral region: Secondary | ICD-10-CM | POA: Diagnosis not present

## 2017-11-07 DIAGNOSIS — M9904 Segmental and somatic dysfunction of sacral region: Secondary | ICD-10-CM | POA: Diagnosis not present

## 2017-11-07 DIAGNOSIS — M9901 Segmental and somatic dysfunction of cervical region: Secondary | ICD-10-CM | POA: Diagnosis not present

## 2017-11-07 DIAGNOSIS — M9902 Segmental and somatic dysfunction of thoracic region: Secondary | ICD-10-CM | POA: Diagnosis not present

## 2017-11-07 DIAGNOSIS — M9906 Segmental and somatic dysfunction of lower extremity: Secondary | ICD-10-CM | POA: Diagnosis not present

## 2017-11-07 DIAGNOSIS — M9907 Segmental and somatic dysfunction of upper extremity: Secondary | ICD-10-CM | POA: Diagnosis not present

## 2017-11-07 DIAGNOSIS — S63529A Sprain of radiocarpal joint of unspecified wrist, initial encounter: Secondary | ICD-10-CM | POA: Diagnosis not present

## 2017-11-07 DIAGNOSIS — M9903 Segmental and somatic dysfunction of lumbar region: Secondary | ICD-10-CM | POA: Diagnosis not present

## 2017-12-12 DIAGNOSIS — M9907 Segmental and somatic dysfunction of upper extremity: Secondary | ICD-10-CM | POA: Diagnosis not present

## 2017-12-12 DIAGNOSIS — M9904 Segmental and somatic dysfunction of sacral region: Secondary | ICD-10-CM | POA: Diagnosis not present

## 2017-12-12 DIAGNOSIS — M47817 Spondylosis without myelopathy or radiculopathy, lumbosacral region: Secondary | ICD-10-CM | POA: Diagnosis not present

## 2017-12-12 DIAGNOSIS — M9902 Segmental and somatic dysfunction of thoracic region: Secondary | ICD-10-CM | POA: Diagnosis not present

## 2017-12-12 DIAGNOSIS — M9906 Segmental and somatic dysfunction of lower extremity: Secondary | ICD-10-CM | POA: Diagnosis not present

## 2017-12-12 DIAGNOSIS — M9903 Segmental and somatic dysfunction of lumbar region: Secondary | ICD-10-CM | POA: Diagnosis not present

## 2017-12-12 DIAGNOSIS — M5124 Other intervertebral disc displacement, thoracic region: Secondary | ICD-10-CM | POA: Diagnosis not present

## 2017-12-12 DIAGNOSIS — S63529A Sprain of radiocarpal joint of unspecified wrist, initial encounter: Secondary | ICD-10-CM | POA: Diagnosis not present

## 2017-12-12 DIAGNOSIS — S93629A Sprain of tarsometatarsal ligament of unspecified foot, initial encounter: Secondary | ICD-10-CM | POA: Diagnosis not present

## 2017-12-12 DIAGNOSIS — M9901 Segmental and somatic dysfunction of cervical region: Secondary | ICD-10-CM | POA: Diagnosis not present

## 2017-12-26 ENCOUNTER — Telehealth: Payer: Self-pay | Admitting: Pediatrics

## 2018-01-07 DIAGNOSIS — M9902 Segmental and somatic dysfunction of thoracic region: Secondary | ICD-10-CM | POA: Diagnosis not present

## 2018-01-07 DIAGNOSIS — S93629A Sprain of tarsometatarsal ligament of unspecified foot, initial encounter: Secondary | ICD-10-CM | POA: Diagnosis not present

## 2018-01-07 DIAGNOSIS — M9907 Segmental and somatic dysfunction of upper extremity: Secondary | ICD-10-CM | POA: Diagnosis not present

## 2018-01-07 DIAGNOSIS — M5124 Other intervertebral disc displacement, thoracic region: Secondary | ICD-10-CM | POA: Diagnosis not present

## 2018-01-07 DIAGNOSIS — S63529A Sprain of radiocarpal joint of unspecified wrist, initial encounter: Secondary | ICD-10-CM | POA: Diagnosis not present

## 2018-01-07 DIAGNOSIS — M9904 Segmental and somatic dysfunction of sacral region: Secondary | ICD-10-CM | POA: Diagnosis not present

## 2018-01-07 DIAGNOSIS — M9906 Segmental and somatic dysfunction of lower extremity: Secondary | ICD-10-CM | POA: Diagnosis not present

## 2018-01-07 DIAGNOSIS — M47817 Spondylosis without myelopathy or radiculopathy, lumbosacral region: Secondary | ICD-10-CM | POA: Diagnosis not present

## 2018-01-07 DIAGNOSIS — M9903 Segmental and somatic dysfunction of lumbar region: Secondary | ICD-10-CM | POA: Diagnosis not present

## 2018-01-07 DIAGNOSIS — M9901 Segmental and somatic dysfunction of cervical region: Secondary | ICD-10-CM | POA: Diagnosis not present

## 2018-02-04 DIAGNOSIS — M9907 Segmental and somatic dysfunction of upper extremity: Secondary | ICD-10-CM | POA: Diagnosis not present

## 2018-02-04 DIAGNOSIS — S93629A Sprain of tarsometatarsal ligament of unspecified foot, initial encounter: Secondary | ICD-10-CM | POA: Diagnosis not present

## 2018-02-04 DIAGNOSIS — M9903 Segmental and somatic dysfunction of lumbar region: Secondary | ICD-10-CM | POA: Diagnosis not present

## 2018-02-04 DIAGNOSIS — S63529A Sprain of radiocarpal joint of unspecified wrist, initial encounter: Secondary | ICD-10-CM | POA: Diagnosis not present

## 2018-02-04 DIAGNOSIS — M9904 Segmental and somatic dysfunction of sacral region: Secondary | ICD-10-CM | POA: Diagnosis not present

## 2018-02-04 DIAGNOSIS — M9906 Segmental and somatic dysfunction of lower extremity: Secondary | ICD-10-CM | POA: Diagnosis not present

## 2018-02-04 DIAGNOSIS — M5124 Other intervertebral disc displacement, thoracic region: Secondary | ICD-10-CM | POA: Diagnosis not present

## 2018-02-04 DIAGNOSIS — M47817 Spondylosis without myelopathy or radiculopathy, lumbosacral region: Secondary | ICD-10-CM | POA: Diagnosis not present

## 2018-02-04 DIAGNOSIS — M9901 Segmental and somatic dysfunction of cervical region: Secondary | ICD-10-CM | POA: Diagnosis not present

## 2018-02-04 DIAGNOSIS — M9902 Segmental and somatic dysfunction of thoracic region: Secondary | ICD-10-CM | POA: Diagnosis not present

## 2018-02-11 DIAGNOSIS — M9906 Segmental and somatic dysfunction of lower extremity: Secondary | ICD-10-CM | POA: Diagnosis not present

## 2018-02-11 DIAGNOSIS — M9903 Segmental and somatic dysfunction of lumbar region: Secondary | ICD-10-CM | POA: Diagnosis not present

## 2018-02-11 DIAGNOSIS — M9901 Segmental and somatic dysfunction of cervical region: Secondary | ICD-10-CM | POA: Diagnosis not present

## 2018-02-11 DIAGNOSIS — M9907 Segmental and somatic dysfunction of upper extremity: Secondary | ICD-10-CM | POA: Diagnosis not present

## 2018-02-11 DIAGNOSIS — M5124 Other intervertebral disc displacement, thoracic region: Secondary | ICD-10-CM | POA: Diagnosis not present

## 2018-02-11 DIAGNOSIS — S63529A Sprain of radiocarpal joint of unspecified wrist, initial encounter: Secondary | ICD-10-CM | POA: Diagnosis not present

## 2018-02-11 DIAGNOSIS — M47817 Spondylosis without myelopathy or radiculopathy, lumbosacral region: Secondary | ICD-10-CM | POA: Diagnosis not present

## 2018-02-11 DIAGNOSIS — M9902 Segmental and somatic dysfunction of thoracic region: Secondary | ICD-10-CM | POA: Diagnosis not present

## 2018-02-11 DIAGNOSIS — S93629A Sprain of tarsometatarsal ligament of unspecified foot, initial encounter: Secondary | ICD-10-CM | POA: Diagnosis not present

## 2018-02-11 DIAGNOSIS — M9904 Segmental and somatic dysfunction of sacral region: Secondary | ICD-10-CM | POA: Diagnosis not present

## 2018-02-18 DIAGNOSIS — M9904 Segmental and somatic dysfunction of sacral region: Secondary | ICD-10-CM | POA: Diagnosis not present

## 2018-02-18 DIAGNOSIS — M9907 Segmental and somatic dysfunction of upper extremity: Secondary | ICD-10-CM | POA: Diagnosis not present

## 2018-02-18 DIAGNOSIS — M5124 Other intervertebral disc displacement, thoracic region: Secondary | ICD-10-CM | POA: Diagnosis not present

## 2018-02-18 DIAGNOSIS — M9903 Segmental and somatic dysfunction of lumbar region: Secondary | ICD-10-CM | POA: Diagnosis not present

## 2018-02-18 DIAGNOSIS — M9906 Segmental and somatic dysfunction of lower extremity: Secondary | ICD-10-CM | POA: Diagnosis not present

## 2018-02-18 DIAGNOSIS — M9901 Segmental and somatic dysfunction of cervical region: Secondary | ICD-10-CM | POA: Diagnosis not present

## 2018-02-18 DIAGNOSIS — M47817 Spondylosis without myelopathy or radiculopathy, lumbosacral region: Secondary | ICD-10-CM | POA: Diagnosis not present

## 2018-02-18 DIAGNOSIS — S93629A Sprain of tarsometatarsal ligament of unspecified foot, initial encounter: Secondary | ICD-10-CM | POA: Diagnosis not present

## 2018-02-18 DIAGNOSIS — M9902 Segmental and somatic dysfunction of thoracic region: Secondary | ICD-10-CM | POA: Diagnosis not present

## 2018-02-18 DIAGNOSIS — S63529A Sprain of radiocarpal joint of unspecified wrist, initial encounter: Secondary | ICD-10-CM | POA: Diagnosis not present

## 2018-03-04 DIAGNOSIS — M9901 Segmental and somatic dysfunction of cervical region: Secondary | ICD-10-CM | POA: Diagnosis not present

## 2018-03-04 DIAGNOSIS — S63529A Sprain of radiocarpal joint of unspecified wrist, initial encounter: Secondary | ICD-10-CM | POA: Diagnosis not present

## 2018-03-04 DIAGNOSIS — M5124 Other intervertebral disc displacement, thoracic region: Secondary | ICD-10-CM | POA: Diagnosis not present

## 2018-03-04 DIAGNOSIS — S93629A Sprain of tarsometatarsal ligament of unspecified foot, initial encounter: Secondary | ICD-10-CM | POA: Diagnosis not present

## 2018-03-04 DIAGNOSIS — M9903 Segmental and somatic dysfunction of lumbar region: Secondary | ICD-10-CM | POA: Diagnosis not present

## 2018-03-04 DIAGNOSIS — M9906 Segmental and somatic dysfunction of lower extremity: Secondary | ICD-10-CM | POA: Diagnosis not present

## 2018-03-04 DIAGNOSIS — M47817 Spondylosis without myelopathy or radiculopathy, lumbosacral region: Secondary | ICD-10-CM | POA: Diagnosis not present

## 2018-03-04 DIAGNOSIS — M9904 Segmental and somatic dysfunction of sacral region: Secondary | ICD-10-CM | POA: Diagnosis not present

## 2018-03-04 DIAGNOSIS — M9902 Segmental and somatic dysfunction of thoracic region: Secondary | ICD-10-CM | POA: Diagnosis not present

## 2018-03-04 DIAGNOSIS — M9907 Segmental and somatic dysfunction of upper extremity: Secondary | ICD-10-CM | POA: Diagnosis not present

## 2018-03-27 DIAGNOSIS — S93629A Sprain of tarsometatarsal ligament of unspecified foot, initial encounter: Secondary | ICD-10-CM | POA: Diagnosis not present

## 2018-03-27 DIAGNOSIS — M9904 Segmental and somatic dysfunction of sacral region: Secondary | ICD-10-CM | POA: Diagnosis not present

## 2018-03-27 DIAGNOSIS — M9903 Segmental and somatic dysfunction of lumbar region: Secondary | ICD-10-CM | POA: Diagnosis not present

## 2018-03-27 DIAGNOSIS — M9906 Segmental and somatic dysfunction of lower extremity: Secondary | ICD-10-CM | POA: Diagnosis not present

## 2018-03-27 DIAGNOSIS — M9902 Segmental and somatic dysfunction of thoracic region: Secondary | ICD-10-CM | POA: Diagnosis not present

## 2018-03-27 DIAGNOSIS — M5124 Other intervertebral disc displacement, thoracic region: Secondary | ICD-10-CM | POA: Diagnosis not present

## 2018-03-27 DIAGNOSIS — M9901 Segmental and somatic dysfunction of cervical region: Secondary | ICD-10-CM | POA: Diagnosis not present

## 2018-03-27 DIAGNOSIS — M47817 Spondylosis without myelopathy or radiculopathy, lumbosacral region: Secondary | ICD-10-CM | POA: Diagnosis not present

## 2018-03-27 DIAGNOSIS — M9907 Segmental and somatic dysfunction of upper extremity: Secondary | ICD-10-CM | POA: Diagnosis not present

## 2018-03-27 DIAGNOSIS — S63529A Sprain of radiocarpal joint of unspecified wrist, initial encounter: Secondary | ICD-10-CM | POA: Diagnosis not present

## 2018-04-22 DIAGNOSIS — S63529A Sprain of radiocarpal joint of unspecified wrist, initial encounter: Secondary | ICD-10-CM | POA: Diagnosis not present

## 2018-04-22 DIAGNOSIS — M47817 Spondylosis without myelopathy or radiculopathy, lumbosacral region: Secondary | ICD-10-CM | POA: Diagnosis not present

## 2018-04-22 DIAGNOSIS — S93629A Sprain of tarsometatarsal ligament of unspecified foot, initial encounter: Secondary | ICD-10-CM | POA: Diagnosis not present

## 2018-04-22 DIAGNOSIS — M9907 Segmental and somatic dysfunction of upper extremity: Secondary | ICD-10-CM | POA: Diagnosis not present

## 2018-04-22 DIAGNOSIS — M9902 Segmental and somatic dysfunction of thoracic region: Secondary | ICD-10-CM | POA: Diagnosis not present

## 2018-04-22 DIAGNOSIS — M9904 Segmental and somatic dysfunction of sacral region: Secondary | ICD-10-CM | POA: Diagnosis not present

## 2018-04-22 DIAGNOSIS — M9906 Segmental and somatic dysfunction of lower extremity: Secondary | ICD-10-CM | POA: Diagnosis not present

## 2018-04-22 DIAGNOSIS — M9901 Segmental and somatic dysfunction of cervical region: Secondary | ICD-10-CM | POA: Diagnosis not present

## 2018-04-22 DIAGNOSIS — M5124 Other intervertebral disc displacement, thoracic region: Secondary | ICD-10-CM | POA: Diagnosis not present

## 2018-04-22 DIAGNOSIS — M9903 Segmental and somatic dysfunction of lumbar region: Secondary | ICD-10-CM | POA: Diagnosis not present

## 2018-04-26 DIAGNOSIS — M9907 Segmental and somatic dysfunction of upper extremity: Secondary | ICD-10-CM | POA: Diagnosis not present

## 2018-04-26 DIAGNOSIS — M9906 Segmental and somatic dysfunction of lower extremity: Secondary | ICD-10-CM | POA: Diagnosis not present

## 2018-04-26 DIAGNOSIS — M47817 Spondylosis without myelopathy or radiculopathy, lumbosacral region: Secondary | ICD-10-CM | POA: Diagnosis not present

## 2018-04-26 DIAGNOSIS — M9904 Segmental and somatic dysfunction of sacral region: Secondary | ICD-10-CM | POA: Diagnosis not present

## 2018-04-26 DIAGNOSIS — M9901 Segmental and somatic dysfunction of cervical region: Secondary | ICD-10-CM | POA: Diagnosis not present

## 2018-04-26 DIAGNOSIS — S63529A Sprain of radiocarpal joint of unspecified wrist, initial encounter: Secondary | ICD-10-CM | POA: Diagnosis not present

## 2018-04-26 DIAGNOSIS — M9903 Segmental and somatic dysfunction of lumbar region: Secondary | ICD-10-CM | POA: Diagnosis not present

## 2018-04-26 DIAGNOSIS — M5124 Other intervertebral disc displacement, thoracic region: Secondary | ICD-10-CM | POA: Diagnosis not present

## 2018-04-26 DIAGNOSIS — S93629A Sprain of tarsometatarsal ligament of unspecified foot, initial encounter: Secondary | ICD-10-CM | POA: Diagnosis not present

## 2018-04-26 DIAGNOSIS — M9902 Segmental and somatic dysfunction of thoracic region: Secondary | ICD-10-CM | POA: Diagnosis not present

## 2018-05-05 ENCOUNTER — Encounter: Payer: Self-pay | Admitting: Family

## 2018-05-05 ENCOUNTER — Ambulatory Visit (INDEPENDENT_AMBULATORY_CARE_PROVIDER_SITE_OTHER): Payer: PPO | Admitting: Family

## 2018-05-05 VITALS — BP 166/81 | HR 86 | Temp 98.5°F | Ht 62.5 in | Wt 215.0 lb

## 2018-05-05 DIAGNOSIS — M5442 Lumbago with sciatica, left side: Secondary | ICD-10-CM | POA: Diagnosis not present

## 2018-05-05 DIAGNOSIS — M5441 Lumbago with sciatica, right side: Secondary | ICD-10-CM | POA: Diagnosis not present

## 2018-05-05 DIAGNOSIS — R030 Elevated blood-pressure reading, without diagnosis of hypertension: Secondary | ICD-10-CM | POA: Diagnosis not present

## 2018-05-05 MED ORDER — DICLOFENAC SODIUM 75 MG PO TBEC: 75.0000 mg | DELAYED_RELEASE_TABLET | Freq: Two times a day (BID) | ORAL | 0 refills | Status: DC

## 2018-05-05 MED ORDER — PREDNISONE 10 MG (21) PO TBPK: ORAL_TABLET | ORAL | 0 refills | Status: DC

## 2018-05-05 NOTE — Patient Instructions (Signed)
Sciatica Sciatica is pain, numbness, weakness, or tingling along the path of the sciatic nerve. The sciatic nerve starts in the lower back and runs down the back of each leg. The nerve controls the muscles in the lower leg and in the back of the knee. It also provides feeling (sensation) to the back of the thigh, the lower leg, and the sole of the foot. Sciatica is a symptom of another medical condition that pinches or puts pressure on the sciatic nerve. Generally, sciatica only affects one side of the body. Sciatica usually goes away on its own or with treatment. In some cases, sciatica may keep coming back (recur). What are the causes? This condition is caused by pressure on the sciatic nerve, or pinching of the sciatic nerve. This may be the result of:  A disk in between the bones of the spine (vertebrae) bulging out too far (herniated disk).  Age-related changes in the spinal disks (degenerative disk disease).  A pain disorder that affects a muscle in the buttock (piriformis syndrome).  Extra bone growth (bone spur) near the sciatic nerve.  An injury or break (fracture) of the pelvis.  Pregnancy.  Tumor (rare).  What increases the risk? The following factors may make you more likely to develop this condition:  Playing sports that place pressure or stress on the spine, such as football or weight lifting.  Having poor strength and flexibility.  A history of back injury.  A history of back surgery.  Sitting for long periods of time.  Doing activities that involve repetitive bending or lifting.  Obesity.  What are the signs or symptoms? Symptoms can vary from mild to very severe, and they may include:  Any of these problems in the lower back, leg, hip, or buttock: ? Mild tingling or dull aches. ? Burning sensations. ? Sharp pains.  Numbness in the back of the calf or the sole of the foot.  Leg weakness.  Severe back pain that makes movement difficult.  These  symptoms may get worse when you cough, sneeze, or laugh, or when you sit or stand for long periods of time. Being overweight may also make symptoms worse. In some cases, symptoms may recur over time. How is this diagnosed? This condition may be diagnosed based on:  Your symptoms.  A physical exam. Your health care provider may ask you to do certain movements to check whether those movements trigger your symptoms.  You may have tests, including: ? Blood tests. ? X-rays. ? MRI. ? CT scan.  How is this treated? In many cases, this condition improves on its own, without any treatment. However, treatment may include:  Reducing or modifying physical activity during periods of pain.  Exercising and stretching to strengthen your abdomen and improve the flexibility of your spine.  Icing and applying heat to the affected area.  Medicines that help: ? To relieve pain and swelling. ? To relax your muscles.  Injections of medicines that help to relieve pain, irritation, and inflammation around the sciatic nerve (steroids).  Surgery.  Follow these instructions at home: Medicines  Take over-the-counter and prescription medicines only as told by your health care provider.  Do not drive or operate heavy machinery while taking prescription pain medicine. Managing pain  If directed, apply ice to the affected area. ? Put ice in a plastic bag. ? Place a towel between your skin and the bag. ? Leave the ice on for 20 minutes, 2-3 times a day.  After icing, apply   heat to the affected area before you exercise or as often as told by your health care provider. Use the heat source that your health care provider recommends, such as a moist heat pack or a heating pad. ? Place a towel between your skin and the heat source. ? Leave the heat on for 20-30 minutes. ? Remove the heat if your skin turns bright red. This is especially important if you are unable to feel pain, heat, or cold. You may have a  greater risk of getting burned. Activity  Return to your normal activities as told by your health care provider. Ask your health care provider what activities are safe for you. ? Avoid activities that make your symptoms worse.  Take brief periods of rest throughout the day. Resting in a lying or standing position is usually better than sitting to rest. ? When you rest for longer periods, mix in some mild activity or stretching between periods of rest. This will help to prevent stiffness and pain. ? Avoid sitting for long periods of time without moving. Get up and move around at least one time each hour.  Exercise and stretch regularly, as told by your health care provider.  Do not lift anything that is heavier than 10 lb (4.5 kg) while you have symptoms of sciatica. When you do not have symptoms, you should still avoid heavy lifting, especially repetitive heavy lifting.  When you lift objects, always use proper lifting technique, which includes: ? Bending your knees. ? Keeping the load close to your body. ? Avoiding twisting. General instructions  Use good posture. ? Avoid leaning forward while sitting. ? Avoid hunching over while standing.  Maintain a healthy weight. Excess weight puts extra stress on your back and makes it difficult to maintain good posture.  Wear supportive, comfortable shoes. Avoid wearing high heels.  Avoid sleeping on a mattress that is too soft or too hard. A mattress that is firm enough to support your back when you sleep may help to reduce your pain.  Keep all follow-up visits as told by your health care provider. This is important. Contact a health care provider if:  You have pain that wakes you up when you are sleeping.  You have pain that gets worse when you lie down.  Your pain is worse than you have experienced in the past.  Your pain lasts longer than 4 weeks.  You experience unexplained weight loss. Get help right away if:  You lose control  of your bowel or bladder (incontinence).  You have: ? Weakness in your lower back, pelvis, buttocks, or legs that gets worse. ? Redness or swelling of your back. ? A burning sensation when you urinate. This information is not intended to replace advice given to you by your health care provider. Make sure you discuss any questions you have with your health care provider. Document Released: 10/30/2001 Document Revised: 04/10/2016 Document Reviewed: 07/15/2015 Elsevier Interactive Patient Education  2018 Elsevier Inc.  

## 2018-05-05 NOTE — Progress Notes (Signed)
   Subjective:    Patient ID: Allison Bell, female    DOB: 04/11/1951, 67 y.o.   MRN: [id number]  Chief Complaint  Patient presents with  . chronic back pain    Back Pain  This is a recurrent problem. The current episode started 1 to 4 weeks ago. The problem occurs intermittently. The problem has been waxing and waning since onset. The pain is present in the lumbar spine and gluteal. The quality of the pain is described as aching. The pain does not radiate. The pain is at a severity of 8/10. The pain is moderate. Associated symptoms include leg pain, numbness and tingling. Pertinent negatives include no bladder incontinence, bowel incontinence or headaches. She has tried bed rest and ice (tumeric) for the symptoms. The treatment provided mild relief.      Review of Systems  Gastrointestinal: Negative for bowel incontinence.  Genitourinary: Negative for bladder incontinence.  Musculoskeletal: Positive for back pain.  Neurological: Positive for tingling and numbness. Negative for headaches.  All other systems reviewed and are negative.      Objective:   Physical Exam  Constitutional: She is oriented to person, place, and time. She appears well-developed and well-nourished. No distress.  HENT:  Head: Normocephalic and atraumatic.  Right Ear: External ear normal.  Left Ear: External ear normal.  Mouth/Throat: Oropharynx is clear and moist.  Eyes: Pupils are equal, round, and reactive to light.  Neck: Normal range of motion. Neck supple. No thyromegaly present.  Cardiovascular: Normal rate, regular rhythm, normal heart sounds and intact distal pulses.  No murmur heard. Pulmonary/Chest: Effort normal and breath sounds normal. No respiratory distress. She has no wheezes.  Abdominal: Soft. Bowel sounds are normal. She exhibits no distension. There is no tenderness.  Musculoskeletal: Normal range of motion. She exhibits no edema or tenderness.  Low back pain in gluteal area that  radiates down into bilateral thighs, negative SLR  Neurological: She is alert and oriented to person, place, and time. She has normal reflexes. No cranial nerve deficit.  Skin: Skin is warm and dry.  Psychiatric: She has a normal mood and affect. Her behavior is normal. Judgment and thought content normal.  Vitals reviewed.     BP (!) 166/81   Pulse 86   Temp 98.5 F (36.9 C) (Oral)   Ht 5' 2.5" (1.588 m)   Wt 215 lb (97.5 kg)   BMI 38.70 kg/m      Assessment & Plan:  Myrene BuddyYvonne was seen today for chronic back pain.  Diagnoses and all orders for this visit:  Acute bilateral low back pain with bilateral sciatica -     predniSONE (STERAPRED UNI-PAK 21 TAB) 10 MG (21) TBPK tablet; Use as directed -     diclofenac (VOLTAREN) 75 MG EC tablet; Take 1 tablet (75 mg total) by mouth 2 (two) times daily.  Elevated blood pressure reading  May be related to pain? Pt will monitor her BP at home and RTO in 2 weeks   Rest Ice ROM exercises No other NSAID's while taking diclofenac, take with food RTO in 2 weeks to recheck HTN and CPE   Jannifer Rodneyhristy Kadia Abaya, FNP

## 2018-05-19 ENCOUNTER — Encounter: Payer: PPO | Admitting: Family

## 2018-11-14 ENCOUNTER — Other Ambulatory Visit: Payer: Self-pay | Admitting: *Deleted

## 2018-11-14 ENCOUNTER — Ambulatory Visit: Payer: PPO

## 2018-11-14 NOTE — Patient Outreach (Signed)
[facility]) Care Management  11/14/2018  Nanetta BattyYvonne S Clagg 06/15/1951 [id number]   Telephone Screen  Referral Date: 11/17/18  Referral Source: Nurse call center  Referral Reason: neck pain and back pain much mor with neck past few days where shoulders attach Pinched nerves and sciatica in lower back Has neck spur states difficulty sleeping If she lays down left thumb and index finger become tingly and throbbing -Nettie ElmJ Gilmartin, RN, see pcp within 3 days Pt was scheduled for an appoint per RN for 5 pm 11/14/18  Insurance: HTA    Outreach attempt # 1 No answer. [facility] RN CM left HIPAA compliant voicemail message along with CM's contact info.   [facility] CM sent an unsuccessful outreach letter and scheduled this patient for another call attempt within 4 business days   [name] Brogden L. Noelle PennerGibbs, RN, BSN, CCM [facility] Telephonic Care Management Care Coordinator Office number [phone] Mobile number [phone]  Main [facility] number [phone] Fax number [phone]

## 2018-11-17 ENCOUNTER — Other Ambulatory Visit: Payer: Self-pay | Admitting: *Deleted

## 2018-11-17 NOTE — Patient Outreach (Signed)
[facility]) Care Management  11/17/2018  Nanetta BattyYvonne S Stoudt 10/25/1951 [id number]   Telephone Screen  Referral Date: 11/17/18  Referral Source: Nurse call center  Referral Reason: neck pain and back pain much more with neck past few days where shoulders attach Pinched nerves and sciatica in lower back Has neck spur states difficulty sleeping If she lays down left thumb and index finger become tingly and throbbing Nettie Elm-J Gilmartin, RN, see pcp within 3 days Pt was scheduled for an appoint per RN for 5 pm 11/14/18  Insurance: HTA    Outreach attempt # 2  Patient is able to verify HIPAA Reviewed and addressed EMMI red alert/[facility]  Mrs Nicoletta BaGwaltney reports she is doing better she confirms she has spoke with her MD and has a follow up appointment 12/09/18 She reports she is continuing to do all the treatment plan,  exercises, stretches and medication recommended to her    Social: She lives with her family. She is independent with all care and denies issues with transportation to medical appointments  Conditions: HTN, IBS, hypothyroidism, heart murmur, morbid obesity , metabolic syndrome, HDL, piriformis syndrome of right side  Medications:denies concerns with taking medications as prescribed, affording medications, side effects of medications and questions about medications  Appointments: 12/09/18 with MD last seen on 11/14/18  Advance Directives: Denies need for assist with advance directives  Consent: [facility] RN [facility]. Patient gave verbal consent for services.  [facility] will close case at this time as patient has been assessed and no needs identified.   Pt encouraged to [facility] CM prn   Kimberly L. Noelle PennerGibbs, RN, BSN, CCM [facility] Telephonic Care Management Care Coordinator Office number [phone] Mobile number [phone]  Main [facility] number [phone] number [phone]

## 2018-12-09 ENCOUNTER — Ambulatory Visit (INDEPENDENT_AMBULATORY_CARE_PROVIDER_SITE_OTHER): Payer: PPO | Admitting: Physician Assistant

## 2018-12-09 ENCOUNTER — Encounter: Payer: Self-pay | Admitting: Physician Assistant

## 2018-12-09 VITALS — BP 172/85 | HR 94 | Ht 62.5 in | Wt 214.2 lb

## 2018-12-09 DIAGNOSIS — Z0001 Encounter for general adult medical examination with abnormal findings: Secondary | ICD-10-CM

## 2018-12-09 DIAGNOSIS — Z0000 Encounter for general adult medical examination without abnormal findings: Secondary | ICD-10-CM

## 2018-12-09 DIAGNOSIS — M6281 Muscle weakness (generalized): Secondary | ICD-10-CM

## 2018-12-10 ENCOUNTER — Other Ambulatory Visit: Payer: PPO

## 2018-12-10 DIAGNOSIS — Z0000 Encounter for general adult medical examination without abnormal findings: Secondary | ICD-10-CM | POA: Diagnosis not present

## 2018-12-10 DIAGNOSIS — M6281 Muscle weakness (generalized): Secondary | ICD-10-CM | POA: Diagnosis not present

## 2018-12-10 LAB — BAYER DCA HB A1C WAIVED: HB A1C (BAYER DCA - WAIVED): 6 % (ref <7.0)

## 2018-12-11 LAB — CBC WITH DIFFERENTIAL/PLATELET
BASOS ABS: 0.1 10*3/uL (ref 0.0–0.2)
Basos: 1 %
EOS (ABSOLUTE): 0.2 10*3/uL (ref 0.0–0.4)
EOS: 2 %
HEMATOCRIT: 40.7 % (ref 34.0–46.6)
HEMOGLOBIN: 14.4 g/dL (ref 11.1–15.9)
Immature Grans (Abs): 0 10*3/uL (ref 0.0–0.1)
Immature Granulocytes: 0 %
LYMPHS ABS: 2.6 10*3/uL (ref 0.7–3.1)
Lymphs: 25 %
MCH: 29.5 pg (ref 26.6–33.0)
MCHC: 35.4 g/dL (ref 31.5–35.7)
MCV: 83 fL (ref 79–97)
MONOCYTES: 8 %
Monocytes Absolute: 0.8 10*3/uL (ref 0.1–0.9)
NEUTROS ABS: 6.6 10*3/uL (ref 1.4–7.0)
Neutrophils: 64 %
Platelets: 297 10*3/uL (ref 150–450)
RBC: 4.88 x10E6/uL (ref 3.77–5.28)
RDW: 12.8 % (ref 11.7–15.4)
WBC: 10.3 10*3/uL (ref 3.4–10.8)

## 2018-12-11 LAB — CMP14+EGFR
A/G RATIO: 2.3 — AB (ref 1.2–2.2)
ALBUMIN: 4.5 g/dL (ref 3.8–4.8)
ALK PHOS: 67 IU/L (ref 39–117)
ALT: 21 IU/L (ref 0–32)
AST: 21 IU/L (ref 0–40)
BILIRUBIN TOTAL: 0.4 mg/dL (ref 0.0–1.2)
BUN / CREAT RATIO: 9 — AB (ref 12–28)
BUN: 6 mg/dL — ABNORMAL LOW (ref 8–27)
CHLORIDE: 100 mmol/L (ref 96–106)
CO2: 25 mmol/L (ref 20–29)
Calcium: 9.2 mg/dL (ref 8.7–10.3)
Creatinine, Ser: 0.7 mg/dL (ref 0.57–1.00)
GFR calc Af Amer: 103 mL/min/{1.73_m2} (ref >59)
GFR calc non Af Amer: 89 mL/min/{1.73_m2} (ref >59)
GLOBULIN, TOTAL: 2 g/dL (ref 1.5–4.5)
Glucose: 127 mg/dL — ABNORMAL HIGH (ref 65–99)
Potassium: 4.2 mmol/L (ref 3.5–5.2)
SODIUM: 140 mmol/L (ref 134–144)
Total Protein: 6.5 g/dL (ref 6.0–8.5)

## 2018-12-11 LAB — LIPID PANEL
CHOLESTEROL TOTAL: 230 mg/dL — AB (ref 100–199)
Chol/HDL Ratio: 3.8 ratio (ref 0.0–4.4)
HDL: 61 mg/dL (ref >39)
LDL CALC: 141 mg/dL — AB (ref 0–99)
Triglycerides: 138 mg/dL (ref 0–149)
VLDL Cholesterol Cal: 28 mg/dL (ref 5–40)

## 2018-12-11 LAB — THYROID PANEL WITH TSH
FREE THYROXINE INDEX: 1.8 (ref 1.2–4.9)
T3 Uptake Ratio: 24 % (ref 24–39)
T4 TOTAL: 7.7 ug/dL (ref 4.5–12.0)
TSH: 3.45 u[IU]/mL (ref 0.450–4.500)

## 2018-12-11 LAB — VITAMIN D 25 HYDROXY (VIT D DEFICIENCY, FRACTURES): VIT D 25 HYDROXY: 54.8 ng/mL (ref 30.0–100.0)

## 2018-12-11 NOTE — Progress Notes (Signed)
BP (!) 172/85   Pulse 94   Ht 5' 2.5" (1.588 m)   Wt 214 lb 3.2 oz (97.2 kg)   BMI 38.55 kg/m    Subjective:    Patient ID: Allison Bell, female    DOB: 15-Oct-1951, 68 y.o.   MRN: [id number]  HPI: Allison Bell is a 68 y.o. female presenting on 12/09/2018 for Annual Exam This patient comes in for an annual exam.  She reports that overall she is doing well.  She does have done scar in her neck with radiation down her left arm.  She does go to acupuncture and chiropractic treatment.  This has helped get things under control pretty well.  She also has had long-term problems with her low back.  She does need lab work performed.  We have had a discussion about her blood pressure and ways to reduce her pressure.  She is going to work on this.  She is gluten sensitive.  She did have a brother with diabetes.  Her mother has coronary artery disease.   Past Medical History:  Diagnosis Date  . Hypertension   . IBS (irritable bowel syndrome)   . Thyroid disease    Relevant past medical, surgical, family and social history reviewed and updated as indicated. Interim medical history since our last visit reviewed. Allergies and medications reviewed and updated. DATA REVIEWED: CHART IN EPIC  Family History reviewed for pertinent findings.  Review of Systems  Constitutional: Negative.  Negative for activity change, fatigue and fever.  HENT: Negative.   Eyes: Negative.   Respiratory: Negative.  Negative for cough.   Cardiovascular: Negative.  Negative for chest pain.  Gastrointestinal: Negative.  Negative for abdominal pain.  Endocrine: Negative.   Genitourinary: Negative.  Negative for dysuria.  Musculoskeletal: Negative.   Skin: Negative.   Neurological: Negative.     Allergies as of 12/09/2018      Reactions   Epinephrine    REACTION: heart pounds and races   Penicillins    REACTION: swelling and rash   Procaine    Vancomycin    REACTION: head throbbing,heart raced, wave of  heat throughout body      Medication List       Accurate as of December 09, 2018 11:59 PM. Always use your most recent med list.        Magnesium Ascorbate Powd by Does not apply route.   NON FORMULARY Thyroid script 1 capsule daily.   TURMERIC PO Take by mouth.   VITAMIN D-3 PO Take by mouth.          Objective:    BP (!) 172/85   Pulse 94   Ht 5' 2.5" (1.588 m)   Wt 214 lb 3.2 oz (97.2 kg)   BMI 38.55 kg/m   Allergies  Allergen Reactions  . Epinephrine     REACTION: heart pounds and races  . Penicillins     REACTION: swelling and rash  . Procaine   . Vancomycin     REACTION: head throbbing,heart raced, wave of heat throughout body    Wt Readings from Last 3 Encounters:  12/09/18 214 lb 3.2 oz (97.2 kg)  05/05/18 215 lb (97.5 kg)  05/15/17 220 lb (99.8 kg)    Physical Exam Constitutional:      Appearance: She is well-developed.  HENT:     Head: Normocephalic and atraumatic.     Right Ear: Tympanic membrane, ear canal and external ear normal.  Left Ear: Tympanic membrane, ear canal and external ear normal.     Nose: Nose normal. No rhinorrhea.     Mouth/Throat:     Pharynx: No oropharyngeal exudate or posterior oropharyngeal erythema.  Eyes:     Conjunctiva/sclera: Conjunctivae normal.     Pupils: Pupils are equal, round, and reactive to light.  Neck:     Musculoskeletal: Normal range of motion and neck supple.  Cardiovascular:     Rate and Rhythm: Normal rate and regular rhythm.     Heart sounds: Normal heart sounds.  Pulmonary:     Effort: Pulmonary effort is normal.     Breath sounds: Normal breath sounds.  Abdominal:     General: Bowel sounds are normal.     Palpations: Abdomen is soft.  Skin:    General: Skin is warm and dry.     Findings: No rash.  Neurological:     Mental Status: She is alert and oriented to person, place, and time.     Deep Tendon Reflexes: Reflexes are normal and symmetric.  Psychiatric:        Behavior:  Behavior normal.        Thought Content: Thought content normal.        Judgment: Judgment normal.     Results for orders placed or performed in visit on 12/09/18  CBC with Differential/Platelet  Result Value Ref Range   WBC 10.3 3.4 - 10.8 x10E3/uL   RBC 4.88 3.77 - 5.28 x10E6/uL   Hemoglobin 14.4 11.1 - 15.9 g/dL   Hematocrit 40.7 34.0 - 46.6 %   MCV 83 79 - 97 fL   MCH 29.5 26.6 - 33.0 pg   MCHC 35.4 31.5 - 35.7 g/dL   RDW 12.8 11.7 - 15.4 %   Platelets 297 150 - 450 x10E3/uL   Neutrophils 64 Not Estab. %   Lymphs 25 Not Estab. %   Monocytes 8 Not Estab. %   Eos 2 Not Estab. %   Basos 1 Not Estab. %   Neutrophils Absolute 6.6 1.4 - 7.0 x10E3/uL   Lymphocytes Absolute 2.6 0.7 - 3.1 x10E3/uL   Monocytes Absolute 0.8 0.1 - 0.9 x10E3/uL   EOS (ABSOLUTE) 0.2 0.0 - 0.4 x10E3/uL   Basophils Absolute 0.1 0.0 - 0.2 x10E3/uL   Immature Granulocytes 0 Not Estab. %   Immature Grans (Abs) 0.0 0.0 - 0.1 x10E3/uL  CMP14+EGFR  Result Value Ref Range   Glucose 127 (H) 65 - 99 mg/dL   BUN 6 (L) 8 - 27 mg/dL   Creatinine, Ser 0.70 0.57 - 1.00 mg/dL   GFR calc non Af Amer 89 >59 mL/min/1.73   GFR calc Af Amer 103 >59 mL/min/1.73   BUN/Creatinine Ratio 9 (L) 12 - 28   Sodium 140 134 - 144 mmol/L   Potassium 4.2 3.5 - 5.2 mmol/L   Chloride 100 96 - 106 mmol/L   CO2 25 20 - 29 mmol/L   Calcium 9.2 8.7 - 10.3 mg/dL   Total Protein 6.5 6.0 - 8.5 g/dL   Albumin 4.5 3.8 - 4.8 g/dL   Globulin, Total 2.0 1.5 - 4.5 g/dL   Albumin/Globulin Ratio 2.3 (H) 1.2 - 2.2   Bilirubin Total 0.4 0.0 - 1.2 mg/dL   Alkaline Phosphatase 67 39 - 117 IU/L   AST 21 0 - 40 IU/L   ALT 21 0 - 32 IU/L  Lipid panel  Result Value Ref Range   Cholesterol, Total 230 (H) 100 - 199 mg/dL  Triglycerides 138 0 - 149 mg/dL   HDL 61 >39 mg/dL   VLDL Cholesterol Cal 28 5 - 40 mg/dL   LDL Calculated 141 (H) 0 - 99 mg/dL   Chol/HDL Ratio 3.8 0.0 - 4.4 ratio  VITAMIN D 25 Hydroxy (Vit-D Deficiency, Fractures)  Result  Value Ref Range   Vit D, 25-Hydroxy 54.8 30.0 - 100.0 ng/mL  Thyroid Panel With TSH  Result Value Ref Range   TSH 3.450 0.450 - 4.500 uIU/mL   T4, Total 7.7 4.5 - 12.0 ug/dL   T3 Uptake Ratio 24 24 - 39 %   Free Thyroxine Index 1.8 1.2 - 4.9  Bayer DCA Hb A1c Waived  Result Value Ref Range   HB A1C (BAYER DCA - WAIVED) 6.0 <7.0 %      Assessment & Plan:   1. Well adult exam - CBC with Differential/Platelet - CMP14+EGFR - Lipid panel - VITAMIN D 25 Hydroxy (Vit-D Deficiency, Fractures) - Thyroid Panel With TSH - Bayer DCA Hb A1c Waived  2. Muscle weakness (generalized) - CBC with Differential/Platelet - Bayer DCA Hb A1c Waived   Continue all other maintenance medications as listed above.  Follow up plan: Return in about 4 months (around 04/09/2019) for bp recheck.  Educational handout given for [place] PA-C [place] [address]  [place], [place] [address] [phone]   12/11/2018, 8:10 PM

## 2019-01-02 ENCOUNTER — Ambulatory Visit: Payer: PPO

## 2019-03-12 ENCOUNTER — Ambulatory Visit: Payer: PPO | Admitting: Physician Assistant

## 2019-04-08 ENCOUNTER — Telehealth: Payer: Self-pay | Admitting: Physician Assistant

## 2019-04-15 ENCOUNTER — Ambulatory Visit: Payer: PPO | Admitting: Physician Assistant

## 2019-05-26 ENCOUNTER — Ambulatory Visit (INDEPENDENT_AMBULATORY_CARE_PROVIDER_SITE_OTHER): Payer: PPO | Admitting: Physician Assistant

## 2019-05-26 ENCOUNTER — Other Ambulatory Visit: Payer: Self-pay

## 2019-05-26 ENCOUNTER — Ambulatory Visit: Payer: PPO | Admitting: Physician Assistant

## 2019-05-26 ENCOUNTER — Encounter: Payer: Self-pay | Admitting: Physician Assistant

## 2019-05-26 DIAGNOSIS — G5701 Lesion of sciatic nerve, right lower limb: Secondary | ICD-10-CM | POA: Diagnosis not present

## 2019-05-26 DIAGNOSIS — M2550 Pain in unspecified joint: Secondary | ICD-10-CM

## 2019-05-26 DIAGNOSIS — R531 Weakness: Secondary | ICD-10-CM | POA: Diagnosis not present

## 2019-05-26 DIAGNOSIS — M792 Neuralgia and neuritis, unspecified: Secondary | ICD-10-CM | POA: Diagnosis not present
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
## 2019-05-27 ENCOUNTER — Ambulatory Visit (HOSPITAL_COMMUNITY)
Admission: EM | Admit: 2019-05-27 | Discharge: 2019-05-27 | Disposition: A | Discharge status: Home / Self Care | Payer: PPO | Attending: Emergency Medicine | Admitting: Emergency Medicine

## 2019-05-27 ENCOUNTER — Other Ambulatory Visit: Payer: Self-pay

## 2019-05-27 ENCOUNTER — Encounter (HOSPITAL_COMMUNITY): Payer: Self-pay

## 2019-05-27 DIAGNOSIS — I10 Essential (primary) hypertension: Secondary | ICD-10-CM | POA: Diagnosis not present

## 2019-05-27 NOTE — ED Provider Notes (Signed)
[facility]    CSN: [id number] Arrival date & time: 05/27/19  1718     History   Chief Complaint Chief Complaint  Patient presents with  . Hypertension    HPI Allison Bell is a 68 y.o. female.   Patient presents today for elevated blood pressure taken at home.  She has been diagnosed with hypertension but has refused to take medication as recommended by her primary care provider.  She also reports intermittent "tingling" on her upper lip over the past 2 weeks which has resolved; she attributes this to her neuralgia and anxiety.  She unsure of her blood pressure reading at home but states she thinks the systolic was above 545; she states her blood pressure machine is "old".  She denies headache, focal weakness, facial asymmetry, slurred speech, chest pain, shortness of breath, nausea, vomiting, diaphoresis, edema, or other symptoms.  The history is provided by the patient.    Past Medical History:  Diagnosis Date  . Hypertension   . IBS (irritable bowel syndrome)   . Thyroid disease     Patient Active Problem List   Diagnosis Date Noted  . Hyperlipidemia 01/21/2016  . Screening for colon cancer 12/28/2015  . Piriformis syndrome of right side 10/26/2015  . Arthralgia 09/29/2015  . Thyroid disease   . Metabolic syndrome 62/56/3893  . Abnormal EKG 03/07/2011  . Heart murmur 03/07/2011  . IBS (irritable bowel syndrome)   . Hypertension   . Hypothyroidism 06/12/2010  . MORBID OBESITY 06/12/2010    Past Surgical History:  Procedure Laterality Date  . ABDOMINAL HYSTERECTOMY    . CESAREAN SECTION    . OVARIAN CYST REMOVAL      OB History   No obstetric history on file.      Home Medications    Prior to Admission medications   Medication Sig Start Date End Date Taking? Authorizing Provider  Cholecalciferol (VITAMIN D-3 PO) Take by mouth.    [provider]  Magnesium Ascorbate POWD by Does not apply route.    [provider]  NON  FORMULARY Thyroid script 1 capsule daily.    [provider]  TURMERIC PO Take by mouth.    [provider]    Family History History reviewed. No pertinent family history.  Social History Social History   Tobacco Use  . Smoking status: Never Smoker  . Smokeless tobacco: Never Used  Substance Use Topics  . Alcohol use: No  . Drug use: No     Allergies   Epinephrine, Penicillins, Procaine, and Vancomycin   Review of Systems Review of Systems  Constitutional: Negative for chills and fever.  HENT: Negative for ear pain and sore throat.   Eyes: Negative for pain and visual disturbance.  Respiratory: Negative for cough and shortness of breath.   Cardiovascular: Negative for chest pain and palpitations.  Gastrointestinal: Negative for abdominal pain and vomiting.  Genitourinary: Negative for dysuria and hematuria.  Musculoskeletal: Negative for arthralgias and gait problem.  Skin: Negative for color change and rash.  Neurological: Negative for dizziness, seizures, syncope, facial asymmetry, speech difficulty, weakness, numbness and headaches.  All other systems reviewed and are negative.    Physical Exam Triage Vital Signs ED Triage Vitals  Enc Vitals Group     BP 05/27/19 1751 (!) 178/97     Pulse Rate 05/27/19 1751 95     Resp 05/27/19 1751 18     Temp 05/27/19 1751 98.6 F (37 C)  Temp src --      SpO2 05/27/19 1751 100 %     Weight 05/27/19 1755 210 lb (95.3 kg)     Height --      Head Circumference --      Peak Flow --      Pain Score 05/27/19 1755 4     Pain Loc --      Pain Edu? --      Excl. in GC? --    No data found.  Updated Vital Signs BP (!) 178/97 (BP Location: Right Arm)   Pulse 95   Temp 98.6 F (37 C)   Resp 18   Wt 210 lb (95.3 kg)   SpO2 100%   BMI 37.80 kg/m   Visual Acuity Right Eye Distance:   Left Eye Distance:   Bilateral Distance:    Right Eye Near:   Left Eye Near:    Bilateral Near:     Physical  Exam Vitals signs and nursing note reviewed.  Constitutional:      General: She is not in acute distress.    Appearance: She is well-developed.  HENT:     Head: Normocephalic and atraumatic.     Right Ear: Tympanic membrane normal.     Left Ear: Tympanic membrane normal.     Mouth/Throat:     Mouth: Mucous membranes are moist.  Eyes:     Extraocular Movements: Extraocular movements intact.     Conjunctiva/sclera: Conjunctivae normal.     Pupils: Pupils are equal, round, and reactive to light.  Neck:     Musculoskeletal: Neck supple.  Cardiovascular:     Rate and Rhythm: Normal rate and regular rhythm.     Heart sounds: Normal heart sounds.  Pulmonary:     Effort: Pulmonary effort is normal. No respiratory distress.     Breath sounds: Normal breath sounds.  Abdominal:     Palpations: Abdomen is soft.     Tenderness: There is no abdominal tenderness.  Musculoskeletal: Normal range of motion.  Skin:    General: Skin is warm and dry.  Neurological:     General: No focal deficit present.     Mental Status: She is alert and oriented to person, place, and time.     Cranial Nerves: No cranial nerve deficit.     Sensory: No sensory deficit.     Motor: No weakness.     Coordination: Coordination normal.     Gait: Gait normal.     Deep Tendon Reflexes: Reflexes normal.    Vitals:   05/27/19 1751 05/27/19 1828  BP: (!) 178/97 (!) 170/82  Pulse: 95   Resp: 18   Temp: 98.6 F (37 C)   SpO2: 100%     UC Treatments / Results  Labs (all labs ordered are listed, but only abnormal results are displayed) Labs Reviewed - No data to display  EKG   Radiology No results found.  Procedures Procedures (including critical care time)  Medications Ordered in UC Medications - No data to display  Initial Impression / Assessment and Plan / UC Course  I have reviewed the triage vital signs and the nursing notes.  Pertinent labs & imaging results that were available during my care  of the patient were reviewed by me and considered in my medical decision making (see chart for details).   Elevated blood pressure with known hypertension.  Discussed with patient that she should follow-up with her primary care provider in 1 to 2  days; that she should get a new blood pressure monitor and keep a log to take to her PCP.  Strict instructions given to the patient to call 911 for emergency room evaluation if she develops focal weakness, slurred speech, facial asymmetry, dizziness, chest pain, shortness of breath, nausea, vomiting, diaphoresis, edema, other concerning symptoms.   Final Clinical Impressions(s) / UC Diagnoses   Final diagnoses:  None   Discharge Instructions   None    ED Prescriptions    None     Controlled Substance Prescriptions [place] Controlled Substance Registry consulted? Not Applicable   Mickie Bailate, [place] H, NP 05/27/19 [phone]

## 2019-05-27 NOTE — ED Triage Notes (Signed)
Pt states her B/P has been up and down for 3 days or more. Pt has had some tingling in her lip.

## 2019-05-27 NOTE — Discharge Instructions (Addendum)
Your blood pressure today is elevated at 178/97 and 170/82.  Follow-up with your primary care provider in 1 to 2 days.  Get a new blood pressure machine for home.  Take your blood pressure and keep a log to take to your primary care provider.  Follow a low-sodium diet.    You should be seen in the emergency department if you develop dizziness, weakness, droopiness in your face, slurred speech, chest pain, shortness of breath, nausea, vomiting, sweating, swelling, or other concerning symptoms.

## 2019-05-28 ENCOUNTER — Telehealth: Payer: Self-pay | Admitting: Physician Assistant

## 2019-05-28 NOTE — Telephone Encounter (Signed)
Patient does not want to come into the office and would like to just be started on BP medication.

## 2019-05-28 NOTE — Telephone Encounter (Signed)
May I schedule patient with you tomorrow if willing

## 2019-05-28 NOTE — Telephone Encounter (Signed)
I would if it was a blood pressure but the problem is with the blood pressure she needs to have her blood pressure checked which needs to be an in person visit, we may have to do this with on call provider.  I am virtual and working from home tomorrow

## 2019-05-29 NOTE — Telephone Encounter (Signed)
Patient has upcoming appt on 7/14. Changed visit to televisit

## 2019-05-29 NOTE — Telephone Encounter (Signed)
Can she have a televist and have a BP cuff with readings?  And depending on which medication, she will need labs within a month of starting.

## 2019-06-02 ENCOUNTER — Ambulatory Visit (INDEPENDENT_AMBULATORY_CARE_PROVIDER_SITE_OTHER): Payer: PPO | Admitting: Physician Assistant

## 2019-06-02 ENCOUNTER — Encounter: Payer: Self-pay | Admitting: Physician Assistant

## 2019-06-02 ENCOUNTER — Other Ambulatory Visit: Payer: Self-pay

## 2019-06-02 DIAGNOSIS — I10 Essential (primary) hypertension: Secondary | ICD-10-CM | POA: Diagnosis not present

## 2019-06-02 MED ORDER — AMLODIPINE BESYLATE 5 MG PO TABS: 5.0000 mg | ORAL_TABLET | Freq: Every day | ORAL | 3 refills | Status: DC

## 2019-06-02 NOTE — Progress Notes (Signed)
Telephone visit  Subjective: CC: Elevated blood pressure PCP: Remus LofflerJones, Khrista Braun S, PA-C ZOX:WRUEAVHPI:Allison Evonnie DawesS Bell is a 68 y.o. female calls for telephone consult today. Patient provides verbal consent for consult held via phone.  Patient is identified with 2 separate identifiers.  At this time the entire area is on COVID-19 social distancing and stay home orders are in place.  Patient is of higher risk and therefore we are performing this by a virtual method.  Location of patient: Home Location of provider: [facility] Others present for call: None  This patient is having a follow-up from elevated blood pressure readings.  She was seen at the urgent care a week or 2 ago.  With quite an elevation of her blood pressure 178/97 and 170/82.  She was encouraged to have follow-up with her provider.  She continued to have extremely high readings on her home machine, which was actually rather old.  So she did get a new machine and the readings are still elevated but a little bit lower.  It is the second anniversary of her husband's death yesterday.  So she is under a lot of stress at this time.  Her readings are as follows 174/91 156/82 178/98 155/83 160/81 184/93 174/87 179/92 today  She takes a very minimal medication and tries to be as natural as possible.  She is eating and drinking well.  I explained to her that people can develop essential hypertension it does not look bad upon her to have the elevated blood pressure with what is going on.  I do think that when she gets anxious the readings are little bit higher.  I explained that even if she came down 20 points on all of those readings she still would be fairly elevated.     We discussed starting amlodipine 5 mg 1 daily   ROS: Per HPI  Allergies  Allergen Reactions  . Epinephrine     REACTION: heart pounds and races  . Penicillins     REACTION: swelling and rash  . Procaine   . Vancomycin     REACTION: head throbbing,heart raced, wave of  heat throughout body   Past Medical History:  Diagnosis Date  . Hypertension   . IBS (irritable bowel syndrome)   . Thyroid disease     Current Outpatient Medications:  .  amLODipine (NORVASC) 5 MG tablet, Take 1 tablet (5 mg total) by mouth daily., Disp: 90 tablet, Rfl: 3 .  Cholecalciferol (VITAMIN D-3 PO), Take by mouth., Disp: , Rfl:  .  Magnesium Ascorbate POWD, by Does not apply route., Disp: , Rfl:  .  NON FORMULARY, Thyroid script 1 capsule daily., Disp: , Rfl:  .  TURMERIC PO, Take by mouth., Disp: , Rfl:   Assessment/ Plan: 68 y.o. female   1. Essential hypertension - amLODipine (NORVASC) 5 MG tablet; Take 1 tablet (5 mg total) by mouth daily.  Dispense: 90 tablet; Refill: 3 We discussed the pros and cons of this medication and the side effects.  She will pick it up at the pharmacy and speak to the pharmacist if she has any questions.  She is to call us if she has any side effects that are intolerable.  Recheck 4 weeks, may do tele-visit if needed if necessary  Continue all other maintenance medications as listed above.  Start time: 10:04 AM End time: 10:22 AM  Meds ordered this encounter  Medications  . amLODipine (NORVASC) 5 MG tablet    Sig: Take  1 tablet (5 mg total) by mouth daily.    Dispense:  90 tablet    Refill:  3    Order Specific Question:   Supervising Provider    Answer:   Janora Norlander [[id number]]    Allison Nearing PA-C [place] [phone]

## 2019-06-08 ENCOUNTER — Other Ambulatory Visit: Payer: Self-pay | Admitting: Physician Assistant

## 2019-06-08 ENCOUNTER — Telehealth: Payer: Self-pay | Admitting: Physician Assistant

## 2019-06-08 DIAGNOSIS — I10 Essential (primary) hypertension: Secondary | ICD-10-CM

## 2019-06-08 MED ORDER — LISINOPRIL 10 MG PO TABS: 10.0000 mg | ORAL_TABLET | Freq: Every day | ORAL | 1 refills | Status: DC

## 2019-06-08 NOTE — Telephone Encounter (Signed)
Patient has been on amlodipine since 06/02/19- states it has helped bring down her bp some but she has been having side effects.  States she has been having on and off dizzy spells with a flushed face.  States she feels dehydrated even though she is drinking a lot of water.  She has had two episodes of tingling in her lips but states this also happened before she was placed on medication. Please advise and send back to pools.

## 2019-06-08 NOTE — Telephone Encounter (Signed)
Orders are placed, med sent to [facility]

## 2019-06-08 NOTE — Telephone Encounter (Signed)
Patient is currently on amlodipine 5 mg 1 daily.  If it is bothering her she needs to stop it.   We will start lisinopril 10 mg 1 daily.   She needs to come back in to the office and have labs performed in 7 to 14 days to check renal function back so of that medication.  Any of the ACE and ARB medications which are the most commonly used needs to have this lab performed.

## 2019-06-08 NOTE — Telephone Encounter (Signed)
Patient aware and verbalizes understanding. 

## 2019-06-10 ENCOUNTER — Other Ambulatory Visit: Payer: Self-pay | Admitting: *Deleted

## 2019-06-10 MED ORDER — LISINOPRIL 10 MG PO TABS: 10.0000 mg | ORAL_TABLET | Freq: Every day | ORAL | 1 refills | Status: DC

## 2019-06-11 ENCOUNTER — Ambulatory Visit: Payer: PPO | Admitting: Neurology

## 2019-06-11 ENCOUNTER — Other Ambulatory Visit: Payer: Self-pay

## 2019-06-11 ENCOUNTER — Encounter: Payer: Self-pay | Admitting: Neurology

## 2019-06-11 VITALS — BP 161/72 | HR 74 | Temp 98.0°F | Ht 62.0 in | Wt 212.0 lb

## 2019-06-11 DIAGNOSIS — M791 Myalgia, unspecified site: Secondary | ICD-10-CM
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
## 2019-06-12 LAB — ANA W/REFLEX: Anti Nuclear Antibody (ANA): NEGATIVE

## 2019-06-12 LAB — SEDIMENTATION RATE: Sed Rate: 12 mm/hr (ref 0–40)

## 2019-06-12 LAB — B. BURGDORFI ANTIBODIES: Lyme IgG/IgM Ab: <0.91 {ISR} (ref 0.00–0.90)

## 2019-06-12 LAB — RHEUMATOID FACTOR: Rheumatoid fact SerPl-aCnc: <10 IU/mL (ref 0.0–13.9)

## 2019-06-30 ENCOUNTER — Ambulatory Visit: Payer: PPO | Admitting: Neurology

## 2019-07-02 ENCOUNTER — Other Ambulatory Visit: Payer: Self-pay | Admitting: Physician Assistant

## 2020-01-09 DIAGNOSIS — Z20828 Contact with and (suspected) exposure to other viral communicable diseases: Secondary | ICD-10-CM | POA: Diagnosis not present

## 2020-01-09 DIAGNOSIS — Z20822 Contact with and (suspected) exposure to covid-19: Secondary | ICD-10-CM | POA: Diagnosis not present

## 2020-01-11 ENCOUNTER — Other Ambulatory Visit: Payer: Self-pay | Admitting: Nurse Practitioner

## 2020-01-20 ENCOUNTER — Ambulatory Visit: Payer: PPO | Attending: Internal Medicine

## 2020-01-20 DIAGNOSIS — Z20822 Contact with and (suspected) exposure to covid-19: Secondary | ICD-10-CM

## 2020-01-21 LAB — NOVEL CORONAVIRUS, NAA: SARS-CoV-2, NAA: NOT DETECTED

## 2020-01-27 ENCOUNTER — Telehealth: Payer: Self-pay | Admitting: Physician Assistant

## 2020-01-27 NOTE — Chronic Care Management (AMB) (Signed)
  Chronic Care Management   Outreach Note  01/27/2020 Name: DANIYA ARAMBURO MRN: [id number] DOB: [birth year]  ADALENA ABDULLA is a 69 y.o. year old female who is a primary care patient of Remus Loffler, PA-C. I reached out to Nanetta Batty by phone today in response to a referral sent by Ms. Cordella Register [place]'s health plan.     An unsuccessful telephone outreach was attempted today. The patient was referred to the case management team for assistance with care management and care coordination.   Follow Up Plan: A HIPPA compliant phone message was left for the patient providing contact information and requesting a return call. The care management team will reach out to the patient again over the next 7 days.  If patient returns call to provider office, please advise to call Embedded Care Management Care Guide Gwenevere Ghazi at [phone].  Gwenevere Ghazi  Care Guide, Embedded Care Coordination [facility]  [place], [place] [address] Direct Dial: [phone] [email or web address]: [email or web address]

## 2020-02-03 NOTE — Chronic Care Management (AMB) (Signed)
  Chronic Care Management   PCP Scheduling Note  02/03/2020 Name: Allison Bell MRN: [id number] DOB: [birth year]  Primary Care Provider: Remus Loffler, PA-C  Allison Bell is a 69 y.o. year old female who is a primary care patient of Remus Loffler, PA-C. Please note that Allison Bell has not had a PCP visit in the last 12 months and is in need of care management services. The patient needs to be contacted for PCP visit scheduling and has been referred to the appropriate scheduling resource for the practice.   Follow Up Plan: The care coordination team will engage the patient to assist with care management and/or care coordination needs after the PCP visit has been scheduled.   Patient needs one year PCP appointment scheduling out RN CM past July.  Gwenevere Ghazi  Care Guide, Embedded Care Coordination [facility]  [place], [place] [address] Direct Dial: [phone] [email or web address] Website: [email or web address]

## 2020-03-08 ENCOUNTER — Telehealth: Payer: Self-pay | Admitting: Family Medicine

## 2020-03-08 NOTE — Chronic Care Management (AMB) (Signed)
  Chronic Care Management   Outreach Note  03/08/2020 Name: Allison Bell MRN: [id number] DOB: 1951/01/30  Allison Bell is a 69 y.o. year old female who is a primary care patient of Remus Loffler, PA-C. I reached out to Allison Bell by phone today in response to a referral sent by Allison Bell health plan.     An unsuccessful telephone outreach was attempted today. The patient was referred to the case management team for assistance with care management and care coordination.   Follow Up Plan: A HIPPA compliant phone message was left for the patient providing contact information and requesting a return call. The care management team will reach out to the patient again over the next 7 days. If patient returns call to provider office, please advise to call Embedded Care Management Care Guide Gwenevere Ghazi at [phone].  Gwenevere Ghazi  Care Guide, Embedded Care Coordination [facility]  [place], [place] [address] Direct Dial: [phone] [email or web address] Website: [email or web address]

## 2020-03-15 NOTE — Chronic Care Management (AMB) (Signed)
  Chronic Care Management   Outreach Note  03/15/2020 Name: CERRA EISENHOWER MRN: [id number] DOB: 12-22-1950  LARAE CAISON is a 68 y.o. year old female who is a primary care patient of Remus Loffler, PA-C. I reached out to Nanetta Batty by phone today in response to a referral sent by Ms. Cordella Register [place]'s health plan.     A second unsuccessful telephone outreach was attempted today. The patient was referred to the case management team for assistance with care management and care coordination.   Follow Up Plan: A HIPPA compliant phone message was left for the patient providing contact information and requesting a return call. The care management team will reach out to the patient again over the next 7 days. If patient returns call to provider office, please advise to call Embedded Care Management Care Guide Gwenevere Ghazi at [phone].  Gwenevere Ghazi  Care Guide, Embedded Care Coordination [facility]  [place], [place] [address] Direct Dial: [phone] [email or web address] Website: [email or web address]

## 2020-07-29 ENCOUNTER — Ambulatory Visit: Payer: PPO | Admitting: Family Medicine

## 2020-08-11 ENCOUNTER — Encounter: Payer: Self-pay | Admitting: Family Medicine

## 2020-08-11 ENCOUNTER — Ambulatory Visit (INDEPENDENT_AMBULATORY_CARE_PROVIDER_SITE_OTHER): Payer: PPO | Admitting: Family Medicine

## 2020-08-11 ENCOUNTER — Other Ambulatory Visit: Payer: Self-pay

## 2020-08-11 VITALS — BP 196/84 | HR 107 | Temp 97.5°F | Ht 62.0 in | Wt 209.2 lb

## 2020-08-11 DIAGNOSIS — E782 Mixed hyperlipidemia: Secondary | ICD-10-CM

## 2020-08-11 DIAGNOSIS — I10 Essential (primary) hypertension: Secondary | ICD-10-CM

## 2020-08-11 DIAGNOSIS — E038 Other specified hypothyroidism: Secondary | ICD-10-CM

## 2020-08-11 DIAGNOSIS — E8881 Metabolic syndrome: Secondary | ICD-10-CM

## 2020-08-11 DIAGNOSIS — R5383 Other fatigue: Secondary | ICD-10-CM

## 2020-08-11 MED ORDER — LISINOPRIL 10 MG PO TABS: 10.0000 mg | ORAL_TABLET | Freq: Every day | ORAL | 0 refills | Status: DC

## 2020-08-11 NOTE — Progress Notes (Signed)
Patient: Allison Bell MRN: [id number] DOB: 18-Jan-1951 PCP: Orland Mustard, MD     Subjective:  Chief Complaint  Patient presents with  . Establish Care  . Hypertension  . Hypothyroidism  . Hyperlipidemia    HPI: The patient is a 69 y.o. female who presents today establish care. She has a past medical history of htn (untreated), hypothyroidism, hyperlipidemia, metabolic syndrome and chronic back pain.   Hypertension: Here for follow up of hypertension.  Currently on no medication. She has tried lisinopril and hctz and states she didn't feel well.  Does not takes medication as prescribed and denies any side effects. Exercise includes none . Weight has been stable. Denies any chest pain, headaches, shortness of breath, vision changes, swelling in lower extremities.   Hypothyroidism She is on no medication.   Hyperlipidemia She is on no medication. Has an aversion to medication. She knows she needs to lose weight and exercise.   Fatigue She feels like so much has happened including the death of her husband 3 years ago and has had to move and has had one thing after the other and is just tired. She also had covid in February in 2021. She also feels dehydrated and was told this could be from covid.   Review of Systems  Constitutional: Positive for fatigue. Negative for chills and fever.  Cardiovascular: Negative for chest pain and palpitations.  Gastrointestinal: Negative for abdominal pain and nausea.  Genitourinary: Negative for frequency and urgency.  Neurological: Negative for dizziness and headaches.    Allergies Patient is allergic to epinephrine, penicillins, procaine, and vancomycin.  Past Medical History Patient  has a past medical history of Hypertension, IBS (irritable bowel syndrome), and Thyroid disease.  Surgical History Patient  has a past surgical history that includes Abdominal hysterectomy; Ovarian cyst removal; and Cesarean section.  Family  History Pateint's family history is not on file.  Social History Patient  reports that she has never smoked. She has never used smokeless tobacco. She reports that she does not drink alcohol and does not use drugs.    Objective: Vitals:   08/11/20 0832 08/11/20 0918  BP: (!) 186/91 (!) 196/84  Pulse: (!) 107   Temp: (!) 97.5 F (36.4 C)   TempSrc: Temporal   SpO2: 99%   Weight: 209 lb 3.2 oz (94.9 kg)   Height: 5\' 2"  (1.575 m)     Body mass index is 38.26 kg/m.  Physical Exam Vitals reviewed.  Constitutional:      Appearance: Normal appearance. She is well-developed. She is obese.  HENT:     Head: Normocephalic and atraumatic.     Right Ear: Tympanic membrane, ear canal and external ear normal.     Left Ear: Tympanic membrane, ear canal and external ear normal.  Eyes:     Conjunctiva/sclera: Conjunctivae normal.     Pupils: Pupils are equal, round, and reactive to light.  Neck:     Thyroid: No thyromegaly.  Cardiovascular:     Rate and Rhythm: Normal rate and regular rhythm.     Pulses: Normal pulses.     Heart sounds: Murmur heard.   Pulmonary:     Effort: Pulmonary effort is normal.     Breath sounds: Normal breath sounds.  Abdominal:     General: Bowel sounds are normal. There is no distension.     Palpations: Abdomen is soft.     Tenderness: There is no abdominal tenderness.  Musculoskeletal:     Cervical back: Normal  range of motion and neck supple.  Lymphadenopathy:     Cervical: No cervical adenopathy.  Skin:    General: Skin is warm and dry.     Findings: No rash.  Neurological:     General: No focal deficit present.     Mental Status: She is alert and oriented to person, place, and time.     Cranial Nerves: No cranial nerve deficit.     Coordination: Coordination normal.     Deep Tendon Reflexes: Reflexes normal.  Psychiatric:        Mood and Affect: Mood normal.        Behavior: Behavior normal.      Office Visit from 08/11/2020 in [place]  [facility]  PHQ-2 Total Score 0         Assessment/plan: 1. Essential hypertension Extremely elevated and discussed risks of uncontrolled and untreated HTN. She is really wanting to do natural things, but discussed this is too high and puts her at risk of stroke, heart attack, CHf/afib etc. She is more on board to start medication and we will go slow with titration of drug. Discussed options and we are going to do lisinopril at 10mg /day. I think we will have to increase this, but I want her to be comfortable with this.  Side effects of ACE-I discussed including dry cough and angioedema. She is to call me if feels like they have a dry cough and they are to call 911 or go to ER if any signs/symptoms of angioedema. Discussed low salt diet, exercise and keeping home log. F/u in 1 month for recheck.   - CBC with Differential/Platelet; Future - COMPLETE METABOLIC PANEL WITH GFR; Future - Microalbumin / creatinine urine ratio; Future  2. Other specified hypothyroidism  - TSH; Future - T4, free; Future  3. Mixed hyperlipidemia  - Lipid panel; Future  4. Metabolic syndrome  - Hemoglobin A1c; Future  5. Other fatigue  - VITAMIN D 25 Hydroxy (Vit-D Deficiency, Fractures); Future    This visit occurred during the SARS-CoV-2 public health emergency.  Safety protocols were in place, including screening questions prior to the visit, additional usage of staff PPE, and extensive cleaning of exam room while observing appropriate contact time as indicated for disinfecting solutions.     Return in about 1 month (around 09/10/2020) for blood pressure .   09/12/2020, MD [place] [facility]   08/11/2020

## 2020-08-11 NOTE — Patient Instructions (Signed)
Blood pressure is very, very high. We are going to start lisinopril back at 10mg . I think we will need to go up on this dosage, but we will be very slow with this. Takes 2 weeks to get into your system.   Side effects of ACE-I discussed including dry cough and angioedema. She is to call me if feels like they have a dry cough and they are to call 911 or go to ER if any signs/symptoms of angioedema.   Polysporin or clotrimazole cream try these of cracks on sides of mouth.   Come back in one month with home log with blood pressure.

## 2020-08-12 LAB — CBC WITH DIFFERENTIAL/PLATELET
Absolute Monocytes: 614 cells/uL (ref 200–950)
Basophils Absolute: 80 cells/uL (ref 0–200)
Basophils Relative: 0.9 %
Eosinophils Absolute: 142 cells/uL (ref 15–500)
Eosinophils Relative: 1.6 %
HCT: 46.5 % — ABNORMAL HIGH (ref 35.0–45.0)
Hemoglobin: 15.1 g/dL (ref 11.7–15.5)
Lymphs Abs: 1798 cells/uL (ref 850–3900)
MCH: 27.8 pg (ref 27.0–33.0)
MCHC: 32.5 g/dL (ref 32.0–36.0)
MCV: 85.6 fL (ref 80.0–100.0)
MPV: 10.4 fL (ref 7.5–12.5)
Monocytes Relative: 6.9 %
Neutro Abs: 6266 cells/uL (ref 1500–7800)
Neutrophils Relative %: 70.4 %
Platelets: 267 10*3/uL (ref 140–400)
RBC: 5.43 10*6/uL — ABNORMAL HIGH (ref 3.80–5.10)
RDW: 12.4 % (ref 11.0–15.0)
Total Lymphocyte: 20.2 %
WBC: 8.9 10*3/uL (ref 3.8–10.8)

## 2020-08-12 LAB — COMPLETE METABOLIC PANEL WITH GFR
AG Ratio: 2 (calc) (ref 1.0–2.5)
ALT: 19 U/L (ref 6–29)
AST: 21 U/L (ref 10–35)
Albumin: 4.5 g/dL (ref 3.6–5.1)
Alkaline phosphatase (APISO): 56 U/L (ref 37–153)
BUN: 8 mg/dL (ref 7–25)
CO2: 29 mmol/L (ref 20–32)
Calcium: 9.5 mg/dL (ref 8.6–10.4)
Chloride: 105 mmol/L (ref 98–110)
Creat: 0.69 mg/dL (ref 0.50–0.99)
GFR, Est African American: 103 mL/min/{1.73_m2} (ref >=60)
GFR, Est Non African American: 89 mL/min/{1.73_m2} (ref >=60)
Globulin: 2.2 g/dL (calc) (ref 1.9–3.7)
Glucose, Bld: 150 mg/dL — ABNORMAL HIGH (ref 65–99)
Potassium: 4.2 mmol/L (ref 3.5–5.3)
Sodium: 142 mmol/L (ref 135–146)
Total Bilirubin: 0.5 mg/dL (ref 0.2–1.2)
Total Protein: 6.7 g/dL (ref 6.1–8.1)

## 2020-08-12 LAB — LIPID PANEL
Cholesterol: 207 mg/dL — ABNORMAL HIGH (ref <200)
HDL: 61 mg/dL (ref >=50)
LDL Cholesterol (Calc): 113 mg/dL (calc) — ABNORMAL HIGH
Non-HDL Cholesterol (Calc): 146 mg/dL (calc) — ABNORMAL HIGH (ref <130)
Total CHOL/HDL Ratio: 3.4 (calc) (ref <5.0)
Triglycerides: 221 mg/dL — ABNORMAL HIGH (ref <150)

## 2020-08-12 LAB — HEMOGLOBIN A1C
Hgb A1c MFr Bld: 5.9 % of total Hgb — ABNORMAL HIGH (ref <5.7)
Mean Plasma Glucose: 123 (calc)
eAG (mmol/L): 6.8 (calc)

## 2020-08-12 LAB — T4, FREE: Free T4: 1.1 ng/dL (ref 0.8–1.8)

## 2020-08-12 LAB — MICROALBUMIN / CREATININE URINE RATIO
Creatinine, Urine: 12 mg/dL — ABNORMAL LOW (ref 20–275)
Microalb Creat Ratio: 17 mcg/mg creat (ref <30)
Microalb, Ur: 0.2 mg/dL

## 2020-08-12 LAB — TSH: TSH: 4 mIU/L (ref 0.40–4.50)

## 2020-08-12 LAB — VITAMIN D 25 HYDROXY (VIT D DEFICIENCY, FRACTURES): Vit D, 25-Hydroxy: 60 ng/mL (ref 30–100)

## 2020-08-14 ENCOUNTER — Encounter: Payer: Self-pay | Admitting: Family Medicine

## 2020-08-14 DIAGNOSIS — R7303 Prediabetes: Secondary | ICD-10-CM | POA: Insufficient documentation

## 2020-09-12 ENCOUNTER — Ambulatory Visit: Payer: PPO | Admitting: Family Medicine

## 2020-11-27 DIAGNOSIS — M25561 Pain in right knee: Secondary | ICD-10-CM | POA: Diagnosis not present

## 2020-11-28 ENCOUNTER — Telehealth: Payer: Self-pay | Admitting: Family Medicine

## 2020-11-28 DIAGNOSIS — M25561 Pain in right knee: Secondary | ICD-10-CM | POA: Diagnosis not present

## 2020-11-28 DIAGNOSIS — M2391 Unspecified internal derangement of right knee: Secondary | ICD-10-CM | POA: Diagnosis not present

## 2020-11-28 NOTE — Telephone Encounter (Signed)
Left message for patient to call back and schedule Medicare Annual Wellness Visit (AWV) either virtually OR in office.   No hx; please schedule at anytime with [facility]-Nurse Health Advisor at [facility].  This should be a 45 minute visit.  AWV-I PER [place] AS OF 09/19/16

## 2020-12-12 DIAGNOSIS — M23300 Other meniscus derangements, unspecified lateral meniscus, right knee: Secondary | ICD-10-CM | POA: Diagnosis not present

## 2020-12-12 DIAGNOSIS — M25561 Pain in right knee: Secondary | ICD-10-CM | POA: Diagnosis not present

## 2021-01-13 DIAGNOSIS — M25561 Pain in right knee: Secondary | ICD-10-CM | POA: Diagnosis not present

## 2021-01-18 DIAGNOSIS — M25561 Pain in right knee: Secondary | ICD-10-CM | POA: Diagnosis not present

## 2021-01-24 DIAGNOSIS — M25561 Pain in right knee: Secondary | ICD-10-CM | POA: Diagnosis not present

## 2021-02-02 DIAGNOSIS — M25561 Pain in right knee: Secondary | ICD-10-CM | POA: Diagnosis not present

## 2021-02-11 DIAGNOSIS — R059 Cough, unspecified: Secondary | ICD-10-CM | POA: Diagnosis not present

## 2021-02-11 DIAGNOSIS — J029 Acute pharyngitis, unspecified: Secondary | ICD-10-CM | POA: Diagnosis not present

## 2021-02-11 DIAGNOSIS — J069 Acute upper respiratory infection, unspecified: Secondary | ICD-10-CM | POA: Diagnosis not present

## 2021-02-27 DIAGNOSIS — J3489 Other specified disorders of nose and nasal sinuses: Secondary | ICD-10-CM | POA: Diagnosis not present

## 2021-02-27 DIAGNOSIS — H6122 Impacted cerumen, left ear: Secondary | ICD-10-CM | POA: Diagnosis not present

## 2021-02-27 DIAGNOSIS — Z8616 Personal history of COVID-19: Secondary | ICD-10-CM | POA: Diagnosis not present

## 2021-02-27 DIAGNOSIS — M2669 Other specified disorders of temporomandibular joint: Secondary | ICD-10-CM | POA: Diagnosis not present

## 2021-02-27 DIAGNOSIS — H9202 Otalgia, left ear: Secondary | ICD-10-CM | POA: Diagnosis not present

## 2021-03-10 ENCOUNTER — Telehealth: Payer: Self-pay

## 2021-03-10 DIAGNOSIS — G4719 Other hypersomnia: Secondary | ICD-10-CM | POA: Diagnosis not present

## 2021-03-10 DIAGNOSIS — E669 Obesity, unspecified: Secondary | ICD-10-CM | POA: Diagnosis not present

## 2021-03-10 NOTE — Telephone Encounter (Signed)
Nurse Assessment Nurse: Kizzie Bane, RN, [name] Date/Time Allison Bell Time): 03/10/2021 3:47:47 PM Confirm and document reason for call. If symptomatic, describe symptoms. ---Caller states she has a headache and her blood pressure was 180/80 earlier when she was in the doctor's office Does the patient have any new or worsening symptoms? ---Yes Will a triage be completed? ---Yes Related visit to physician within the last 2 weeks? ---No Does the PT have any chronic conditions? (i.e. diabetes, asthma, this includes High risk factors for pregnancy, etc.) ---Yes List chronic conditions. ---hypertension, hypothyroid Is this a behavioral health or substance abuse call? ---No Guidelines Guideline Title Affirmed Question Affirmed Notes Nurse Date/Time (Eastern Time) Blood Pressure - High Systolic BP >= 160 OR Diastolic >= 100 [name] 03/10/2021 3:50:23 PM Disp. Time Allison Bell Time) Disposition Final User 03/10/2021 3:33:37 PM Attempt made - message left [name] 03/10/2021 3:57:16 PM SEE PCP WITHIN 3 DAYS Yes Kizzie Bane, RN, [name] PLEASE NOTE: All timestamps contained within this report are represented as [place] Standard Time. CONFIDENTIALTY NOTICE: This fax transmission is intended only for the addressee. It contains information that is legally privileged, confidential or otherwise protected from use or disclosure. If you are not the intended recipient, you are strictly prohibited from reviewing, disclosing, copying using or disseminating any of this information or taking any action in reliance on or regarding this information. If you have received this fax in error, please notify us immediately by telephone so that we can arrange for its return to [place]. Phone: [phone], Toll-Free: [phone], Fax: [phone] Page: 2 of 2 Call Id: [id number] Caller Disagree/Comply Comply Caller Understands Yes PreDisposition Did not know what to do Care Advice Given Per Guideline SEE PCP  WITHIN 3 DAYS: * You need to be seen within 2 or 3 days. CALL BACK IF: * Weakness or numbness of the face, arm or leg on one side of the body occurs * Difficulty walking, difficulty talking, or severe headache occurs * Chest pain or difficulty breathing occurs * Your blood pressure is over 180/110 * You become worse CARE ADVICE given per High Blood Pressure (Adult) guideline. Referrals REFERRED TO PCP OFFIC

## 2021-03-21 DIAGNOSIS — E669 Obesity, unspecified: Secondary | ICD-10-CM | POA: Diagnosis not present

## 2021-03-21 DIAGNOSIS — G4733 Obstructive sleep apnea (adult) (pediatric): Secondary | ICD-10-CM | POA: Diagnosis not present

## 2021-03-21 DIAGNOSIS — I10 Essential (primary) hypertension: Secondary | ICD-10-CM | POA: Diagnosis not present

## 2021-03-23 DIAGNOSIS — G4733 Obstructive sleep apnea (adult) (pediatric): Secondary | ICD-10-CM | POA: Diagnosis not present

## 2021-06-01 ENCOUNTER — Telehealth: Payer: Self-pay

## 2021-06-01 NOTE — Telephone Encounter (Signed)
LVM that Dr Artis Flock has left to call back to be scheduled with other provider.

## 2021-11-05 ENCOUNTER — Ambulatory Visit: Admit: 2021-11-05 | Discharge status: Absent without leave | Payer: PPO

## 2021-11-06 ENCOUNTER — Ambulatory Visit: Admission: EM | Admit: 2021-11-06 | Discharge status: Against Medical Advice | Payer: PPO

## 2021-12-21 DIAGNOSIS — M542 Cervicalgia: Secondary | ICD-10-CM | POA: Diagnosis not present

## 2021-12-21 DIAGNOSIS — M545 Low back pain, unspecified: Secondary | ICD-10-CM | POA: Diagnosis not present

## 2021-12-25 DIAGNOSIS — M9903 Segmental and somatic dysfunction of lumbar region: Secondary | ICD-10-CM | POA: Diagnosis not present

## 2021-12-26 DIAGNOSIS — M545 Low back pain, unspecified: Secondary | ICD-10-CM | POA: Diagnosis not present

## 2021-12-26 DIAGNOSIS — M542 Cervicalgia: Secondary | ICD-10-CM | POA: Diagnosis not present

## 2021-12-28 DIAGNOSIS — M545 Low back pain, unspecified: Secondary | ICD-10-CM | POA: Diagnosis not present

## 2021-12-28 DIAGNOSIS — M542 Cervicalgia: Secondary | ICD-10-CM | POA: Diagnosis not present

## 2022-01-02 DIAGNOSIS — M545 Low back pain, unspecified: Secondary | ICD-10-CM | POA: Diagnosis not present

## 2022-01-02 DIAGNOSIS — M542 Cervicalgia: Secondary | ICD-10-CM | POA: Diagnosis not present

## 2022-01-03 DIAGNOSIS — M9903 Segmental and somatic dysfunction of lumbar region: Secondary | ICD-10-CM | POA: Diagnosis not present

## 2022-01-04 DIAGNOSIS — M545 Low back pain, unspecified: Secondary | ICD-10-CM | POA: Diagnosis not present

## 2022-01-04 DIAGNOSIS — M542 Cervicalgia: Secondary | ICD-10-CM | POA: Diagnosis not present

## 2022-01-08 DIAGNOSIS — M545 Low back pain, unspecified: Secondary | ICD-10-CM | POA: Diagnosis not present

## 2022-01-08 DIAGNOSIS — M542 Cervicalgia: Secondary | ICD-10-CM | POA: Diagnosis not present

## 2022-01-09 DIAGNOSIS — M9903 Segmental and somatic dysfunction of lumbar region: Secondary | ICD-10-CM | POA: Diagnosis not present

## 2022-01-10 DIAGNOSIS — M545 Low back pain, unspecified: Secondary | ICD-10-CM | POA: Diagnosis not present

## 2022-01-10 DIAGNOSIS — M542 Cervicalgia: Secondary | ICD-10-CM | POA: Diagnosis not present

## 2022-01-15 DIAGNOSIS — M545 Low back pain, unspecified: Secondary | ICD-10-CM | POA: Diagnosis not present

## 2022-01-15 DIAGNOSIS — M542 Cervicalgia: Secondary | ICD-10-CM | POA: Diagnosis not present

## 2022-01-16 DIAGNOSIS — M9903 Segmental and somatic dysfunction of lumbar region: Secondary | ICD-10-CM | POA: Diagnosis not present

## 2022-01-18 DIAGNOSIS — M545 Low back pain, unspecified: Secondary | ICD-10-CM | POA: Diagnosis not present

## 2022-01-18 DIAGNOSIS — M542 Cervicalgia: Secondary | ICD-10-CM | POA: Diagnosis not present

## 2022-01-23 DIAGNOSIS — M545 Low back pain, unspecified: Secondary | ICD-10-CM | POA: Diagnosis not present

## 2022-01-23 DIAGNOSIS — M542 Cervicalgia: Secondary | ICD-10-CM | POA: Diagnosis not present

## 2022-01-30 DIAGNOSIS — M545 Low back pain, unspecified: Secondary | ICD-10-CM | POA: Diagnosis not present

## 2022-01-30 DIAGNOSIS — M542 Cervicalgia: Secondary | ICD-10-CM | POA: Diagnosis not present

## 2022-02-01 DIAGNOSIS — M9903 Segmental and somatic dysfunction of lumbar region: Secondary | ICD-10-CM | POA: Diagnosis not present

## 2022-02-06 DIAGNOSIS — M542 Cervicalgia: Secondary | ICD-10-CM | POA: Diagnosis not present

## 2022-02-06 DIAGNOSIS — M545 Low back pain, unspecified: Secondary | ICD-10-CM | POA: Diagnosis not present
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
## 2022-02-13 ENCOUNTER — Ambulatory Visit: Payer: Medicare Other | Admitting: Sports Medicine

## 2022-02-13 ENCOUNTER — Other Ambulatory Visit: Payer: Self-pay

## 2022-02-13 DIAGNOSIS — M546 Pain in thoracic spine: Secondary | ICD-10-CM | POA: Diagnosis not present

## 2022-02-13 DIAGNOSIS — M542 Cervicalgia: Secondary | ICD-10-CM

## 2022-02-13 DIAGNOSIS — M545 Low back pain, unspecified: Secondary | ICD-10-CM | POA: Diagnosis not present

## 2022-02-13 DIAGNOSIS — G8929 Other chronic pain: Secondary | ICD-10-CM

## 2022-02-13 MED ORDER — MELOXICAM 15 MG PO TABS: 15.0000 mg | ORAL_TABLET | Freq: Every day | ORAL | 0 refills | Status: DC

## 2022-02-13 NOTE — Patient Instructions (Addendum)
Good to see you  ?- Start meloxicam 15 mg daily x2 weeks.  If still having pain after 2 weeks, complete 3rd-week of meloxicam. May use remaining meloxicam as needed once daily for pain control.  Do not to use additional NSAIDs while taking meloxicam.  May use Tylenol [phone] mg 2 to 3 times a day for breakthrough pain. ?Referral to weight management  ?Recommend elliptical, stationary bike, and water aerobics  ?3 week follow up  ?

## 2022-02-14 DIAGNOSIS — M545 Low back pain, unspecified: Secondary | ICD-10-CM | POA: Diagnosis not present

## 2022-02-14 DIAGNOSIS — M542 Cervicalgia: Secondary | ICD-10-CM | POA: Diagnosis not present

## 2022-02-15 DIAGNOSIS — Z0289 Encounter for other administrative examinations: Secondary | ICD-10-CM

## 2022-02-15 DIAGNOSIS — M9903 Segmental and somatic dysfunction of lumbar region: Secondary | ICD-10-CM | POA: Diagnosis not present

## 2022-02-19 ENCOUNTER — Encounter: Payer: Self-pay | Admitting: Sports Medicine

## 2022-02-20 DIAGNOSIS — M542 Cervicalgia: Secondary | ICD-10-CM | POA: Diagnosis not present

## 2022-02-20 DIAGNOSIS — M545 Low back pain, unspecified: Secondary | ICD-10-CM | POA: Diagnosis not present

## 2022-02-27 ENCOUNTER — Telehealth: Payer: Self-pay | Admitting: Sports Medicine

## 2022-02-27 NOTE — Telephone Encounter (Signed)
Pt was called and advised to stop meloxicam ?

## 2022-02-27 NOTE — Telephone Encounter (Signed)
Pt concerned she is having a reaction to the Meloxicam. Has had evening R ankle swelling and dizziness. Her BP was high but she thinks this was because the symptoms made her anxious. ?

## 2022-03-01 DIAGNOSIS — M9903 Segmental and somatic dysfunction of lumbar region: Secondary | ICD-10-CM | POA: Diagnosis not present

## 2022-03-02 ENCOUNTER — Encounter (INDEPENDENT_AMBULATORY_CARE_PROVIDER_SITE_OTHER): Payer: Self-pay | Admitting: Family Medicine

## 2022-03-02 ENCOUNTER — Ambulatory Visit (INDEPENDENT_AMBULATORY_CARE_PROVIDER_SITE_OTHER): Payer: Medicare Other | Admitting: Family Medicine

## 2022-03-02 VITALS — BP 136/82 | HR 86 | Temp 97.6°F | Ht 62.0 in | Wt 204.0 lb

## 2022-03-02 DIAGNOSIS — E66812 Obesity, class 2: Secondary | ICD-10-CM

## 2022-03-02 DIAGNOSIS — Z6837 Body mass index (BMI) 37.0-37.9, adult: Secondary | ICD-10-CM

## 2022-03-02 DIAGNOSIS — R739 Hyperglycemia, unspecified: Secondary | ICD-10-CM | POA: Diagnosis not present

## 2022-03-02 DIAGNOSIS — Z1331 Encounter for screening for depression: Secondary | ICD-10-CM

## 2022-03-02 DIAGNOSIS — E538 Deficiency of other specified B group vitamins: Secondary | ICD-10-CM | POA: Diagnosis not present

## 2022-03-02 DIAGNOSIS — E559 Vitamin D deficiency, unspecified: Secondary | ICD-10-CM

## 2022-03-02 DIAGNOSIS — R5383 Other fatigue: Secondary | ICD-10-CM | POA: Diagnosis not present

## 2022-03-02 DIAGNOSIS — R0602 Shortness of breath: Secondary | ICD-10-CM

## 2022-03-02 DIAGNOSIS — E668 Other obesity: Secondary | ICD-10-CM

## 2022-03-03 LAB — CMP14+EGFR
ALT: 19 IU/L (ref 0–32)
AST: 24 IU/L (ref 0–40)
Albumin/Globulin Ratio: 2.7 — ABNORMAL HIGH (ref 1.2–2.2)
Albumin: 4.9 g/dL — ABNORMAL HIGH (ref 3.7–4.7)
Alkaline Phosphatase: 57 IU/L (ref 44–121)
BUN/Creatinine Ratio: 13 (ref 12–28)
BUN: 9 mg/dL (ref 8–27)
Bilirubin Total: 0.6 mg/dL (ref 0.0–1.2)
CO2: 21 mmol/L (ref 20–29)
Calcium: 9.6 mg/dL (ref 8.7–10.3)
Chloride: 105 mmol/L (ref 96–106)
Creatinine, Ser: 0.71 mg/dL (ref 0.57–1.00)
Globulin, Total: 1.8 g/dL (ref 1.5–4.5)
Glucose: 107 mg/dL — ABNORMAL HIGH (ref 70–99)
Potassium: 4.5 mmol/L (ref 3.5–5.2)
Sodium: 144 mmol/L (ref 134–144)
Total Protein: 6.7 g/dL (ref 6.0–8.5)
eGFR: 91 mL/min/{1.73_m2} (ref >59)

## 2022-03-03 LAB — T4, FREE: Free T4: 1.27 ng/dL (ref 0.82–1.77)

## 2022-03-03 LAB — CBC WITH DIFFERENTIAL/PLATELET
Basophils Absolute: 0.1 10*3/uL (ref 0.0–0.2)
Basos: 1 %
EOS (ABSOLUTE): 0.2 10*3/uL (ref 0.0–0.4)
Eos: 2 %
Hematocrit: 44.1 % (ref 34.0–46.6)
Hemoglobin: 14.4 g/dL (ref 11.1–15.9)
Immature Grans (Abs): 0 10*3/uL (ref 0.0–0.1)
Immature Granulocytes: 0 %
Lymphocytes Absolute: 1.8 10*3/uL (ref 0.7–3.1)
Lymphs: 19 %
MCH: 27.4 pg (ref 26.6–33.0)
MCHC: 32.7 g/dL (ref 31.5–35.7)
MCV: 84 fL (ref 79–97)
Monocytes Absolute: 0.6 10*3/uL (ref 0.1–0.9)
Monocytes: 7 %
Neutrophils Absolute: 6.6 10*3/uL (ref 1.4–7.0)
Neutrophils: 71 %
Platelets: 323 10*3/uL (ref 150–450)
RBC: 5.25 x10E6/uL (ref 3.77–5.28)
RDW: 11.8 % (ref 11.7–15.4)
WBC: 9.3 10*3/uL (ref 3.4–10.8)

## 2022-03-03 LAB — T3: T3, Total: 144 ng/dL (ref 71–180)

## 2022-03-03 LAB — LIPID PANEL WITH LDL/HDL RATIO
Cholesterol, Total: 200 mg/dL — ABNORMAL HIGH (ref 100–199)
HDL: 53 mg/dL (ref >39)
LDL Chol Calc (NIH): 128 mg/dL — ABNORMAL HIGH (ref 0–99)
LDL/HDL Ratio: 2.4 ratio (ref 0.0–3.2)
Triglycerides: 108 mg/dL (ref 0–149)
VLDL Cholesterol Cal: 19 mg/dL (ref 5–40)

## 2022-03-03 LAB — INSULIN, RANDOM: INSULIN: 20 u[IU]/mL (ref 2.6–24.9)

## 2022-03-03 LAB — HEMOGLOBIN A1C
Est. average glucose Bld gHb Est-mCnc: 111 mg/dL
Hgb A1c MFr Bld: 5.5 % (ref 4.8–5.6)

## 2022-03-03 LAB — VITAMIN B12: Vitamin B-12: 928 pg/mL (ref 232–1245)

## 2022-03-03 LAB — TSH: TSH: 4.54 u[IU]/mL — ABNORMAL HIGH (ref 0.450–4.500)

## 2022-03-03 LAB — VITAMIN D 25 HYDROXY (VIT D DEFICIENCY, FRACTURES): Vit D, 25-Hydroxy: 88.4 ng/mL (ref 30.0–100.0)
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
## 2022-03-06 ENCOUNTER — Ambulatory Visit (INDEPENDENT_AMBULATORY_CARE_PROVIDER_SITE_OTHER): Payer: Medicare Other

## 2022-03-06 ENCOUNTER — Ambulatory Visit: Payer: Medicare Other | Admitting: Sports Medicine

## 2022-03-06 VITALS — BP 120/80 | HR 84 | Ht 62.0 in | Wt 207.0 lb

## 2022-03-06 DIAGNOSIS — G8929 Other chronic pain: Secondary | ICD-10-CM | POA: Diagnosis not present

## 2022-03-06 DIAGNOSIS — M542 Cervicalgia: Secondary | ICD-10-CM | POA: Diagnosis not present

## 2022-03-06 DIAGNOSIS — M545 Low back pain, unspecified: Secondary | ICD-10-CM | POA: Diagnosis not present

## 2022-03-06 DIAGNOSIS — M25551 Pain in right hip: Secondary | ICD-10-CM

## 2022-03-06 NOTE — Patient Instructions (Signed)
Good to see ou ?Continue HEP and physical therapy focusing on right hip and glute  ?Discontinue meloxicam  ?Start tylenol 1000 mg nightly and then can take additional 1-3 tablets during the day for pain  ?Recommend using Voltaren gel over hip and painful areas 1-2 times per day  ?3-4 week follow up  ?

## 2022-03-07 ENCOUNTER — Encounter: Payer: Self-pay | Admitting: Sports Medicine

## 2022-03-08 NOTE — Telephone Encounter (Signed)
Pt was messaged back explaining that it is safe to use Voltaren gel on painful areas 1-2 times a day per Dr. Marisue Brooklyn instructions and that HEP stood for home exercise plan  ?

## 2022-03-13 DIAGNOSIS — M542 Cervicalgia: Secondary | ICD-10-CM | POA: Diagnosis not present

## 2022-03-13 DIAGNOSIS — M545 Low back pain, unspecified: Secondary | ICD-10-CM | POA: Diagnosis not present

## 2022-03-14 NOTE — Progress Notes (Signed)
? ? ? ? ?Chief Complaint:  ? ?OBESITY ?Allison Bell (MR# [id number]) is a 71 y.o. female who presents for evaluation and treatment of obesity and related comorbidities. Current BMI is Body mass index is 37.31 kg/m?Marland Kitchen Allison Bell has been struggling with her weight for many years and has been unsuccessful in either losing weight, maintaining weight loss, or reaching her healthy weight goal. ? ?Allison Bell is trying to eat gluten free. She has no history of celiac disease but had pounding palpitations.  ? ?Allison Bell is currently in the action stage of change and ready to dedicate time achieving and maintaining a healthier weight. Allison Bell is interested in becoming our patient and working on intensive lifestyle modifications including (but not limited to) diet and exercise for weight loss. ? ?Allison Bell's habits were reviewed today and are as follows: Her family eats meals together, she thinks her family will eat healthier with her, her desired weight loss is 54-64 lbs, she has been heavy most of her life, she started gaining weight mostly after her C-sections, her heaviest weight ever was 230 pounds, she has significant food cravings issues, she is frequently drinking liquids with calories, and she struggles with emotional eating. ? ?Depression Screen ?Allison Bell's Food and Mood (modified PHQ-9) score was 12. ? ? ?  03/02/2022  ?  8:35 AM  ?Depression screen PHQ 2/9  ?Decreased Interest 2  ?Down, Depressed, Hopeless 1  ?PHQ - 2 Score 3  ?Altered sleeping 2  ?Tired, decreased energy 2  ?Change in appetite 1  ?Feeling bad or failure about yourself  2  ?Trouble concentrating 1  ?Moving slowly or fidgety/restless 1  ?Suicidal thoughts 0  ?PHQ-9 Score 12  ?Difficult doing work/chores Somewhat difficult  ? ?Subjective:  ? ?1. Other fatigue ?Allison Bell admits to daytime somnolence and admits to waking up still tired. Patient has a history of symptoms of daytime fatigue and morning fatigue. Allison Bell generally gets 7 or 8 hours of sleep per night, and  states that she has nightime awakenings. Snoring is not present. Apneic episodes are not present. Epworth Sleepiness Score is 4.  ? ?2. SOB (shortness of breath) on exertion ?Allison Bell notes increasing shortness of breath with exercising and seems to be worsening over time with weight gain. She notes getting out of breath sooner with activity than she used to. This has not gotten worse recently. Allison Bell denies shortness of breath at rest or orthopnea. ? ?3. Hyperglycemia ?Allison Bell has a history of elevated glucose and A1c. ? ?4. B12 deficiency ?Allison Bell is on OTC Vitamins, and she is working on diet and may be low currently.  ? ?5. Vitamin D deficiency ?Allison Bell is on Vitamin D OTC. Last level was not yet at goal.  ? ?Assessment/Plan:  ? ?1. Other fatigue ?Allison Bell does feel that her weight is causing her energy to be lower than it should be. Fatigue may be related to obesity, depression or many other causes. Labs will be ordered, and in the meanwhile, Allison Bell will focus on self care including making healthy food choices, increasing physical activity and focusing on stress reduction. ? ?- EKG 12-Lead ?- CBC with Differential/Platelet ?- Lipid Panel With LDL/HDL Ratio ?- TSH ?- T4, free ?- T3 ? ?2. SOB (shortness of breath) on exertion ?Allison Bell does feel that she gets out of breath more easily that she used to when she exercises. Allison Bell's shortness of breath appears to be obesity related and exercise induced. She has agreed to work on weight loss and gradually increase exercise to treat  her exercise induced shortness of breath. Will continue to monitor closely. ? ?3. Hyperglycemia ?Cardiovascular risk and specific lipid/LDL goals reviewed.  We discussed several lifestyle modifications today. We will check labs today. Allison Bell will start her meal plan and weight loss efforts. Orders and follow up as documented in patient record.  ? ?- CMP14+EGFR ?- Insulin, random ?- Hemoglobin A1c ? ?4. B12 deficiency ?The diagnosis was reviewed  with the patient. We will check labs today, and Allison Bell will start her meal plan. Orders and follow up as documented in patient record. ? ?- Vitamin B12 ? ?5. Vitamin D deficiency ?We will check labs today. Allison Bell will follow-up for routine testing of Vitamin D, at least 2-3 times per year to avoid over-replacement. ? ?- VITAMIN D 25 Hydroxy (Vit-D Deficiency, Fractures) ? ?6. Depression screen ?Allison Bell had a positive depression screening. Depression is commonly associated with obesity and often results in emotional eating behaviors. We will monitor this closely and work on CBT to help improve the non-hunger eating patterns. Referral to Psychology may be required if no improvement is seen as she continues in our clinic. ? ?7. Class 2 severe obesity with serious comorbidity and body mass index (BMI) of 37.0 to 37.9 in adult, unspecified obesity type ([place]) ?Allison Bell is currently in the action stage of change and her goal is to continue with weight loss efforts. I recommend Allison Bell begin the structured treatment plan as follows: ? ?She has agreed to the Category 2 Plan + 100 calories, but minus the bread. ? ?Exercise goals: No exercise has been prescribed for now, while we concentrate on nutritional changes.  ? ?Behavioral modification strategies: increasing lean protein intake. ? ?She was informed of the importance of frequent follow-up visits to maximize her success with intensive lifestyle modifications for her multiple health conditions. She was informed we would discuss her lab results at her next visit unless there is a critical issue that needs to be addressed sooner. Allison Bell agreed to keep her next visit at the agreed upon time to discuss these results. ? ?Objective:  ? ?Blood pressure 136/82, pulse 86, temperature 97.6 ?F (36.4 ?C), height _0  (1.575 m), weight 204 lb (92.5 kg), SpO2 99 %. Body mass index is 37.31 kg/m?. ? ?EKG: Normal sinus rhythm, rate 74 BPM. ? ?Indirect Calorimeter completed today shows a VO2  of 238 and a REE of 1642.  Her calculated basal metabolic rate is 3009 thus her basal metabolic rate is better than expected. ? ?General: Cooperative, alert, well developed, in no acute distress. ?HEENT: Conjunctivae and lids unremarkable. ?Cardiovascular: Regular rhythm.  ?Lungs: Normal work of breathing. ?Neurologic: No focal deficits.  ? ?Lab Results  ?Component Value Date  ? CREATININE 0.71 03/02/2022  ? BUN 9 03/02/2022  ? NA 144 03/02/2022  ? K 4.5 03/02/2022  ? CL 105 03/02/2022  ? CO2 21 03/02/2022  ? ?Lab Results  ?Component Value Date  ? ALT 19 03/02/2022  ? AST 24 03/02/2022  ? ALKPHOS 57 03/02/2022  ? BILITOT 0.6 03/02/2022  ? ?Lab Results  ?Component Value Date  ? HGBA1C 5.5 03/02/2022  ? HGBA1C 5.9 (H) 08/11/2020  ? HGBA1C 6.0 12/10/2018  ? HGBA1C 5.9 07/21/2014  ? HGBA1C 5.6 07/25/2010  ? ?Lab Results  ?Component Value Date  ? INSULIN 20.0 03/02/2022  ? ?Lab Results  ?Component Value Date  ? TSH 4.540 (H) 03/02/2022  ? ?Lab Results  ?Component Value Date  ? CHOL 200 (H) 03/02/2022  ? HDL 53 03/02/2022  ?  LDLCALC 128 (H) 03/02/2022  ? TRIG 108 03/02/2022  ? CHOLHDL 3.4 08/11/2020  ? ?Lab Results  ?Component Value Date  ? WBC 9.3 03/02/2022  ? HGB 14.4 03/02/2022  ? HCT 44.1 03/02/2022  ? MCV 84 03/02/2022  ? PLT 323 03/02/2022  ? ?Lab Results  ?Component Value Date  ? IRON 93 04/17/2017  ? FERRITIN 45.1 04/03/2016  ? ?Attestation Statements:  ? ?Reviewed by clinician on day of visit: allergies, medications, problem list, medical history, surgical history, family history, social history, and previous encounter notes. ? ?Time spent on visit including pre-visit chart review and post-visit charting and care was 60 minutes.  ? ? ?I, Trixie Dredge, am acting as transcriptionist for Dennard Nip, MD. ? ?I have reviewed the above documentation for accuracy and completeness, and I agree with the above. - Dennard Nip, MD ? ? ?

## 2022-03-15 DIAGNOSIS — M9903 Segmental and somatic dysfunction of lumbar region: Secondary | ICD-10-CM | POA: Diagnosis not present

## 2022-03-16 ENCOUNTER — Encounter (INDEPENDENT_AMBULATORY_CARE_PROVIDER_SITE_OTHER): Payer: Self-pay | Admitting: Family Medicine

## 2022-03-16 ENCOUNTER — Ambulatory Visit (INDEPENDENT_AMBULATORY_CARE_PROVIDER_SITE_OTHER): Payer: Medicare Other | Admitting: Family Medicine

## 2022-03-16 VITALS — BP 130/78 | HR 91 | Temp 98.2°F | Ht 62.0 in | Wt 197.0 lb

## 2022-03-16 DIAGNOSIS — R7303 Prediabetes: Secondary | ICD-10-CM | POA: Diagnosis not present

## 2022-03-16 DIAGNOSIS — R7989 Other specified abnormal findings of blood chemistry: Secondary | ICD-10-CM | POA: Diagnosis not present

## 2022-03-16 DIAGNOSIS — E559 Vitamin D deficiency, unspecified: Secondary | ICD-10-CM | POA: Diagnosis not present

## 2022-03-16 DIAGNOSIS — E669 Obesity, unspecified: Secondary | ICD-10-CM | POA: Diagnosis not present

## 2022-03-16 DIAGNOSIS — Z6836 Body mass index (BMI) 36.0-36.9, adult: Secondary | ICD-10-CM

## 2022-03-20 DIAGNOSIS — M545 Low back pain, unspecified: Secondary | ICD-10-CM | POA: Diagnosis not present

## 2022-03-20 DIAGNOSIS — M542 Cervicalgia: Secondary | ICD-10-CM | POA: Diagnosis not present

## 2022-03-22 NOTE — Progress Notes (Signed)
Chief Complaint:   OBESITY Allison Bell is here to discuss her progress with her obesity treatment plan along with follow-up of her obesity related diagnoses. Allison Bell is on the Category 2 Plan + 100 calories minus the bread and states she is following her eating plan approximately 75% of the time. Allison Bell states she is walking for 15-20 minutes 5 times per week.  Today's visit was #: 2 Starting weight: 204 lbs Starting date: 03/02/2022 Today's weight: 197 lbs Today's date: 03/16/2022 Total lbs lost to date: 7 Total lbs lost since last in-office visit: 7  Interim History: Allison Bell did very well on her plan. She had to make a lot of substitutions as she is trying to eat gluten free, and she has tried to minimize dairy. She also struggled to eat all of the protein as well. She would like to do a protein detox supplement that she bought from another physician in [place].  Subjective:   1. Pre-diabetes Allison Bell's glucose and insulin are elevated, and she notes polyphagia. I discussed labs with the patient today.  2. Vitamin D deficiency Allison Bell's Vit D level is close to being over-replaced. I discussed labs with the patient today.   3. High thyroid stimulating hormone (TSH) level Allison Bell is on natural thyroid medications. I discussed labs with the patient today.   Assessment/Plan:   1. Pre-diabetes Allison Bell will continue with her diet, exercise, and weight loss.   2. Vitamin D deficiency Allison Bell agreed to discontinue Vitamin D, and we will recheck labs in 3 months.   3. High thyroid stimulating hormone (TSH) level Allison Bell is to discussed her labs with her naturopath and they will manage.  4. Obesity, current BMI 36.1 Allison Bell is currently in the action stage of change. As such, her goal is to continue with weight loss efforts. She has agreed to the Category 2 Plan + 100 calories minus the bread.   Exercise goals: As is.  Behavioral modification strategies: increasing lean protein  intake.  Allison Bell has agreed to follow-up with our clinic in 2 weeks. She was informed of the importance of frequent follow-up visits to maximize her success with intensive lifestyle modifications for her multiple health conditions.   Objective:   Blood pressure 130/78, pulse 91, temperature 98.2 F (36.8 C), height 5\' 2"  (1.575 m), weight 197 lb (89.4 kg), SpO2 98 %. Body mass index is 36.03 kg/m.  General: Cooperative, alert, well developed, in no acute distress. HEENT: Conjunctivae and lids unremarkable. Cardiovascular: Regular rhythm.  Lungs: Normal work of breathing. Neurologic: No focal deficits.   Lab Results  Component Value Date   CREATININE 0.71 03/02/2022   BUN 9 03/02/2022   NA 144 03/02/2022   K 4.5 03/02/2022   CL 105 03/02/2022   CO2 21 03/02/2022   Lab Results  Component Value Date   ALT 19 03/02/2022   AST 24 03/02/2022   ALKPHOS 57 03/02/2022   BILITOT 0.6 03/02/2022   Lab Results  Component Value Date   HGBA1C 5.5 03/02/2022   HGBA1C 5.9 (H) 08/11/2020   HGBA1C 6.0 12/10/2018   HGBA1C 5.9 07/21/2014   HGBA1C 5.6 07/25/2010   Lab Results  Component Value Date   INSULIN 20.0 03/02/2022   Lab Results  Component Value Date   TSH 4.540 (H) 03/02/2022   Lab Results  Component Value Date   CHOL 200 (H) 03/02/2022   HDL 53 03/02/2022   LDLCALC 128 (H) 03/02/2022   TRIG 108 03/02/2022   CHOLHDL 3.4 08/11/2020  Lab Results  Component Value Date   VD25OH 88.4 03/02/2022   VD25OH 60 08/11/2020   VD25OH 54.8 12/10/2018   Lab Results  Component Value Date   WBC 9.3 03/02/2022   HGB 14.4 03/02/2022   HCT 44.1 03/02/2022   MCV 84 03/02/2022   PLT 323 03/02/2022   Lab Results  Component Value Date   IRON 93 04/17/2017   FERRITIN 45.1 04/03/2016   Attestation Statements:   Reviewed by clinician on day of visit: allergies, medications, problem list, medical history, surgical history, family history, social history, and previous encounter  notes.  Time spent on visit including pre-visit chart review and post-visit care and charting was 45 minutes.    I, Burt Knack, am acting as transcriptionist for Quillian Quince, MD.  I have reviewed the above documentation for accuracy and completeness, and I agree with the above. -  Quillian Quince, MD
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
## 2022-03-28 ENCOUNTER — Ambulatory Visit: Payer: Medicare Other | Admitting: Sports Medicine

## 2022-03-28 VITALS — BP 138/80 | HR 93 | Ht 62.0 in | Wt 201.0 lb

## 2022-03-28 DIAGNOSIS — M545 Low back pain, unspecified: Secondary | ICD-10-CM | POA: Diagnosis not present

## 2022-03-28 DIAGNOSIS — G8929 Other chronic pain: Secondary | ICD-10-CM | POA: Diagnosis not present

## 2022-03-28 DIAGNOSIS — M25551 Pain in right hip: Secondary | ICD-10-CM | POA: Diagnosis not present

## 2022-03-28 DIAGNOSIS — M542 Cervicalgia: Secondary | ICD-10-CM | POA: Diagnosis not present

## 2022-03-28 NOTE — Patient Instructions (Addendum)
Good to see you  ?Continue tylenol as needed  ?Continue HEP  and physical therapy  ?Continue seeing weight loss clinic ?4-6 week follow up  ? ?

## 2022-03-29 DIAGNOSIS — M9903 Segmental and somatic dysfunction of lumbar region: Secondary | ICD-10-CM | POA: Diagnosis not present

## 2022-04-02 DIAGNOSIS — M542 Cervicalgia: Secondary | ICD-10-CM | POA: Diagnosis not present

## 2022-04-02 DIAGNOSIS — M545 Low back pain, unspecified: Secondary | ICD-10-CM | POA: Diagnosis not present

## 2022-04-10 DIAGNOSIS — M545 Low back pain, unspecified: Secondary | ICD-10-CM | POA: Diagnosis not present

## 2022-04-10 DIAGNOSIS — M542 Cervicalgia: Secondary | ICD-10-CM | POA: Diagnosis not present

## 2022-04-11 ENCOUNTER — Encounter (INDEPENDENT_AMBULATORY_CARE_PROVIDER_SITE_OTHER): Payer: Self-pay | Admitting: Family Medicine

## 2022-04-11 ENCOUNTER — Ambulatory Visit (INDEPENDENT_AMBULATORY_CARE_PROVIDER_SITE_OTHER): Payer: Medicare Other | Admitting: Family Medicine

## 2022-04-11 VITALS — BP 183/77 | HR 79 | Temp 97.9°F | Ht 62.0 in | Wt 195.0 lb

## 2022-04-11 DIAGNOSIS — Z6835 Body mass index (BMI) 35.0-35.9, adult: Secondary | ICD-10-CM | POA: Diagnosis not present

## 2022-04-11 DIAGNOSIS — R030 Elevated blood-pressure reading, without diagnosis of hypertension: Secondary | ICD-10-CM | POA: Insufficient documentation

## 2022-04-11 DIAGNOSIS — K5900 Constipation, unspecified: Secondary | ICD-10-CM

## 2022-04-11 DIAGNOSIS — E669 Obesity, unspecified: Secondary | ICD-10-CM

## 2022-04-18 DIAGNOSIS — M545 Low back pain, unspecified: Secondary | ICD-10-CM | POA: Diagnosis not present

## 2022-04-18 DIAGNOSIS — M542 Cervicalgia: Secondary | ICD-10-CM | POA: Diagnosis not present

## 2022-04-19 DIAGNOSIS — M9903 Segmental and somatic dysfunction of lumbar region: Secondary | ICD-10-CM | POA: Diagnosis not present

## 2022-04-23 ENCOUNTER — Encounter (INDEPENDENT_AMBULATORY_CARE_PROVIDER_SITE_OTHER): Payer: Self-pay | Admitting: Family Medicine

## 2022-04-23 ENCOUNTER — Ambulatory Visit (INDEPENDENT_AMBULATORY_CARE_PROVIDER_SITE_OTHER): Payer: Medicare Other | Admitting: Family Medicine

## 2022-04-23 VITALS — BP 168/82 | HR 89 | Temp 98.3°F | Ht 62.0 in | Wt 191.0 lb

## 2022-04-23 DIAGNOSIS — Z6835 Body mass index (BMI) 35.0-35.9, adult: Secondary | ICD-10-CM

## 2022-04-23 DIAGNOSIS — E669 Obesity, unspecified: Secondary | ICD-10-CM

## 2022-04-23 DIAGNOSIS — I10 Essential (primary) hypertension: Secondary | ICD-10-CM

## 2022-04-23 NOTE — Progress Notes (Signed)
Chief Complaint:   OBESITY Allison Bell is here to discuss her progress with her obesity treatment plan along with follow-up of her obesity related diagnoses. Allison Bell is on {[email or web address]/plan2:23431} and states she is following her eating plan approximately ***% of the time. Allison Bell states she is *** *** minutes *** times per week.  Today's visit was #: *** Starting weight: *** Starting date: *** Today's weight: *** Today's date: 04/11/2022 Total lbs lost to date: *** Total lbs lost since last in-office visit: ***  Interim History: ***  Subjective:   1. Constipation, unspecified constipation type ***  2. Elevated blood pressure reading without diagnosis of hypertension ***  Assessment/Plan:   1. Constipation, unspecified constipation type ***  2. Elevated blood pressure reading without diagnosis of hypertension ***  3. Obesity, Current BMI 35.8 Allison Bell is currently in the action stage of change. As such, her goal is to continue with weight loss efforts. She has agreed to the Category 2 Plan.   We discussed non-dairy, non-meat, and non-soy sources of protein.   Exercise goals: As is.  Behavioral modification strategies: increasing lean protein intake.  Allison Bell has agreed to follow-up with our clinic in 2 to 3 weeks. She was informed of the importance of frequent follow-up visits to maximize her success with intensive lifestyle modifications for her multiple health conditions.   Objective:   Blood pressure (!) 183/77, pulse 79, temperature 97.9 F (36.6 C), height 5\' 2"  (1.575 m), weight 195 lb (88.5 kg), SpO2 98 %. Body mass index is 35.67 kg/m.  General: Cooperative, alert, well developed, in no acute distress. HEENT: Conjunctivae and lids unremarkable. Cardiovascular: Regular rhythm.  Lungs: Normal work of breathing. Neurologic: No focal deficits.   Lab Results  Component Value Date   CREATININE 0.71 03/02/2022   BUN 9 03/02/2022   NA 144 03/02/2022   K  4.5 03/02/2022   CL 105 03/02/2022   CO2 21 03/02/2022   Lab Results  Component Value Date   ALT 19 03/02/2022   AST 24 03/02/2022   ALKPHOS 57 03/02/2022   BILITOT 0.6 03/02/2022   Lab Results  Component Value Date   HGBA1C 5.5 03/02/2022   HGBA1C 5.9 (H) 08/11/2020   HGBA1C 6.0 12/10/2018   HGBA1C 5.9 07/21/2014   HGBA1C 5.6 07/25/2010   Lab Results  Component Value Date   INSULIN 20.0 03/02/2022   Lab Results  Component Value Date   TSH 4.540 (H) 03/02/2022   Lab Results  Component Value Date   CHOL 200 (H) 03/02/2022   HDL 53 03/02/2022   LDLCALC 128 (H) 03/02/2022   TRIG 108 03/02/2022   CHOLHDL 3.4 08/11/2020   Lab Results  Component Value Date   VD25OH 88.4 03/02/2022   VD25OH 60 08/11/2020   VD25OH 54.8 12/10/2018   Lab Results  Component Value Date   WBC 9.3 03/02/2022   HGB 14.4 03/02/2022   HCT 44.1 03/02/2022   MCV 84 03/02/2022   PLT 323 03/02/2022   Lab Results  Component Value Date   IRON 93 04/17/2017   FERRITIN 45.1 04/03/2016   Attestation Statements:   Reviewed by clinician on day of visit: allergies, medications, problem list, medical history, surgical history, family history, social history, and previous encounter notes.  Time spent on visit including pre-visit chart review and post-visit care and charting was 40 minutes.   I, 04/05/2016, am acting as transcriptionist for Burt Knack, MD.  I have reviewed the above documentation for accuracy and completeness,  and I agree with the above. -  ***

## 2022-04-24 DIAGNOSIS — M542 Cervicalgia: Secondary | ICD-10-CM | POA: Diagnosis not present

## 2022-04-24 DIAGNOSIS — M545 Low back pain, unspecified: Secondary | ICD-10-CM | POA: Diagnosis not present

## 2022-04-25 ENCOUNTER — Ambulatory Visit (INDEPENDENT_AMBULATORY_CARE_PROVIDER_SITE_OTHER): Payer: PPO | Admitting: Family Medicine

## 2022-04-25 NOTE — Progress Notes (Signed)
Chief Complaint:   OBESITY Allison Bell is here to discuss her progress with her obesity treatment plan along with follow-up of her obesity related diagnoses. Allison Bell is on the Category 2 Plan and states she is following her eating plan approximately 95% of the time. Allison Bell states she is walking for 15 minutes 6 times per week, and doing physical therapy for 50 minutes 1 time per week.  Today's visit was #: 4 Starting weight: 204 lbs Starting date: 03/02/2022 Today's weight: 191 lbs Today's date: 04/23/2022 Total lbs lost to date: 13 Total lbs lost since last in-office visit: 4  Interim History: Allison Bell continues to do well with weight loss. She is meeting her calorie goal reliably. She is doing better with meeting her protein goal. She is active most days and she is doing physical therapy as well.   Subjective:   1. Essential hypertension Allison Bell's blood pressure is elevated today even after sitting quietly and rechecked. She is not on blood pressure medications currently, but she is doing well with weight loss. Her blood pressure at home was 144 systolic. She states it is in normal range at other offices, especially when taken manually.   Assessment/Plan:   1. Essential hypertension Allison Bell will continue to check her blood pressure at home, and we will recheck her blood pressure manually at her next visit and will follow closely.   2. Obesity, Current BMI 35.1 Allison Bell is currently in the action stage of change. As such, her goal is to continue with weight loss efforts. She has agreed to keeping a food journal and adhering to recommended goals of 1100-1300 calories and 80+ grams of protein daily.   Exercise goals: As is.   Behavioral modification strategies: increasing lean protein intake.  Allison Bell has agreed to follow-up with our clinic in 2 to 3 weeks. She was informed of the importance of frequent follow-up visits to maximize her success with intensive lifestyle modifications for her  multiple health conditions.   Objective:   Blood pressure (!) 168/82, pulse 89, temperature 98.3 F (36.8 C), height 5\' 2"  (1.575 m), weight 191 lb (86.6 kg), SpO2 98 %. Body mass index is 34.93 kg/m.  General: Cooperative, alert, well developed, in no acute distress. HEENT: Conjunctivae and lids unremarkable. Cardiovascular: Regular rhythm.  Lungs: Normal work of breathing. Neurologic: No focal deficits.   Lab Results  Component Value Date   CREATININE 0.71 03/02/2022   BUN 9 03/02/2022   NA 144 03/02/2022   K 4.5 03/02/2022   CL 105 03/02/2022   CO2 21 03/02/2022   Lab Results  Component Value Date   ALT 19 03/02/2022   AST 24 03/02/2022   ALKPHOS 57 03/02/2022   BILITOT 0.6 03/02/2022   Lab Results  Component Value Date   HGBA1C 5.5 03/02/2022   HGBA1C 5.9 (H) 08/11/2020   HGBA1C 6.0 12/10/2018   HGBA1C 5.9 07/21/2014   HGBA1C 5.6 07/25/2010   Lab Results  Component Value Date   INSULIN 20.0 03/02/2022   Lab Results  Component Value Date   TSH 4.540 (H) 03/02/2022   Lab Results  Component Value Date   CHOL 200 (H) 03/02/2022   HDL 53 03/02/2022   LDLCALC 128 (H) 03/02/2022   TRIG 108 03/02/2022   CHOLHDL 3.4 08/11/2020   Lab Results  Component Value Date   VD25OH 88.4 03/02/2022   VD25OH 60 08/11/2020   VD25OH 54.8 12/10/2018   Lab Results  Component Value Date   WBC 9.3 03/02/2022  HGB 14.4 03/02/2022   HCT 44.1 03/02/2022   MCV 84 03/02/2022   PLT 323 03/02/2022   Lab Results  Component Value Date   IRON 93 04/17/2017   FERRITIN 45.1 04/03/2016   Attestation Statements:   Reviewed by clinician on day of visit: allergies, medications, problem list, medical history, surgical history, family history, social history, and previous encounter notes.  Time spent on visit including pre-visit chart review and post-visit care and charting was 30 minutes.   I, Burt Knack, am acting as transcriptionist for Quillian Quince, MD.  I have  reviewed the above documentation for accuracy and completeness, and I agree with the above. -  Quillian Quince, MD

## 2022-05-01 DIAGNOSIS — M542 Cervicalgia: Secondary | ICD-10-CM | POA: Diagnosis not present

## 2022-05-01 DIAGNOSIS — M545 Low back pain, unspecified: Secondary | ICD-10-CM | POA: Diagnosis not present

## 2022-05-08 ENCOUNTER — Ambulatory Visit: Payer: Medicare Other | Admitting: Sports Medicine

## 2022-05-08 ENCOUNTER — Encounter (INDEPENDENT_AMBULATORY_CARE_PROVIDER_SITE_OTHER): Payer: Self-pay | Admitting: Family Medicine

## 2022-05-08 ENCOUNTER — Ambulatory Visit (INDEPENDENT_AMBULATORY_CARE_PROVIDER_SITE_OTHER): Payer: Medicare Other | Admitting: Family Medicine

## 2022-05-08 VITALS — BP 190/80 | HR 88 | Temp 98.1°F | Ht 62.0 in | Wt 190.0 lb

## 2022-05-08 DIAGNOSIS — Z6834 Body mass index (BMI) 34.0-34.9, adult: Secondary | ICD-10-CM | POA: Diagnosis not present

## 2022-05-08 DIAGNOSIS — M542 Cervicalgia: Secondary | ICD-10-CM | POA: Diagnosis not present

## 2022-05-08 DIAGNOSIS — E669 Obesity, unspecified: Secondary | ICD-10-CM

## 2022-05-08 DIAGNOSIS — I10 Essential (primary) hypertension: Secondary | ICD-10-CM

## 2022-05-08 DIAGNOSIS — M545 Low back pain, unspecified: Secondary | ICD-10-CM | POA: Diagnosis not present

## 2022-05-09 NOTE — Progress Notes (Signed)
Chief Complaint:   OBESITY Allison Bell is here to discuss her progress with her obesity treatment plan along with follow-up of her obesity related diagnoses. Allison Bell is on keeping a food journal and adhering to recommended goals of 1100-1300 calories and 80+ grams of protein daily and states she is following her eating plan approximately 95% of the time. Allison Bell states she is walking 5-6 times per week.    Today's visit was #: 5 Starting weight: 204 lbs Starting date: 03/02/2022 Today's weight: 190 lbs Today's date: 05/09/2022 Total lbs lost to date: 14 Total lbs lost since last in-office visit: 1  Interim History: Allison Bell continues to work on weight loss.  She is doing well with meeting her calorie and protein goals.  She is trying to remain active.  Subjective:   1. Essential hypertension Gilda's blood pressure remains elevated but she declines blood pressure medications.  Her PCP recently left and she is looking for a new PCP.  Assessment/Plan:   1. Essential hypertension Allison Bell was educated on hypertension and risk of myocardial infarction and cerebrovascular accident. She will work on decreasing sodium in her diet to below 1800 mg/day.  She is to continue to check her blood pressure at home.  We will take her blood pressure at the end of the next visit.  2. Obesity, Current BMI 34.8 Allison Bell is currently in the action stage of change. As such, her goal is to continue with weight loss efforts. She has agreed to keeping a food journal and adhering to recommended goals of 1100-1300 calories and 80+ grams of protein daily.   Exercise goals: As is.   Behavioral modification strategies: increasing lean protein intake, increasing water intake, and decreasing sodium intake.  Allison Bell has agreed to follow-up with our clinic in 3 weeks. She was informed of the importance of frequent follow-up visits to maximize her success with intensive lifestyle modifications for her multiple health  conditions.   Objective:   Blood pressure (!) 190/80, pulse 88, temperature 98.1 F (36.7 C), height 5\' 2"  (1.575 m), weight 190 lb (86.2 kg), SpO2 99 %. Body mass index is 34.75 kg/m.  General: Cooperative, alert, well developed, in no acute distress. HEENT: Conjunctivae and lids unremarkable. Cardiovascular: Regular rhythm.  Lungs: Normal work of breathing. Neurologic: No focal deficits.   Lab Results  Component Value Date   CREATININE 0.71 03/02/2022   BUN 9 03/02/2022   NA 144 03/02/2022   K 4.5 03/02/2022   CL 105 03/02/2022   CO2 21 03/02/2022   Lab Results  Component Value Date   ALT 19 03/02/2022   AST 24 03/02/2022   ALKPHOS 57 03/02/2022   BILITOT 0.6 03/02/2022   Lab Results  Component Value Date   HGBA1C 5.5 03/02/2022   HGBA1C 5.9 (H) 08/11/2020   HGBA1C 6.0 12/10/2018   HGBA1C 5.9 07/21/2014   HGBA1C 5.6 07/25/2010   Lab Results  Component Value Date   INSULIN 20.0 03/02/2022   Lab Results  Component Value Date   TSH 4.540 (H) 03/02/2022   Lab Results  Component Value Date   CHOL 200 (H) 03/02/2022   HDL 53 03/02/2022   LDLCALC 128 (H) 03/02/2022   TRIG 108 03/02/2022   CHOLHDL 3.4 08/11/2020   Lab Results  Component Value Date   VD25OH 88.4 03/02/2022   VD25OH 60 08/11/2020   VD25OH 54.8 12/10/2018   Lab Results  Component Value Date   WBC 9.3 03/02/2022   HGB 14.4 03/02/2022  HCT 44.1 03/02/2022   MCV 84 03/02/2022   PLT 323 03/02/2022   Lab Results  Component Value Date   IRON 93 04/17/2017   FERRITIN 45.1 04/03/2016   Attestation Statements:   Reviewed by clinician on day of visit: allergies, medications, problem list, medical history, surgical history, family history, social history, and previous encounter notes.  Time spent on visit including pre-visit chart review and post-visit care and charting was 42 minutes.   I, Burt Knack, am acting as transcriptionist for Quillian Quince, MD.  I have reviewed the above  documentation for accuracy and completeness, and I agree with the above. -  Quillian Quince, MD
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
## 2022-05-10 ENCOUNTER — Ambulatory Visit: Payer: Medicare Other | Admitting: Sports Medicine

## 2022-05-10 VITALS — BP 120/80 | HR 80 | Ht 62.0 in | Wt 190.0 lb

## 2022-05-10 DIAGNOSIS — M25551 Pain in right hip: Secondary | ICD-10-CM

## 2022-05-10 DIAGNOSIS — G8929 Other chronic pain: Secondary | ICD-10-CM | POA: Diagnosis not present

## 2022-05-10 DIAGNOSIS — M9903 Segmental and somatic dysfunction of lumbar region: Secondary | ICD-10-CM | POA: Diagnosis not present

## 2022-05-10 DIAGNOSIS — M545 Low back pain, unspecified: Secondary | ICD-10-CM | POA: Diagnosis not present

## 2022-05-10 NOTE — Patient Instructions (Addendum)
Good to see you  Hip strength  As needed follow up

## 2022-05-15 DIAGNOSIS — M545 Low back pain, unspecified: Secondary | ICD-10-CM | POA: Diagnosis not present

## 2022-05-15 DIAGNOSIS — M542 Cervicalgia: Secondary | ICD-10-CM | POA: Diagnosis not present

## 2022-05-24 ENCOUNTER — Encounter (INDEPENDENT_AMBULATORY_CARE_PROVIDER_SITE_OTHER): Payer: Self-pay | Admitting: Family Medicine

## 2022-05-24 ENCOUNTER — Ambulatory Visit (INDEPENDENT_AMBULATORY_CARE_PROVIDER_SITE_OTHER): Payer: Medicare Other | Admitting: Family Medicine

## 2022-05-24 VITALS — BP 188/70 | HR 74 | Temp 97.9°F | Ht 62.0 in | Wt 189.0 lb

## 2022-05-24 DIAGNOSIS — E669 Obesity, unspecified: Secondary | ICD-10-CM | POA: Diagnosis not present

## 2022-05-24 DIAGNOSIS — Z6830 Body mass index (BMI) 30.0-30.9, adult: Secondary | ICD-10-CM | POA: Diagnosis not present

## 2022-05-24 DIAGNOSIS — E785 Hyperlipidemia, unspecified: Secondary | ICD-10-CM | POA: Diagnosis not present

## 2022-05-24 DIAGNOSIS — I10 Essential (primary) hypertension: Secondary | ICD-10-CM | POA: Diagnosis not present

## 2022-05-28 NOTE — Progress Notes (Signed)
Chief Complaint:   OBESITY Allison Bell is here to discuss her progress with her obesity treatment plan along with follow-up of her obesity related diagnoses. Allison Bell is on keeping a food journal and adhering to recommended goals of 1100-1300 calories and 80+ grams of protein daily and states she is following her eating plan approximately 95% of the time. Allison Bell states she is walking for 20 minutes 5-6 times per week.  Today's visit was #: 6 Starting weight: 204 lbs Starting date: 03/02/2022 Today's weight: 189 lbs Today's date: 05/24/2022 Total lbs lost to date: 15 Total lbs lost since last in-office visit: 1  Interim History: Allison Bell has done well with weight loss.  She is working on diet and exercise, and she is trying to stay active.  She notes her hunger is starting to return, but is appropriate as she is hungry right before breakfast, lunch, and dinner.  Subjective:   1. Hyperlipidemia, unspecified hyperlipidemia type Allison Bell is working on her diet, exercise, and weight loss, and she has decreased cholesterol in her diet.  She is not on statin, and she denies chest pain.  2. White coat syndrome with hypertension Allison Bell's blood pressure is elevated today, even after being checked at the end of her visit.  It has been normal at her other physicians office and at home.  Assessment/Plan:   1. Hyperlipidemia, unspecified hyperlipidemia type Mane will continue with her diet, exercise, and weight loss.  We will recheck labs in 1 month.  2. White coat syndrome with hypertension Allison Bell is to continue with her diet and weight loss, and we will have her follow-up visit with her PCP as her blood pressure is better at those visits.  3. Obesity, Current BMI 30.6 Allison Bell is currently in the action stage of change. As such, her goal is to continue with weight loss efforts. She has agreed to keeping a food journal and adhering to recommended goals of 1200 calories and 80+ grams of protein daily.    We will recheck her fasting labs at her next visit.  Exercise goals: As is, and start chair yoga for 15 minutes 3 times per week.  Behavioral modification strategies: increasing lean protein intake and meal planning and cooking strategies.  Allison Bell has agreed to follow-up with our clinic in 2 weeks. She was informed of the importance of frequent follow-up visits to maximize her success with intensive lifestyle modifications for her multiple health conditions.   Objective:   Blood pressure (!) 188/70, pulse 74, temperature 97.9 F (36.6 C), height 5\' 2"  (1.575 m), weight 189 lb (85.7 kg), SpO2 98 %. Body mass index is 34.57 kg/m.  General: Cooperative, alert, well developed, in no acute distress. HEENT: Conjunctivae and lids unremarkable. Cardiovascular: Regular rhythm.  Lungs: Normal work of breathing. Neurologic: No focal deficits.   Lab Results  Component Value Date   CREATININE 0.71 03/02/2022   BUN 9 03/02/2022   NA 144 03/02/2022   K 4.5 03/02/2022   CL 105 03/02/2022   CO2 21 03/02/2022   Lab Results  Component Value Date   ALT 19 03/02/2022   AST 24 03/02/2022   ALKPHOS 57 03/02/2022   BILITOT 0.6 03/02/2022   Lab Results  Component Value Date   HGBA1C 5.5 03/02/2022   HGBA1C 5.9 (H) 08/11/2020   HGBA1C 6.0 12/10/2018   HGBA1C 5.9 07/21/2014   HGBA1C 5.6 07/25/2010   Lab Results  Component Value Date   INSULIN 20.0 03/02/2022   Lab Results  Component  Value Date   TSH 4.540 (H) 03/02/2022   Lab Results  Component Value Date   CHOL 200 (H) 03/02/2022   HDL 53 03/02/2022   LDLCALC 128 (H) 03/02/2022   TRIG 108 03/02/2022   CHOLHDL 3.4 08/11/2020   Lab Results  Component Value Date   VD25OH 88.4 03/02/2022   VD25OH 60 08/11/2020   VD25OH 54.8 12/10/2018   Lab Results  Component Value Date   WBC 9.3 03/02/2022   HGB 14.4 03/02/2022   HCT 44.1 03/02/2022   MCV 84 03/02/2022   PLT 323 03/02/2022   Lab Results  Component Value Date   IRON  93 04/17/2017   FERRITIN 45.1 04/03/2016   Attestation Statements:   Reviewed by clinician on day of visit: allergies, medications, problem list, medical history, surgical history, family history, social history, and previous encounter notes.   I, Burt Knack, am acting as transcriptionist for Quillian Quince, MD.  I have reviewed the above documentation for accuracy and completeness, and I agree with the above. -  Quillian Quince, MD

## 2022-05-31 DIAGNOSIS — M9903 Segmental and somatic dysfunction of lumbar region: Secondary | ICD-10-CM | POA: Diagnosis not present

## 2022-06-14 ENCOUNTER — Ambulatory Visit (INDEPENDENT_AMBULATORY_CARE_PROVIDER_SITE_OTHER): Payer: Medicare Other | Admitting: Family Medicine

## 2022-06-14 ENCOUNTER — Encounter (INDEPENDENT_AMBULATORY_CARE_PROVIDER_SITE_OTHER): Payer: Self-pay | Admitting: Family Medicine

## 2022-06-14 VITALS — HR 82 | Temp 98.0°F | Ht 62.0 in | Wt 186.0 lb

## 2022-06-14 DIAGNOSIS — R7303 Prediabetes: Secondary | ICD-10-CM

## 2022-06-14 DIAGNOSIS — E669 Obesity, unspecified: Secondary | ICD-10-CM

## 2022-06-14 DIAGNOSIS — Z6834 Body mass index (BMI) 34.0-34.9, adult: Secondary | ICD-10-CM | POA: Diagnosis not present

## 2022-06-20 NOTE — Progress Notes (Unsigned)
Chief Complaint:   OBESITY Allison Bell is here to discuss her progress with her obesity treatment plan along with follow-up of her obesity related diagnoses. Allison Bell is on keeping a food journal and adhering to recommended goals of 1200 calories and 80+ grams of protein daily and states she is following her eating plan approximately 95% of the time. Allison Bell states she is walking and physical therapy for 20 minutes 6 times per week.  Today's visit was #: 7 Starting weight: 204 lbs Starting date: 03/02/2022 Today's weight: 186 lbs Today's date: 06/14/2022 Total lbs lost to date: 18 Total lbs lost since last in-office visit: 3  Interim History: Allison Bell continues to do well with journaling. She visited a friend who accounted for her eating plan. She has questions about afternoon snacking. She is trying to be gluten and dairy free.   Subjective:   1. Pre-diabetes Allison Bell continues to do well with her diet and weight loss. She is decreasing simple carbohydrates and trying to increase lean protein.  Assessment/Plan:   1. Pre-diabetes Allison Bell will continue with her diet and exercise, and we will plan to recheck labs in the next month.  2. Obesity, Current BMI 34.0 Allison Bell is currently in the action stage of change. As such, her goal is to continue with weight loss efforts. She has agreed to keeping a food journal and adhering to recommended goals of 1200 calories and 80+ grams of protein daily.   We will recheck fasting labs at her next visit.  Exercise goals: As is.   Behavioral modification strategies: increasing lean protein intake and better snacking choices.  Allison Bell has agreed to follow-up with our clinic in 2 to 3 weeks. She was informed of the importance of frequent follow-up visits to maximize her success with intensive lifestyle modifications for her multiple health conditions.   Objective:   Pulse 82, temperature 98 F (36.7 C), height 5\' 2"  (1.575 m), weight 186 lb (84.4 kg),  SpO2 98 %. Body mass index is 34.02 kg/m.  General: Cooperative, alert, well developed, in no acute distress. HEENT: Conjunctivae and lids unremarkable. Cardiovascular: Regular rhythm.  Lungs: Normal work of breathing. Neurologic: No focal deficits.   Lab Results  Component Value Date   CREATININE 0.71 03/02/2022   BUN 9 03/02/2022   NA 144 03/02/2022   K 4.5 03/02/2022   CL 105 03/02/2022   CO2 21 03/02/2022   Lab Results  Component Value Date   ALT 19 03/02/2022   AST 24 03/02/2022   ALKPHOS 57 03/02/2022   BILITOT 0.6 03/02/2022   Lab Results  Component Value Date   HGBA1C 5.5 03/02/2022   HGBA1C 5.9 (H) 08/11/2020   HGBA1C 6.0 12/10/2018   HGBA1C 5.9 07/21/2014   HGBA1C 5.6 07/25/2010   Lab Results  Component Value Date   INSULIN 20.0 03/02/2022   Lab Results  Component Value Date   TSH 4.540 (H) 03/02/2022   Lab Results  Component Value Date   CHOL 200 (H) 03/02/2022   HDL 53 03/02/2022   LDLCALC 128 (H) 03/02/2022   TRIG 108 03/02/2022   CHOLHDL 3.4 08/11/2020   Lab Results  Component Value Date   VD25OH 88.4 03/02/2022   VD25OH 60 08/11/2020   VD25OH 54.8 12/10/2018   Lab Results  Component Value Date   WBC 9.3 03/02/2022   HGB 14.4 03/02/2022   HCT 44.1 03/02/2022   MCV 84 03/02/2022   PLT 323 03/02/2022   Lab Results  Component Value Date  IRON 93 04/17/2017   FERRITIN 45.1 04/03/2016   Attestation Statements:   Reviewed by clinician on day of visit: allergies, medications, problem list, medical history, surgical history, family history, social history, and previous encounter notes.  Time spent on visit including pre-visit chart review and post-visit care and charting was 37 minutes.   I, Burt Knack, am acting as transcriptionist for Quillian Quince, MD.  I have reviewed the above documentation for accuracy and completeness, and I agree with the above. -  Quillian Quince, MD

## 2022-06-21 DIAGNOSIS — M9903 Segmental and somatic dysfunction of lumbar region: Secondary | ICD-10-CM | POA: Diagnosis not present

## 2022-06-26 ENCOUNTER — Ambulatory Visit (INDEPENDENT_AMBULATORY_CARE_PROVIDER_SITE_OTHER): Payer: Medicare Other | Admitting: Family Medicine

## 2022-06-27 ENCOUNTER — Encounter (INDEPENDENT_AMBULATORY_CARE_PROVIDER_SITE_OTHER): Payer: Self-pay

## 2022-07-16 DIAGNOSIS — M9903 Segmental and somatic dysfunction of lumbar region: Secondary | ICD-10-CM | POA: Diagnosis not present

## 2022-07-18 ENCOUNTER — Encounter (INDEPENDENT_AMBULATORY_CARE_PROVIDER_SITE_OTHER): Payer: Self-pay | Admitting: Family Medicine

## 2022-07-18 ENCOUNTER — Ambulatory Visit (INDEPENDENT_AMBULATORY_CARE_PROVIDER_SITE_OTHER): Payer: Medicare Other | Admitting: Family Medicine

## 2022-07-18 VITALS — HR 77 | Temp 98.0°F | Ht 62.0 in | Wt 183.0 lb

## 2022-07-18 DIAGNOSIS — R7303 Prediabetes: Secondary | ICD-10-CM

## 2022-07-18 DIAGNOSIS — I10 Essential (primary) hypertension: Secondary | ICD-10-CM | POA: Diagnosis not present

## 2022-07-18 DIAGNOSIS — E038 Other specified hypothyroidism: Secondary | ICD-10-CM | POA: Diagnosis not present

## 2022-07-18 DIAGNOSIS — Z6833 Body mass index (BMI) 33.0-33.9, adult: Secondary | ICD-10-CM

## 2022-07-18 DIAGNOSIS — E785 Hyperlipidemia, unspecified: Secondary | ICD-10-CM

## 2022-07-18 DIAGNOSIS — E669 Obesity, unspecified: Secondary | ICD-10-CM

## 2022-07-18 DIAGNOSIS — E7849 Other hyperlipidemia: Secondary | ICD-10-CM | POA: Diagnosis not present

## 2022-07-24 ENCOUNTER — Ambulatory Visit (INDEPENDENT_AMBULATORY_CARE_PROVIDER_SITE_OTHER): Payer: Medicare Other | Admitting: Family Medicine

## 2022-07-24 DIAGNOSIS — I10 Essential (primary) hypertension: Secondary | ICD-10-CM | POA: Diagnosis not present

## 2022-07-24 DIAGNOSIS — E038 Other specified hypothyroidism: Secondary | ICD-10-CM | POA: Diagnosis not present

## 2022-07-24 DIAGNOSIS — R7303 Prediabetes: Secondary | ICD-10-CM | POA: Diagnosis not present

## 2022-07-24 DIAGNOSIS — E785 Hyperlipidemia, unspecified: Secondary | ICD-10-CM | POA: Diagnosis not present

## 2022-07-25 NOTE — Progress Notes (Signed)
Chief Complaint:   OBESITY Allison Bell is here to discuss her progress with her obesity treatment plan along with follow-up of her obesity related diagnoses. Allison Bell is on keeping a food journal and adhering to recommended goals of 1200 calories and 80+ grams of protein daily and states she is following her eating plan approximately 95% of the time. Allison Bell states she is walking for 20 minutes 5-6 times per week.  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  She notes some increase in hunger especially in the afternoons.  Subjective:   1. Pre-diabetes Allison Bell notes increased PM polyphagia despite following her plan. She sometimes supplements with an extra snack. She is meeting her protein goals.   2. Essential hypertension Allison Bell's blood pressure is at home is averaging between 144-165/79-85. She is working on her diet. She declines medications. She sees a Designer, jewellery at [facility].  3. Other specified hypothyroidism Allison Bell is due for labs, and she is not currently on levothyroxine.   4. Other hyperlipidemia Allison Bell has a history of elevated cholesterol. She is working on her diet and she is due for labs.   Assessment/Plan:   1. Pre-diabetes We will check labs today.  Allison Bell was offered metformin, and she will consider this in the future.   - Hemoglobin A1c - Insulin, random - VITAMIN D 25 Hydroxy (Vit-D Deficiency, Fractures) - CBC with Differential/Platelet  2. Essential hypertension We will check labs today. Allison Bell will continue with her diet and exercise, and we will continue to follow.   - CMP14+EGFR  3. Other specified hypothyroidism We will check labs today. Orders and follow up as  documented in patient record.  - TSH  4. Other hyperlipidemia We will check labs today. Cardiovascular risk and specific lipid/LDL goals reviewed.  We discussed several lifestyle modifications today. Allison Bell will continue to work on diet, exercise and weight loss efforts. Orders and follow up as documented in patient record.   - Lipid Panel With LDL/HDL Ratio  5. Obesity, Current BMI 33.5 [name] is currently in the action stage of change. As such, her goal is to continue with weight loss efforts. She has agreed to keeping a food journal and adhering to recommended goals of 1200 calories and 80+ grams of protein daily.   Exercise goals: As is.   Behavioral modification strategies: decreasing simple carbohydrates.  Allison Bell has agreed to follow-up with our clinic in 3 to 4 weeks. She was informed of the importance of frequent follow-up visits to maximize her success with intensive lifestyle modifications for her multiple health conditions.   Allison Bell was informed we would discuss her lab results at her next visit unless there is a critical issue that needs to be addressed sooner. Allison Bell agreed to keep her next visit at the agreed upon time to discuss these results.  Objective:   Pulse 77, temperature 98 F (36.7 C), height '5\' 2"'  (1.575 m), weight 183 lb (83 kg), SpO2 98 %. Body mass index is 33.47 kg/m.  General: Cooperative, alert, well developed, in no acute distress. HEENT: Conjunctivae and lids unremarkable. Cardiovascular: Regular rhythm.  Lungs: Normal work of breathing. Neurologic: No focal deficits.   Lab Results  Component Value Date   CREATININE 0.71 03/02/2022  BUN 9 03/02/2022   NA 144 03/02/2022   K 4.5 03/02/2022   CL 105 03/02/2022   CO2 21 03/02/2022   Lab Results  Component Value Date   ALT 19 03/02/2022   AST 24 03/02/2022   ALKPHOS 57 03/02/2022   BILITOT 0.6 03/02/2022   Lab Results  Component Value Date   HGBA1C 5.5 03/02/2022   HGBA1C 5.9 (H)  08/11/2020   HGBA1C 6.0 12/10/2018   HGBA1C 5.9 07/21/2014   HGBA1C 5.6 07/25/2010   Lab Results  Component Value Date   INSULIN 20.0 03/02/2022   Lab Results  Component Value Date   TSH 4.540 (H) 03/02/2022   Lab Results  Component Value Date   CHOL 200 (H) 03/02/2022   HDL 53 03/02/2022   LDLCALC 128 (H) 03/02/2022   TRIG 108 03/02/2022   CHOLHDL 3.4 08/11/2020   Lab Results  Component Value Date   VD25OH 88.4 03/02/2022   VD25OH 60 08/11/2020   VD25OH 54.8 12/10/2018   Lab Results  Component Value Date   WBC 9.3 03/02/2022   HGB 14.4 03/02/2022   HCT 44.1 03/02/2022   MCV 84 03/02/2022   PLT 323 03/02/2022   Lab Results  Component Value Date   IRON 93 04/17/2017   FERRITIN 45.1 04/03/2016   Attestation Statements:   Reviewed by clinician on day of visit: allergies, medications, problem list, medical history, surgical history, family history, social history, and previous encounter notes.   I, Trixie Dredge, am acting as transcriptionist for Dennard Nip, MD.  I have reviewed the above documentation for accuracy and completeness, and I agree with the above. -  Dennard Nip, MD

## 2022-07-26 LAB — HEMOGLOBIN A1C
Est. average glucose Bld gHb Est-mCnc: 103 mg/dL
Hgb A1c MFr Bld: 5.2 % (ref 4.8–5.6)

## 2022-07-26 LAB — CBC WITH DIFFERENTIAL/PLATELET
Basophils Absolute: 0.1 10*3/uL (ref 0.0–0.2)
Basos: 1 %
EOS (ABSOLUTE): 0.2 10*3/uL (ref 0.0–0.4)
Eos: 2 %
Hematocrit: 43.5 % (ref 34.0–46.6)
Hemoglobin: 14.3 g/dL (ref 11.1–15.9)
Immature Grans (Abs): 0 10*3/uL (ref 0.0–0.1)
Immature Granulocytes: 0 %
Lymphocytes Absolute: 2.2 10*3/uL (ref 0.7–3.1)
Lymphs: 22 %
MCH: 28.7 pg (ref 26.6–33.0)
MCHC: 32.9 g/dL (ref 31.5–35.7)
MCV: 87 fL (ref 79–97)
Monocytes Absolute: 0.6 10*3/uL (ref 0.1–0.9)
Monocytes: 6 %
Neutrophils Absolute: 6.8 10*3/uL (ref 1.4–7.0)
Neutrophils: 69 %
Platelets: 277 10*3/uL (ref 150–450)
RBC: 4.98 x10E6/uL (ref 3.77–5.28)
RDW: 12 % (ref 11.7–15.4)
WBC: 9.9 10*3/uL (ref 3.4–10.8)

## 2022-07-26 LAB — VITAMIN D 25 HYDROXY (VIT D DEFICIENCY, FRACTURES): Vit D, 25-Hydroxy: 65.2 ng/mL (ref 30.0–100.0)

## 2022-07-26 LAB — CMP14+EGFR
ALT: 18 IU/L (ref 0–32)
AST: 25 IU/L (ref 0–40)
Albumin/Globulin Ratio: 2.3 — ABNORMAL HIGH (ref 1.2–2.2)
Albumin: 4.6 g/dL (ref 3.8–4.8)
Alkaline Phosphatase: 68 IU/L (ref 44–121)
BUN/Creatinine Ratio: 15 (ref 12–28)
BUN: 10 mg/dL (ref 8–27)
Bilirubin Total: 0.4 mg/dL (ref 0.0–1.2)
CO2: 23 mmol/L (ref 20–29)
Calcium: 9.1 mg/dL (ref 8.7–10.3)
Chloride: 102 mmol/L (ref 96–106)
Creatinine, Ser: 0.66 mg/dL (ref 0.57–1.00)
Globulin, Total: 2 g/dL (ref 1.5–4.5)
Glucose: 94 mg/dL (ref 70–99)
Potassium: 4.2 mmol/L (ref 3.5–5.2)
Sodium: 139 mmol/L (ref 134–144)
Total Protein: 6.6 g/dL (ref 6.0–8.5)
eGFR: 94 mL/min/{1.73_m2} (ref >59)

## 2022-07-26 LAB — LIPID PANEL WITH LDL/HDL RATIO
Cholesterol, Total: 178 mg/dL (ref 100–199)
HDL: 49 mg/dL (ref >39)
LDL Chol Calc (NIH): 110 mg/dL — ABNORMAL HIGH (ref 0–99)
LDL/HDL Ratio: 2.2 ratio (ref 0.0–3.2)
Triglycerides: 105 mg/dL (ref 0–149)
VLDL Cholesterol Cal: 19 mg/dL (ref 5–40)

## 2022-07-26 LAB — INSULIN, RANDOM: INSULIN: 12.1 u[IU]/mL (ref 2.6–24.9)

## 2022-07-26 LAB — TSH: TSH: 4.18 u[IU]/mL (ref 0.450–4.500)

## 2022-08-06 DIAGNOSIS — M9903 Segmental and somatic dysfunction of lumbar region: Secondary | ICD-10-CM | POA: Diagnosis not present

## 2022-08-16 ENCOUNTER — Encounter (INDEPENDENT_AMBULATORY_CARE_PROVIDER_SITE_OTHER): Payer: Self-pay | Admitting: Family Medicine

## 2022-08-16 ENCOUNTER — Ambulatory Visit (INDEPENDENT_AMBULATORY_CARE_PROVIDER_SITE_OTHER): Payer: Medicare Other | Admitting: Family Medicine

## 2022-08-16 VITALS — HR 81 | Temp 97.6°F | Ht 62.0 in | Wt 180.0 lb

## 2022-08-16 DIAGNOSIS — E559 Vitamin D deficiency, unspecified: Secondary | ICD-10-CM

## 2022-08-16 DIAGNOSIS — Z6833 Body mass index (BMI) 33.0-33.9, adult: Secondary | ICD-10-CM

## 2022-08-16 DIAGNOSIS — E7849 Other hyperlipidemia: Secondary | ICD-10-CM

## 2022-08-16 DIAGNOSIS — R7303 Prediabetes: Secondary | ICD-10-CM | POA: Diagnosis not present

## 2022-08-16 DIAGNOSIS — E669 Obesity, unspecified: Secondary | ICD-10-CM

## 2022-08-21 NOTE — Progress Notes (Signed)
Chief Complaint:   OBESITY Allison Bell is here to discuss her progress with her obesity treatment plan along with follow-up of her obesity related diagnoses. Allison Bell is on keeping a food journal and adhering to recommended goals of 1200 calories and 80+ grams of protein daily and states she is following her eating plan approximately 95% of the time. Allison Bell states she is walking for 30-40 minutes 5-6 times per week.  Today's visit was #: 9 Starting weight: 204 lbs Starting date: 03/02/2022 Today's weight: 180 lbs Today's date: 08/16/2022 Total lbs lost to date: 24 Total lbs lost since last in-office visit: 3  Interim History: Allison Bell has done well with weight loss.  She is moving food around to compensate with getting everything in.  She reports good protein intake.  She wonders what her goal weight should be.  She was encouraged by her improving mobility and weight loss.  She notes some celebration eating coming up and we discussed strategies.  Subjective:   1. Pre-diabetes Allison Bell's last A1c was 5.2 and insulin 12.1, nice improvement.  I discussed labs with the patient today.  2. Vitamin D deficiency Allison Bell's last vitamin D level was 65, and she is not on supplementation now.  I discussed labs with the patient today.  3. Other hyperlipidemia Allison Bell's LDL decreased to 110 and triglycerides decreased to 105.  I discussed labs with the patient today.  Assessment/Plan:   1. Pre-diabetes Allison Bell will continue to work on her diet and exercise.  2. Vitamin D deficiency Allison Bell will continue her diet and exercise, and she will follow-up for routine testing of Vitamin D, at least 2-3 times per year to avoid over-replacement.  3. Other hyperlipidemia Allison Bell will continue to work on diet, exercise and weight loss efforts. Orders and follow up as documented in patient record.   4. Obesity, Current BMI 33.0 Allison Bell is currently in the action stage of change. As such, her goal is to continue with  weight loss efforts. She has agreed to keeping a food journal and adhering to recommended goals of 1200 calories and 80+ grams of protein daily.   We discussed goals for fat percentage, visceral fat rating, and muscle percentage.  Ultimate goal weight of 160-165 pounds.  We discussed adding strengthening exercises to maintain muscle mass.  Exercise goals: Work on some gentle weight resistant activities with ankle weights.  Behavioral modification strategies: increasing lean protein intake, decreasing simple carbohydrates, and celebration eating strategies.  Allison Bell has agreed to follow-up with our clinic in 4 weeks. She was informed of the importance of frequent follow-up visits to maximize her success with intensive lifestyle modifications for her multiple health conditions.   Objective:   Pulse 81, temperature 97.6 F (36.4 C), height 5\' 2"  (1.575 m), weight 180 lb (81.6 kg), SpO2 99 %. Body mass index is 32.92 kg/m.  General: Cooperative, alert, well developed, in no acute distress. HEENT: Conjunctivae and lids unremarkable. Cardiovascular: Regular rhythm.  Lungs: Normal work of breathing. Neurologic: No focal deficits.   Lab Results  Component Value Date   CREATININE 0.66 07/24/2022   BUN 10 07/24/2022   NA 139 07/24/2022   K 4.2 07/24/2022   CL 102 07/24/2022   CO2 23 07/24/2022   Lab Results  Component Value Date   ALT 18 07/24/2022   AST 25 07/24/2022   ALKPHOS 68 07/24/2022   BILITOT 0.4 07/24/2022   Lab Results  Component Value Date   HGBA1C 5.2 07/24/2022   HGBA1C 5.5 03/02/2022  HGBA1C 5.9 (H) 08/11/2020   HGBA1C 6.0 12/10/2018   HGBA1C 5.9 07/21/2014   Lab Results  Component Value Date   INSULIN 12.1 07/24/2022   INSULIN 20.0 03/02/2022   Lab Results  Component Value Date   TSH 4.180 07/24/2022   Lab Results  Component Value Date   CHOL 178 07/24/2022   HDL 49 07/24/2022   LDLCALC 110 (H) 07/24/2022   TRIG 105 07/24/2022   CHOLHDL 3.4  08/11/2020   Lab Results  Component Value Date   VD25OH 65.2 07/24/2022   VD25OH 88.4 03/02/2022   VD25OH 60 08/11/2020   Lab Results  Component Value Date   WBC 9.9 07/24/2022   HGB 14.3 07/24/2022   HCT 43.5 07/24/2022   MCV 87 07/24/2022   PLT 277 07/24/2022   Lab Results  Component Value Date   IRON 93 04/17/2017   FERRITIN 45.1 04/03/2016   Attestation Statements:   Reviewed by clinician on day of visit: allergies, medications, problem list, medical history, surgical history, family history, social history, and previous encounter notes.  Time spent on visit including pre-visit chart review and post-visit care and charting was 30 minutes.   I, Burt Knack, am acting as transcriptionist for Quillian Quince, MD.  I have reviewed the above documentation for accuracy and completeness, and I agree with the above. -  Quillian Quince, MD

## 2022-08-27 DIAGNOSIS — M9903 Segmental and somatic dysfunction of lumbar region: Secondary | ICD-10-CM | POA: Diagnosis not present

## 2022-09-12 ENCOUNTER — Encounter (INDEPENDENT_AMBULATORY_CARE_PROVIDER_SITE_OTHER): Payer: Self-pay | Admitting: Family Medicine

## 2022-09-12 ENCOUNTER — Ambulatory Visit (INDEPENDENT_AMBULATORY_CARE_PROVIDER_SITE_OTHER): Payer: Medicare Other | Admitting: Family Medicine

## 2022-09-12 VITALS — HR 81 | Temp 98.3°F | Ht 62.0 in | Wt 177.0 lb

## 2022-09-12 DIAGNOSIS — R7303 Prediabetes: Secondary | ICD-10-CM

## 2022-09-12 DIAGNOSIS — Z6832 Body mass index (BMI) 32.0-32.9, adult: Secondary | ICD-10-CM | POA: Diagnosis not present

## 2022-09-12 DIAGNOSIS — E669 Obesity, unspecified: Secondary | ICD-10-CM | POA: Diagnosis not present

## 2022-09-12 DIAGNOSIS — M9903 Segmental and somatic dysfunction of lumbar region: Secondary | ICD-10-CM | POA: Diagnosis not present

## 2022-09-13 ENCOUNTER — Ambulatory Visit (INDEPENDENT_AMBULATORY_CARE_PROVIDER_SITE_OTHER): Payer: Medicare Other | Admitting: Family Medicine

## 2022-09-17 NOTE — Progress Notes (Unsigned)
     Chief Complaint:   OBESITY Allison Bell is here to discuss her progress with her obesity treatment plan along with follow-up of her obesity related diagnoses. Allison Bell is on {[email or web address]/plan2:23431} and states she is following her eating plan approximately ***% of the time. Allison Bell states she is *** *** minutes *** times per week.  Today's visit was #: *** Starting weight: *** Starting date: *** Today's weight: *** Today's date: 09/12/2022 Total lbs lost to date: *** Total lbs lost since last in-office visit: ***  Interim History: ***  Subjective:   1. Prediabetes ***  Assessment/Plan:   1. Prediabetes ***  2. Obesity, Current BMI 32.4 Allison Bell is currently in the action stage of change. As such, her goal is to continue with weight loss efforts. She has agreed to keeping a food journal and adhering to recommended goals of 1200 calories and 80+ grams of protein daily.   Exercise goals: As is. Exercising with ankle weights was discussed.   Behavioral modification strategies: increasing lean protein intake, decreasing simple carbohydrates, and holiday eating strategies .  Allison Bell has agreed to follow-up with our clinic in 4 weeks. She was informed of the importance of frequent follow-up visits to maximize her success with intensive lifestyle modifications for her multiple health conditions.   Objective:   Pulse 81, temperature 98.3 F (36.8 C), height 5\' 2"  (1.575 m), weight 177 lb (80.3 kg), SpO2 99 %. Body mass index is 32.37 kg/m.  General: Cooperative, alert, well developed, in no acute distress. HEENT: Conjunctivae and lids unremarkable. Cardiovascular: Regular rhythm.  Lungs: Normal work of breathing. Neurologic: No focal deficits.   Lab Results  Component Value Date   CREATININE 0.66 07/24/2022   BUN 10 07/24/2022   NA 139 07/24/2022   K 4.2 07/24/2022   CL 102 07/24/2022   CO2 23 07/24/2022   Lab Results  Component Value Date   ALT 18 07/24/2022   AST  25 07/24/2022   ALKPHOS 68 07/24/2022   BILITOT 0.4 07/24/2022   Lab Results  Component Value Date   HGBA1C 5.2 07/24/2022   HGBA1C 5.5 03/02/2022   HGBA1C 5.9 (H) 08/11/2020   HGBA1C 6.0 12/10/2018   HGBA1C 5.9 07/21/2014   Lab Results  Component Value Date   INSULIN 12.1 07/24/2022   INSULIN 20.0 03/02/2022   Lab Results  Component Value Date   TSH 4.180 07/24/2022   Lab Results  Component Value Date   CHOL 178 07/24/2022   HDL 49 07/24/2022   LDLCALC 110 (H) 07/24/2022   TRIG 105 07/24/2022   CHOLHDL 3.4 08/11/2020   Lab Results  Component Value Date   VD25OH 65.2 07/24/2022   VD25OH 88.4 03/02/2022   VD25OH 60 08/11/2020   Lab Results  Component Value Date   WBC 9.9 07/24/2022   HGB 14.3 07/24/2022   HCT 43.5 07/24/2022   MCV 87 07/24/2022   PLT 277 07/24/2022   Lab Results  Component Value Date   IRON 93 04/17/2017   FERRITIN 45.1 04/03/2016   Attestation Statements:   Reviewed by clinician on day of visit: allergies, medications, problem list, medical history, surgical history, family history, social history, and previous encounter notes.  Time spent on visit including pre-visit chart review and post-visit care and charting was 30 minutes.   I, Trixie Dredge, am acting as transcriptionist for Dennard Nip, MD.  I have reviewed the above documentation for accuracy and completeness, and I agree with the above. -  ***

## 2022-10-01 DIAGNOSIS — M9903 Segmental and somatic dysfunction of lumbar region: Secondary | ICD-10-CM | POA: Diagnosis not present

## 2022-10-09 ENCOUNTER — Encounter (INDEPENDENT_AMBULATORY_CARE_PROVIDER_SITE_OTHER): Payer: Self-pay | Admitting: Family Medicine

## 2022-10-09 ENCOUNTER — Telehealth (INDEPENDENT_AMBULATORY_CARE_PROVIDER_SITE_OTHER): Payer: Medicare Other | Admitting: Family Medicine

## 2022-10-09 DIAGNOSIS — E669 Obesity, unspecified: Secondary | ICD-10-CM | POA: Diagnosis not present

## 2022-10-09 DIAGNOSIS — Z6832 Body mass index (BMI) 32.0-32.9, adult: Secondary | ICD-10-CM

## 2022-10-09 DIAGNOSIS — E559 Vitamin D deficiency, unspecified: Secondary | ICD-10-CM

## 2022-10-09 DIAGNOSIS — R7303 Prediabetes: Secondary | ICD-10-CM

## 2022-10-22 DIAGNOSIS — M9903 Segmental and somatic dysfunction of lumbar region: Secondary | ICD-10-CM | POA: Diagnosis not present

## 2022-10-23 NOTE — Progress Notes (Unsigned)
TeleHealth Visit:  Due to the COVID-19 pandemic, this visit was completed with telemedicine (audio/video) technology to reduce patient and provider exposure as well as to preserve personal protective equipment.   Allison Bell has verbally consented to this TeleHealth visit. The patient is located at home, the provider is located at the [facility] office. The participants in this visit include the listed provider and patient. The visit was conducted today via MyChart video.   Chief Complaint: OBESITY Allison Bell is here to discuss her progress with her obesity treatment plan along with follow-up of her obesity related diagnoses. [name] is on the Category 2 Plan and states she is following her eating plan approximately 93% of the time. [name] states she is walking and stretching for 20 minutes 5 times per week.  Today's visit was #: 11 Starting weight: 204 lbs Starting date: 03/02/2022  Interim History: Allison Bell has done well with decreasing temptations.  She is happy with her progress and she feels better overall.  She will be hosting Thanksgiving on Thursday and other family meals on Saturday.  In addition, she has questions about loose skin and sagging in her lower abdomen.  Subjective:   1. Vitamin D deficiency Allison Bell is on a OTC vitamin D, and her last vitamin D level was at goal.  2. Pre-diabetes Allison Bell's last A1c had improved with her diet, exercise, and weight loss.  She is not on metformin.  Assessment/Plan:   1. Vitamin D deficiency Allison Bell will continue OTC Vitamin D as is. She will follow-up for routine testing of Vitamin D, at least 2-3 times per year to avoid over-replacement.  2. Pre-diabetes Allison Bell will continue her diet, exercise, and decreasing simple carbohydrates to help decrease the risk of diabetes.   3. Obesity, Current BMI 32.4 Allison Bell is currently in the action stage of change. As such, her goal is to continue with weight loss efforts. She has agreed to  the Category 2 Plan.   Doristine was encouraged to continue working on her protein and weight loss.  She may need a paniculectomy eventually after she gets to her goal.  We will continue to monitor for signs of irritation or infection.  Exercise goals: As is.   Behavioral modification strategies: holiday eating strategies .  Allison Bell has agreed to follow-up with our clinic in 4 weeks. She was informed of the importance of frequent follow-up visits to maximize her success with intensive lifestyle modifications for her multiple health conditions.  Objective:   VITALS: Per patient if applicable, see vitals. GENERAL: Alert and in no acute distress. CARDIOPULMONARY: No increased WOB. Speaking in clear sentences.  PSYCH: Pleasant and cooperative. Speech normal rate and rhythm. Affect is appropriate. Insight and judgement are appropriate. Attention is focused, linear, and appropriate.  NEURO: Oriented as arrived to appointment on time with no prompting.   Lab Results  Component Value Date   CREATININE 0.66 07/24/2022   BUN 10 07/24/2022   NA 139 07/24/2022   K 4.2 07/24/2022   CL 102 07/24/2022   CO2 23 07/24/2022   Lab Results  Component Value Date   ALT 18 07/24/2022   AST 25 07/24/2022   ALKPHOS 68 07/24/2022   BILITOT 0.4 07/24/2022   Lab Results  Component Value Date   HGBA1C 5.2 07/24/2022   HGBA1C 5.5 03/02/2022   HGBA1C 5.9 (H) 08/11/2020   HGBA1C 6.0 12/10/2018   HGBA1C 5.9 07/21/2014   Lab Results  Component Value Date   INSULIN 12.1 07/24/2022  INSULIN 20.0 03/02/2022   Lab Results  Component Value Date   TSH 4.180 07/24/2022   Lab Results  Component Value Date   CHOL 178 07/24/2022   HDL 49 07/24/2022   LDLCALC 110 (H) 07/24/2022   TRIG 105 07/24/2022   CHOLHDL 3.4 08/11/2020   Lab Results  Component Value Date   VD25OH 65.2 07/24/2022   VD25OH 88.4 03/02/2022   VD25OH 60 08/11/2020   Lab Results  Component Value Date   WBC 9.9 07/24/2022   HGB  14.3 07/24/2022   HCT 43.5 07/24/2022   MCV 87 07/24/2022   PLT 277 07/24/2022   Lab Results  Component Value Date   IRON 93 04/17/2017   FERRITIN 45.1 04/03/2016    Attestation Statements:   Reviewed by clinician on day of visit: allergies, medications, problem list, medical history, surgical history, family history, social history, and previous encounter notes.   I, Burt Knack, am acting as transcriptionist for Quillian Quince, MD.  I have reviewed the above documentation for accuracy and completeness, and I agree with the above. - Quillian Quince, MD

## 2022-11-05 DIAGNOSIS — M9903 Segmental and somatic dysfunction of lumbar region: Secondary | ICD-10-CM | POA: Diagnosis not present

## 2022-11-06 ENCOUNTER — Ambulatory Visit (INDEPENDENT_AMBULATORY_CARE_PROVIDER_SITE_OTHER): Payer: Medicare Other | Admitting: Family Medicine

## 2022-11-06 VITALS — HR 74 | Temp 97.9°F | Ht 62.0 in | Wt 174.0 lb

## 2022-11-06 DIAGNOSIS — Z6831 Body mass index (BMI) 31.0-31.9, adult: Secondary | ICD-10-CM | POA: Diagnosis not present

## 2022-11-06 DIAGNOSIS — K5900 Constipation, unspecified: Secondary | ICD-10-CM

## 2022-11-06 DIAGNOSIS — E669 Obesity, unspecified: Secondary | ICD-10-CM | POA: Diagnosis not present

## 2022-11-08 DIAGNOSIS — M9903 Segmental and somatic dysfunction of lumbar region: Secondary | ICD-10-CM | POA: Diagnosis not present

## 2022-11-21 DIAGNOSIS — M9903 Segmental and somatic dysfunction of lumbar region: Secondary | ICD-10-CM | POA: Diagnosis not present

## 2022-11-27 NOTE — Progress Notes (Signed)
Chief Complaint:   OBESITY Allison Bell is here to discuss her progress with her obesity treatment plan along with follow-up of her obesity related diagnoses. Allison Bell is on the Category 2 Plan and states she is following her eating plan approximately 90% of the time. Miller states she is walking for 20 minutes 5-6 times per week.  Today's visit was #: 12 Starting weight: 204 lbs Starting date: 03/02/2022 Today's weight: 174 lbs Today's date: 11/06/2022 Total lbs lost to date: 30 Total lbs lost since last in-office visit: 3  Interim History: Allison Bell has done well with weight loss despite having extra temptations and celebrations.  She is working on Network engineer, and she is exercising regularly.  Subjective:   1. Constipation, unspecified constipation type Shamel notes her symptoms improve when she is taking magnesium supplement.  She is also working on hydration.  Assessment/Plan:   1. Constipation, unspecified constipation type Lodie will continue to work on her hydration and fiber intake.  2. Obesity, Current BMI 31.9 Allison Bell is currently in the action stage of change. As such, her goal is to continue with weight loss efforts. She has agreed to practicing portion control and making smarter food choices, such as increasing vegetables and decreasing simple carbohydrates.   Exercise goals: As is.   Behavioral modification strategies: increasing lean protein intake.  Allison Bell has agreed to follow-up with our clinic in 5 weeks. She was informed of the importance of frequent follow-up visits to maximize her success with intensive lifestyle modifications for her multiple health conditions.   Objective:   Pulse 74, temperature 97.9 F (36.6 C), height 5\' 2"  (1.575 m), weight 174 lb (78.9 kg), SpO2 90 %. Body mass index is 31.83 kg/m.  General: Cooperative, alert, well developed, in no acute distress. HEENT: Conjunctivae and lids  unremarkable. Cardiovascular: Regular rhythm.  Lungs: Normal work of breathing. Neurologic: No focal deficits.   Lab Results  Component Value Date   CREATININE 0.66 07/24/2022   BUN 10 07/24/2022   NA 139 07/24/2022   K 4.2 07/24/2022   CL 102 07/24/2022   CO2 23 07/24/2022   Lab Results  Component Value Date   ALT 18 07/24/2022   AST 25 07/24/2022   ALKPHOS 68 07/24/2022   BILITOT 0.4 07/24/2022   Lab Results  Component Value Date   HGBA1C 5.2 07/24/2022   HGBA1C 5.5 03/02/2022   HGBA1C 5.9 (H) 08/11/2020   HGBA1C 6.0 12/10/2018   HGBA1C 5.9 07/21/2014   Lab Results  Component Value Date   INSULIN 12.1 07/24/2022   INSULIN 20.0 03/02/2022   Lab Results  Component Value Date   TSH 4.180 07/24/2022   Lab Results  Component Value Date   CHOL 178 07/24/2022   HDL 49 07/24/2022   LDLCALC 110 (H) 07/24/2022   TRIG 105 07/24/2022   CHOLHDL 3.4 08/11/2020   Lab Results  Component Value Date   VD25OH 65.2 07/24/2022   VD25OH 88.4 03/02/2022   VD25OH 60 08/11/2020   Lab Results  Component Value Date   WBC 9.9 07/24/2022   HGB 14.3 07/24/2022   HCT 43.5 07/24/2022   MCV 87 07/24/2022   PLT 277 07/24/2022   Lab Results  Component Value Date   IRON 93 04/17/2017   FERRITIN 45.1 04/03/2016   Attestation Statements:   Reviewed by clinician on day of visit: allergies, medications, problem list, medical history, surgical history, family history, social history, and previous encounter notes.   I,  Trixie Dredge, am acting as transcriptionist for Dennard Nip, MD.  I have reviewed the above documentation for accuracy and completeness, and I agree with the above. -  Dennard Nip, MD

## 2022-11-28 DIAGNOSIS — H524 Presbyopia: Secondary | ICD-10-CM | POA: Diagnosis not present

## 2022-12-06 DIAGNOSIS — M9903 Segmental and somatic dysfunction of lumbar region: Secondary | ICD-10-CM | POA: Diagnosis not present

## 2022-12-11 DIAGNOSIS — K080 Exfoliation of teeth due to systemic causes: Secondary | ICD-10-CM | POA: Diagnosis not present

## 2022-12-12 ENCOUNTER — Ambulatory Visit (INDEPENDENT_AMBULATORY_CARE_PROVIDER_SITE_OTHER): Payer: Medicare Other | Admitting: Family Medicine

## 2022-12-12 ENCOUNTER — Encounter (INDEPENDENT_AMBULATORY_CARE_PROVIDER_SITE_OTHER): Payer: Self-pay | Admitting: Family Medicine

## 2022-12-12 VITALS — BP 211/95 | HR 75 | Temp 98.0°F | Ht 62.0 in | Wt 175.0 lb

## 2022-12-12 DIAGNOSIS — I10 Essential (primary) hypertension: Secondary | ICD-10-CM | POA: Diagnosis not present

## 2022-12-12 DIAGNOSIS — F439 Reaction to severe stress, unspecified: Secondary | ICD-10-CM

## 2022-12-12 DIAGNOSIS — Z6831 Body mass index (BMI) 31.0-31.9, adult: Secondary | ICD-10-CM | POA: Insufficient documentation

## 2022-12-12 DIAGNOSIS — Z6832 Body mass index (BMI) 32.0-32.9, adult: Secondary | ICD-10-CM | POA: Diagnosis not present

## 2022-12-12 DIAGNOSIS — E669 Obesity, unspecified: Secondary | ICD-10-CM | POA: Diagnosis not present

## 2022-12-26 DIAGNOSIS — K080 Exfoliation of teeth due to systemic causes: Secondary | ICD-10-CM | POA: Diagnosis not present

## 2022-12-26 NOTE — Progress Notes (Signed)
Chief Complaint:   OBESITY Allison Bell is here to discuss her progress with her obesity treatment plan along with follow-up of her obesity related diagnoses. Allison Bell is on practicing portion control and making smarter food choices, such as increasing vegetables and decreasing simple carbohydrates and states she is following her eating plan approximately 90% of the time. Allison Bell states she is walking and stretching for 20 minutes 6 times per week.  Today's visit was #: 16 Starting weight: 204 lbs Starting date: 03/02/2022 Today's weight: 175 lbs Today's date: 12/12/2022 Total lbs lost to date: 29 Total lbs lost since last in-office visit: 0  Interim History: Allison Bell continues to work on portion control and she did well with minimizing holiday weight gain.  She is working on increasing her exercise at home and she is doing exercises she was taught to do at physical therapy.  Subjective:   1. White coat syndrome with hypertension Allison Bell's blood pressure in office remains elevated.  She has stated previously that it is normal when she checks it at home.  She is working on her weight loss to help control her blood pressure.  2. Stress Allison Bell has had a lot of stressors recently.  She is working on dealing with this and trying not to comfort eat; She feels she is doing a lot better with this than she used to.  Assessment/Plan:   1. White coat syndrome with hypertension Allison Bell is to continue to check her blood pressures at home and make sure to discuss further with her PCP.  She will continue to work on decreasing sodium.  2. Stress Allison Bell will continue to be mindful of comfort eating triggers and make strategies to help her not feel deprived.  3. BMI 32.0-32.9,adult  4. Obesity, Beginning BMI 37.31 Allison Bell is currently in the action stage of change. As such, her goal is to continue with weight loss efforts. She has agreed to practicing portion control and making smarter food choices, such as  increasing vegetables and decreasing simple carbohydrates.   Exercise goals: As is.   Behavioral modification strategies: meal planning and cooking strategies.  Allison Bell has agreed to follow-up with our clinic in 6 weeks. She was informed of the importance of frequent follow-up visits to maximize her success with intensive lifestyle modifications for her multiple health conditions.   Objective:   Blood pressure (!) 211/95, pulse 75, temperature 98 F (36.7 C), height 5\' 2"  (1.575 m), weight 175 lb (79.4 kg), SpO2 98 %. Body mass index is 32.01 kg/m.  General: Cooperative, alert, well developed, in no acute distress. HEENT: Conjunctivae and lids unremarkable. Cardiovascular: Regular rhythm.  Lungs: Normal work of breathing. Neurologic: No focal deficits.   Lab Results  Component Value Date   CREATININE 0.66 07/24/2022   BUN 10 07/24/2022   NA 139 07/24/2022   K 4.2 07/24/2022   CL 102 07/24/2022   CO2 23 07/24/2022   Lab Results  Component Value Date   ALT 18 07/24/2022   AST 25 07/24/2022   ALKPHOS 68 07/24/2022   BILITOT 0.4 07/24/2022   Lab Results  Component Value Date   HGBA1C 5.2 07/24/2022   HGBA1C 5.5 03/02/2022   HGBA1C 5.9 (H) 08/11/2020   HGBA1C 6.0 12/10/2018   HGBA1C 5.9 07/21/2014   Lab Results  Component Value Date   INSULIN 12.1 07/24/2022   INSULIN 20.0 03/02/2022   Lab Results  Component Value Date   TSH 4.180 07/24/2022   Lab Results  Component Value Date  CHOL 178 07/24/2022   HDL 49 07/24/2022   LDLCALC 110 (H) 07/24/2022   TRIG 105 07/24/2022   CHOLHDL 3.4 08/11/2020   Lab Results  Component Value Date   VD25OH 65.2 07/24/2022   VD25OH 88.4 03/02/2022   VD25OH 60 08/11/2020   Lab Results  Component Value Date   WBC 9.9 07/24/2022   HGB 14.3 07/24/2022   HCT 43.5 07/24/2022   MCV 87 07/24/2022   PLT 277 07/24/2022   Lab Results  Component Value Date   IRON 93 04/17/2017   FERRITIN 45.1 04/03/2016   Attestation  Statements:   Reviewed by clinician on day of visit: allergies, medications, problem list, medical history, surgical history, family history, social history, and previous encounter notes.  Time spent on visit including pre-visit chart review and post-visit care and charting was 35 minutes.   I, Trixie Dredge, am acting as transcriptionist for Dennard Nip, MD.  I have reviewed the above documentation for accuracy and completeness, and I agree with the above. -  Dennard Nip, MD

## 2022-12-27 DIAGNOSIS — M9903 Segmental and somatic dysfunction of lumbar region: Secondary | ICD-10-CM | POA: Diagnosis not present

## 2023-01-14 DIAGNOSIS — M9903 Segmental and somatic dysfunction of lumbar region: Secondary | ICD-10-CM | POA: Diagnosis not present

## 2023-01-16 ENCOUNTER — Ambulatory Visit (INDEPENDENT_AMBULATORY_CARE_PROVIDER_SITE_OTHER): Payer: Medicare Other | Admitting: Family Medicine

## 2023-01-28 ENCOUNTER — Ambulatory Visit (INDEPENDENT_AMBULATORY_CARE_PROVIDER_SITE_OTHER): Payer: Medicare Other | Admitting: Family Medicine

## 2023-01-28 DIAGNOSIS — M9903 Segmental and somatic dysfunction of lumbar region: Secondary | ICD-10-CM | POA: Diagnosis not present

## 2023-01-29 ENCOUNTER — Encounter (INDEPENDENT_AMBULATORY_CARE_PROVIDER_SITE_OTHER): Payer: Self-pay | Admitting: Family Medicine

## 2023-01-29 ENCOUNTER — Ambulatory Visit (INDEPENDENT_AMBULATORY_CARE_PROVIDER_SITE_OTHER): Payer: Medicare Other | Admitting: Family Medicine

## 2023-01-29 VITALS — HR 80 | Temp 97.7°F | Ht 62.0 in | Wt 174.0 lb

## 2023-01-29 DIAGNOSIS — Z6831 Body mass index (BMI) 31.0-31.9, adult: Secondary | ICD-10-CM

## 2023-01-29 DIAGNOSIS — R2689 Other abnormalities of gait and mobility: Secondary | ICD-10-CM | POA: Diagnosis not present

## 2023-01-29 DIAGNOSIS — M6284 Sarcopenia: Secondary | ICD-10-CM

## 2023-01-29 DIAGNOSIS — E669 Obesity, unspecified: Secondary | ICD-10-CM | POA: Diagnosis not present

## 2023-01-30 DIAGNOSIS — M6284 Sarcopenia: Secondary | ICD-10-CM | POA: Insufficient documentation

## 2023-01-30 DIAGNOSIS — R2689 Other abnormalities of gait and mobility: Secondary | ICD-10-CM | POA: Insufficient documentation

## 2023-02-06 NOTE — Progress Notes (Unsigned)
Chief Complaint:   OBESITY Allison Bell is here to discuss her progress with her obesity treatment plan along with follow-up of her obesity related diagnoses. Allison Bell is on practicing portion control and making smarter food choices, such as increasing vegetables and decreasing simple carbohydrates and states she is following her eating plan approximately 95% of the time. Allison Bell states she is walking for 25-30 minutes 5-6 times per week.  Today's visit was #: 14 Starting weight: 204 lbs Starting date: 03/02/2022 Today's weight: 174 lbs Today's date: 01/29/2023 Total lbs lost to date: 30 Total lbs lost since last in-office visit: 1  Interim History: Allison Bell is struggling with continuing weight loss.  She feels she is doing something to decrease her results.  Subjective:   1. Sarcopenia Allison Bell's test results show a progressive decrease in muscle mass, which is likely decreasing her RMR and stalling her weight loss results.  2. Balance problems Allison Bell is struggling with balance and core weakness increases the risk of falls.  She is willing to go to physical therapy to work on balance, gait, and strengthening.  Assessment/Plan:   1. Sarcopenia Allison Bell will work on increasing her protein intake to a minimum of 80 g/day in addition to strengthening exercises, and this was emphasized to the patient.  We will continue to monitor.  2. Balance problems Allison Bell was referred to physical therapy for balance and core strengthening.  - Ambulatory referral to Physical Therapy  3. BMI 31.0-31.9,adult  4. Obesity, Beginning BMI 37.31 Allison Bell is currently in the action stage of change. As such, her goal is to continue with weight loss efforts. She has agreed to keeping a food journal and adhering to recommended goals of 1200 calories and 80 grams of protein daily (minimum).   Allison Bell is to increase strengthening and really make sure she is getting at least 80+ grams of protein at minimum.  Exercise  goals: As is.   Behavioral modification strategies: increasing lean protein intake.  Allison Bell has agreed to follow-up with our clinic in 4 weeks. She was informed of the importance of frequent follow-up visits to maximize her success with intensive lifestyle modifications for her multiple health conditions.   Objective:   Pulse 80, temperature 97.7 F (36.5 C), height 5\' 2"  (1.575 m), weight 174 lb (78.9 kg), SpO2 99 %. Body mass index is 31.83 kg/m.  Lab Results  Component Value Date   CREATININE 0.66 07/24/2022   BUN 10 07/24/2022   NA 139 07/24/2022   K 4.2 07/24/2022   CL 102 07/24/2022   CO2 23 07/24/2022   Lab Results  Component Value Date   ALT 18 07/24/2022   AST 25 07/24/2022   ALKPHOS 68 07/24/2022   BILITOT 0.4 07/24/2022   Lab Results  Component Value Date   HGBA1C 5.2 07/24/2022   HGBA1C 5.5 03/02/2022   HGBA1C 5.9 (H) 08/11/2020   HGBA1C 6.0 12/10/2018   HGBA1C 5.9 07/21/2014   Lab Results  Component Value Date   INSULIN 12.1 07/24/2022   INSULIN 20.0 03/02/2022   Lab Results  Component Value Date   TSH 4.180 07/24/2022   Lab Results  Component Value Date   CHOL 178 07/24/2022   HDL 49 07/24/2022   LDLCALC 110 (H) 07/24/2022   TRIG 105 07/24/2022   CHOLHDL 3.4 08/11/2020   Lab Results  Component Value Date   VD25OH 65.2 07/24/2022   VD25OH 88.4 03/02/2022   VD25OH 60 08/11/2020   Lab Results  Component Value Date  WBC 9.9 07/24/2022   HGB 14.3 07/24/2022   HCT 43.5 07/24/2022   MCV 87 07/24/2022   PLT 277 07/24/2022   Lab Results  Component Value Date   IRON 93 04/17/2017   FERRITIN 45.1 04/03/2016   Attestation Statements:   Reviewed by clinician on day of visit: allergies, medications, problem list, medical history, surgical history, family history, social history, and previous encounter notes.   I, Trixie Dredge, am acting as Location manager for Dennard Nip, MD.  I have reviewed the above documentation for accuracy and  completeness, and I agree with the above. -  Dennard Nip, MD

## 2023-02-11 DIAGNOSIS — M9903 Segmental and somatic dysfunction of lumbar region: Secondary | ICD-10-CM | POA: Diagnosis not present

## 2023-02-13 DIAGNOSIS — M65341 Trigger finger, right ring finger: Secondary | ICD-10-CM | POA: Diagnosis not present

## 2023-03-04 DIAGNOSIS — M9903 Segmental and somatic dysfunction of lumbar region: Secondary | ICD-10-CM | POA: Diagnosis not present

## 2023-03-05 ENCOUNTER — Encounter (INDEPENDENT_AMBULATORY_CARE_PROVIDER_SITE_OTHER): Payer: Self-pay | Admitting: Family Medicine

## 2023-03-05 ENCOUNTER — Ambulatory Visit (INDEPENDENT_AMBULATORY_CARE_PROVIDER_SITE_OTHER): Payer: Medicare Other | Admitting: Family Medicine

## 2023-03-05 VITALS — HR 71 | Temp 97.9°F | Ht 62.0 in | Wt 173.0 lb

## 2023-03-05 DIAGNOSIS — E669 Obesity, unspecified: Secondary | ICD-10-CM | POA: Diagnosis not present

## 2023-03-05 DIAGNOSIS — F439 Reaction to severe stress, unspecified: Secondary | ICD-10-CM

## 2023-03-05 DIAGNOSIS — Z6831 Body mass index (BMI) 31.0-31.9, adult: Secondary | ICD-10-CM

## 2023-03-06 NOTE — Progress Notes (Signed)
Chief Complaint:   OBESITY Allison Bell is here to discuss her progress with her obesity treatment plan along with follow-up of her obesity related diagnoses. Allison Bell is on keeping a food journal and adhering to recommended goals of 1200 calories and 80 grams of protein and states she is following her eating plan approximately 95% of the time. Allison Bell states she is walking and doing strengthening for 25-30 minutes 6 times per week.  Today's visit was #: 15 Starting weight: 204 lbs Starting date: 03/02/2022 Today's weight: 173 lbs Today's date: 03/05/2023 Total lbs lost to date: 31 Total lbs lost since last in-office visit: 1  Interim History: Allison Bell continues to work on Orthoptist and meeting her protein and calorie goals.  She is seeing a physical therapist who is designing a program to follow that meets her abilities.  She is helping her brother who is having serious health issues.  Subjective:   1. Stress Allison Bell has had multiple family members who have been sick and this has increased her stress.  She is working on Pensions consultant eating behavior.  Assessment/Plan:   1. Stress Stress reduction techniques were discussed, and Allison Bell will work on these and we will continue to follow.  2. BMI 31.0-31.9,adult  3. Obesity, Beginning BMI 37.31 Allison Bell is currently in the action stage of change. As such, her goal is to continue with weight loss efforts. She has agreed to keeping a food journal and adhering to recommended goals of 1200 calories and 80+ grams of protein daily.   Exercise goals: As is.   Behavioral modification strategies: increasing lean protein intake.  Allison Bell has agreed to follow-up with our clinic in 4 weeks. She was informed of the importance of frequent follow-up visits to maximize her success with intensive lifestyle modifications for her multiple health conditions.   Objective:   Pulse 71, temperature 97.9 F (36.6 C), height 5\' 2"  (1.575 m), weight 173 lb  (78.5 kg), SpO2 98 %. Body mass index is 31.64 kg/m.  Lab Results  Component Value Date   CREATININE 0.66 07/24/2022   BUN 10 07/24/2022   NA 139 07/24/2022   K 4.2 07/24/2022   CL 102 07/24/2022   CO2 23 07/24/2022   Lab Results  Component Value Date   ALT 18 07/24/2022   AST 25 07/24/2022   ALKPHOS 68 07/24/2022   BILITOT 0.4 07/24/2022   Lab Results  Component Value Date   HGBA1C 5.2 07/24/2022   HGBA1C 5.5 03/02/2022   HGBA1C 5.9 (H) 08/11/2020   HGBA1C 6.0 12/10/2018   HGBA1C 5.9 07/21/2014   Lab Results  Component Value Date   INSULIN 12.1 07/24/2022   INSULIN 20.0 03/02/2022   Lab Results  Component Value Date   TSH 4.180 07/24/2022   Lab Results  Component Value Date   CHOL 178 07/24/2022   HDL 49 07/24/2022   LDLCALC 110 (H) 07/24/2022   TRIG 105 07/24/2022   CHOLHDL 3.4 08/11/2020   Lab Results  Component Value Date   VD25OH 65.2 07/24/2022   VD25OH 88.4 03/02/2022   VD25OH 60 08/11/2020   Lab Results  Component Value Date   WBC 9.9 07/24/2022   HGB 14.3 07/24/2022   HCT 43.5 07/24/2022   MCV 87 07/24/2022   PLT 277 07/24/2022   Lab Results  Component Value Date   IRON 93 04/17/2017   FERRITIN 45.1 04/03/2016   Attestation Statements:   Reviewed by clinician on day of visit: allergies, medications, problem list, medical  history, surgical history, family history, social history, and previous encounter notes.  Time spent on visit including pre-visit chart review and post-visit care and charting was 30 minutes.   I, Burt Knack, am acting as transcriptionist for Quillian Quince, MD.  I have reviewed the above documentation for accuracy and completeness, and I agree with the above. -  Quillian Quince, MD

## 2023-03-18 DIAGNOSIS — M9903 Segmental and somatic dysfunction of lumbar region: Secondary | ICD-10-CM | POA: Diagnosis not present

## 2023-04-08 DIAGNOSIS — M9903 Segmental and somatic dysfunction of lumbar region: Secondary | ICD-10-CM | POA: Diagnosis not present

## 2023-04-09 ENCOUNTER — Ambulatory Visit (INDEPENDENT_AMBULATORY_CARE_PROVIDER_SITE_OTHER): Payer: Medicare Other | Admitting: Family Medicine

## 2023-04-09 ENCOUNTER — Encounter (INDEPENDENT_AMBULATORY_CARE_PROVIDER_SITE_OTHER): Payer: Self-pay | Admitting: Family Medicine

## 2023-04-09 VITALS — HR 68 | Temp 97.6°F | Ht 62.0 in | Wt 175.0 lb

## 2023-04-09 DIAGNOSIS — R7303 Prediabetes: Secondary | ICD-10-CM

## 2023-04-09 DIAGNOSIS — E7849 Other hyperlipidemia: Secondary | ICD-10-CM

## 2023-04-09 DIAGNOSIS — E559 Vitamin D deficiency, unspecified: Secondary | ICD-10-CM | POA: Diagnosis not present

## 2023-04-09 DIAGNOSIS — E669 Obesity, unspecified: Secondary | ICD-10-CM | POA: Diagnosis not present

## 2023-04-09 DIAGNOSIS — Z6832 Body mass index (BMI) 32.0-32.9, adult: Secondary | ICD-10-CM

## 2023-04-09 NOTE — Progress Notes (Signed)
Chief Complaint:   OBESITY Allison Bell is here to discuss her progress with her obesity treatment plan along with follow-up of her obesity related diagnoses. Allison Bell is on keeping a food journal and adhering to recommended goals of 1200 calories and 80+ grams of protein and states she is following her eating plan approximately 90% of the time. Allison Bell states she is walking and doing physical therapy for 30 minutes 6 times per week.  Today's visit was #: 16 Starting weight: 204 lbs Starting date: 03/02/2022 Today's weight: 175 lbs Today's date: 04/09/2023 Total lbs lost to date: 29 Total lbs lost since last in-office visit: 0  Interim History: Allison Bell continues to try to eat healthier.  She recently decided to do a intermittent fasting plan, eating 8 hours and fasting 16 hours.  This was recommended by her integrative health care provider.  She is trying to increase her walking, both time and speed.  Her muscle mass has started to improve.  Subjective:   1. Prediabetes Allison Bell is working on her diet and weight loss.  She is not on metformin.  2. Other hyperlipidemia Allison Bell's LDL has been mildly elevated, and she is not on a statin.  She is working on decreasing cholesterol in her diet, and she is due for labs.  3. Vitamin D deficiency Allison Bell has had low vitamin D in the past.  She is on OTC vitamin D currently.  Assessment/Plan:   1. Prediabetes We will check labs today, and James will continue with her diet and exercise.   - CMP14+EGFR - Insulin, random - Hemoglobin A1c - Vitamin B12  2. Other hyperlipidemia We will check labs today, and we will follow-up at Allison Bell's next visit.   - Lipid Panel With LDL/HDL Ratio  3. Vitamin D deficiency We will check labs today, and we will follow-up at Allison Bell's next visit.   - VITAMIN D 25 Hydroxy (Vit-D Deficiency, Fractures)  4. BMI 32.0-32.9,adult  5. Obesity, Beginning BMI 37.31 Allison Bell is currently in the action stage of change.  As such, her goal is to continue with weight loss efforts. She has agreed to keeping a food journal and adhering to recommended goals of 1200 calories and 80+ grams of protein daily.   Exercise goals: As is.   Behavioral modification strategies: increasing lean protein intake and decreasing simple carbohydrates.  Allison Bell has agreed to follow-up with our clinic in 4 weeks. She was informed of the importance of frequent follow-up visits to maximize her success with intensive lifestyle modifications for her multiple health conditions.   Allison Bell was informed we would discuss her lab results at her next visit unless there is a critical issue that needs to be addressed sooner. Allison Bell agreed to keep her next visit at the agreed upon time to discuss these results.  Objective:   Pulse 68, temperature 97.6 F (36.4 C), height 5\' 2"  (1.575 m), weight 175 lb (79.4 kg), SpO2 99 %. Body mass index is 32.01 kg/m.  Lab Results  Component Value Date   CREATININE 0.66 07/24/2022   BUN 10 07/24/2022   NA 139 07/24/2022   K 4.2 07/24/2022   CL 102 07/24/2022   CO2 23 07/24/2022   Lab Results  Component Value Date   ALT 18 07/24/2022   AST 25 07/24/2022   ALKPHOS 68 07/24/2022   BILITOT 0.4 07/24/2022   Lab Results  Component Value Date   HGBA1C 5.2 07/24/2022   HGBA1C 5.5 03/02/2022   HGBA1C 5.9 (H) 08/11/2020  HGBA1C 6.0 12/10/2018   HGBA1C 5.9 07/21/2014   Lab Results  Component Value Date   INSULIN 12.1 07/24/2022   INSULIN 20.0 03/02/2022   Lab Results  Component Value Date   TSH 4.180 07/24/2022   Lab Results  Component Value Date   CHOL 178 07/24/2022   HDL 49 07/24/2022   LDLCALC 110 (H) 07/24/2022   TRIG 105 07/24/2022   CHOLHDL 3.4 08/11/2020   Lab Results  Component Value Date   VD25OH 65.2 07/24/2022   VD25OH 88.4 03/02/2022   VD25OH 60 08/11/2020   Lab Results  Component Value Date   WBC 9.9 07/24/2022   HGB 14.3 07/24/2022   HCT 43.5 07/24/2022   MCV 87  07/24/2022   PLT 277 07/24/2022   Lab Results  Component Value Date   IRON 93 04/17/2017   FERRITIN 45.1 04/03/2016   Attestation Statements:   Reviewed by clinician on day of visit: allergies, medications, problem list, medical history, surgical history, family history, social history, and previous encounter notes.   I, Burt Knack, am acting as transcriptionist for Quillian Quince, MD.  I have reviewed the above documentation for accuracy and completeness, and I agree with the above. -  Quillian Quince, MD

## 2023-04-22 DIAGNOSIS — E559 Vitamin D deficiency, unspecified: Secondary | ICD-10-CM | POA: Diagnosis not present

## 2023-04-22 DIAGNOSIS — E7849 Other hyperlipidemia: Secondary | ICD-10-CM | POA: Diagnosis not present

## 2023-04-22 DIAGNOSIS — R7303 Prediabetes: Secondary | ICD-10-CM | POA: Diagnosis not present

## 2023-04-23 LAB — VITAMIN B12: Vitamin B-12: 1032 pg/mL (ref 232–1245)

## 2023-04-24 LAB — HEMOGLOBIN A1C
Est. average glucose Bld gHb Est-mCnc: 111 mg/dL
Hgb A1c MFr Bld: 5.5 % (ref 4.8–5.6)

## 2023-04-24 LAB — LIPID PANEL WITH LDL/HDL RATIO
Cholesterol, Total: 213 mg/dL — ABNORMAL HIGH (ref 100–199)
HDL: 69 mg/dL (ref >39)
LDL Chol Calc (NIH): 127 mg/dL — ABNORMAL HIGH (ref 0–99)
LDL/HDL Ratio: 1.8 ratio (ref 0.0–3.2)
Triglycerides: 96 mg/dL (ref 0–149)
VLDL Cholesterol Cal: 17 mg/dL (ref 5–40)

## 2023-04-24 LAB — CMP14+EGFR
ALT: 19 IU/L (ref 0–32)
AST: 25 IU/L (ref 0–40)
Albumin/Globulin Ratio: 2 (ref 1.2–2.2)
Albumin: 4.5 g/dL (ref 3.8–4.8)
Alkaline Phosphatase: 66 IU/L (ref 44–121)
BUN/Creatinine Ratio: 18 (ref 12–28)
BUN: 10 mg/dL (ref 8–27)
Bilirubin Total: 0.5 mg/dL (ref 0.0–1.2)
CO2: 23 mmol/L (ref 20–29)
Calcium: 9.3 mg/dL (ref 8.7–10.3)
Chloride: 102 mmol/L (ref 96–106)
Creatinine, Ser: 0.57 mg/dL (ref 0.57–1.00)
Globulin, Total: 2.2 g/dL (ref 1.5–4.5)
Glucose: 113 mg/dL — ABNORMAL HIGH (ref 70–99)
Potassium: 4.3 mmol/L (ref 3.5–5.2)
Sodium: 140 mmol/L (ref 134–144)
Total Protein: 6.7 g/dL (ref 6.0–8.5)
eGFR: 96 mL/min/{1.73_m2} (ref >59)

## 2023-04-24 LAB — INSULIN, RANDOM: INSULIN: 12.3 u[IU]/mL (ref 2.6–24.9)

## 2023-04-24 LAB — VITAMIN D 25 HYDROXY (VIT D DEFICIENCY, FRACTURES): Vit D, 25-Hydroxy: 66.7 ng/mL (ref 30.0–100.0)

## 2023-05-02 DIAGNOSIS — M9903 Segmental and somatic dysfunction of lumbar region: Secondary | ICD-10-CM | POA: Diagnosis not present

## 2023-05-07 ENCOUNTER — Encounter (INDEPENDENT_AMBULATORY_CARE_PROVIDER_SITE_OTHER): Payer: Self-pay | Admitting: Family Medicine

## 2023-05-07 ENCOUNTER — Encounter (INDEPENDENT_AMBULATORY_CARE_PROVIDER_SITE_OTHER): Payer: Self-pay | Admitting: *Deleted

## 2023-05-07 ENCOUNTER — Ambulatory Visit (INDEPENDENT_AMBULATORY_CARE_PROVIDER_SITE_OTHER): Payer: Medicare Other | Admitting: Family Medicine

## 2023-05-07 VITALS — HR 97 | Temp 97.8°F | Ht 62.0 in | Wt 175.0 lb

## 2023-05-07 DIAGNOSIS — R7303 Prediabetes: Secondary | ICD-10-CM

## 2023-05-07 DIAGNOSIS — E669 Obesity, unspecified: Secondary | ICD-10-CM

## 2023-05-07 DIAGNOSIS — G4733 Obstructive sleep apnea (adult) (pediatric): Secondary | ICD-10-CM

## 2023-05-07 DIAGNOSIS — Z6832 Body mass index (BMI) 32.0-32.9, adult: Secondary | ICD-10-CM | POA: Diagnosis not present

## 2023-05-07 NOTE — Progress Notes (Unsigned)
Chief Complaint:   OBESITY Allison Bell is here to discuss her progress with her obesity treatment plan along with follow-up of her obesity related diagnoses. Allison Bell is on keeping a food journal and adhering to recommended goals of 1200 calories and 80+ grams of protein and states she is following her eating plan approximately 95% of the time. Allison Bell states she is waking and doing physical therapy for 30 minutes 6 times per week.  Today's visit was #: 17 Starting weight: 204 lbs Starting date: 03/02/2022 Today's weight: 175 lbs Today's date: 05/07/2023 Total lbs lost to date: 29 Total lbs lost since last in-office visit: 0  Interim History: Patient has done well with maintaining her weight.  She continues to work on increasing her protein and strengthening exercise.  Subjective:   1. Prediabetes Patient's labs was reviewed today, and her fasting glucose is elevated but her A1c is at goal.  2. OSA (obstructive sleep apnea) Patient is not on CPAP, and she has used a mouth appliance.  She has had some dental changes and she needs a new mouth appliance.  Assessment/Plan:   1. Prediabetes Patient declines metformin, and will continue to work on decreasing simple carbohydrates and continue with her exercise.  2. OSA (obstructive sleep apnea) Patient was informed of how RMR is affected by VO2, and she agreed to work on wrapping up her dental issues to have the new mouth appliance made.  3. BMI 32.0-32.9,adult  4. Obesity, Beginning BMI 37.31 Allison Bell is currently in the action stage of change. As such, her goal is to continue with weight loss efforts. She has agreed to keeping a food journal and adhering to recommended goals of 1200 calories and 80+ grams of protein daily.   We will plan to recheck her RMR at her next visit.  Exercise goals: As is.   Behavioral modification strategies: increasing lean protein intake.  Allison Bell has agreed to follow-up with our clinic in 4 weeks. She was  informed of the importance of frequent follow-up visits to maximize her success with intensive lifestyle modifications for her multiple health conditions.   Objective:   Pulse 97, temperature 97.8 F (36.6 C), height 5\' 2"  (1.575 m), weight 175 lb (79.4 kg), SpO2 98 %. Body mass index is 32.01 kg/m.  Lab Results  Component Value Date   CREATININE 0.57 04/22/2023   BUN 10 04/22/2023   NA 140 04/22/2023   K 4.3 04/22/2023   CL 102 04/22/2023   CO2 23 04/22/2023   Lab Results  Component Value Date   ALT 19 04/22/2023   AST 25 04/22/2023   ALKPHOS 66 04/22/2023   BILITOT 0.5 04/22/2023   Lab Results  Component Value Date   HGBA1C 5.5 04/22/2023   HGBA1C 5.2 07/24/2022   HGBA1C 5.5 03/02/2022   HGBA1C 5.9 (H) 08/11/2020   HGBA1C 6.0 12/10/2018   Lab Results  Component Value Date   INSULIN 12.3 04/22/2023   INSULIN 12.1 07/24/2022   INSULIN 20.0 03/02/2022   Lab Results  Component Value Date   TSH 4.180 07/24/2022   Lab Results  Component Value Date   CHOL 213 (H) 04/22/2023   HDL 69 04/22/2023   LDLCALC 127 (H) 04/22/2023   TRIG 96 04/22/2023   CHOLHDL 3.4 08/11/2020   Lab Results  Component Value Date   VD25OH 66.7 04/22/2023   VD25OH 65.2 07/24/2022   VD25OH 88.4 03/02/2022   Lab Results  Component Value Date   WBC 9.9 07/24/2022  HGB 14.3 07/24/2022   HCT 43.5 07/24/2022   MCV 87 07/24/2022   PLT 277 07/24/2022   Lab Results  Component Value Date   IRON 93 04/17/2017   FERRITIN 45.1 04/03/2016   Attestation Statements:   Reviewed by clinician on day of visit: allergies, medications, problem list, medical history, surgical history, family history, social history, and previous encounter notes.   I, Burt Knack, am acting as transcriptionist for Quillian Quince, MD.  I have reviewed the above documentation for accuracy and completeness, and I agree with the above. -  ***

## 2023-05-21 DIAGNOSIS — M9903 Segmental and somatic dysfunction of lumbar region: Secondary | ICD-10-CM | POA: Diagnosis not present

## 2023-06-06 DIAGNOSIS — M9903 Segmental and somatic dysfunction of lumbar region: Secondary | ICD-10-CM | POA: Diagnosis not present

## 2023-06-12 ENCOUNTER — Ambulatory Visit (INDEPENDENT_AMBULATORY_CARE_PROVIDER_SITE_OTHER): Payer: Medicare Other | Admitting: Family Medicine

## 2023-06-12 ENCOUNTER — Encounter (INDEPENDENT_AMBULATORY_CARE_PROVIDER_SITE_OTHER): Payer: Self-pay | Admitting: Family Medicine

## 2023-06-12 VITALS — HR 79 | Temp 98.0°F | Ht 62.0 in | Wt 177.0 lb

## 2023-06-12 DIAGNOSIS — E669 Obesity, unspecified: Secondary | ICD-10-CM | POA: Diagnosis not present

## 2023-06-12 DIAGNOSIS — E889 Metabolic disorder, unspecified: Secondary | ICD-10-CM

## 2023-06-12 DIAGNOSIS — R0602 Shortness of breath: Secondary | ICD-10-CM | POA: Diagnosis not present

## 2023-06-12 DIAGNOSIS — F3289 Other specified depressive episodes: Secondary | ICD-10-CM | POA: Diagnosis not present

## 2023-06-12 DIAGNOSIS — Z6832 Body mass index (BMI) 32.0-32.9, adult: Secondary | ICD-10-CM

## 2023-06-12 DIAGNOSIS — F32A Depression, unspecified: Secondary | ICD-10-CM | POA: Insufficient documentation

## 2023-06-12 NOTE — Progress Notes (Signed)
Chief Complaint:   OBESITY Allison Bell is here to discuss her progress with her obesity treatment plan along with follow-up of her obesity related diagnoses. Allison Bell is on keeping a food journal and adhering to recommended goals of 1200 calories and 80+ grams of protein and states she is following her eating plan approximately 95% of the time. Allison Bell states she is walking and doing stair exercise for 20-30 minutes 5 times per week.  Today's visit was #: 18 Starting weight: 204 lbs Starting date: 03/02/2022 Today's weight: 177 lbs Today's date: 06/12/2023 Total lbs lost to date: 27 Total lbs lost since last in-office visit: 0  Interim History: Patient is struggling with weight gain despite being mindful of her food choices.  She is under a lot of stress right now as well.  She wants to follow a gluten-free and dairy free diet, which makes getting enough correct nutrition for her more difficult.  Subjective:   1. SOB (shortness of breath) on exertion Patient is due to have her RMR repeated.  She denies change in symptoms.  2. Disorder of metabolism Patient's RMR has decreased in the last 3 to 4 months.  3. Emotional Eating Behavior Patient is under a lot of stress.  She will likely be moving and she is feeling overwhelmed and emotional, as her current home is the last home her husband shared with her before he died.  Assessment/Plan:   1. SOB (shortness of breath) on exertion Patient's IC today shows RMR has decreased.  And she will work on strategies to improve.  2. Disorder of metabolism We discussed how decreased adequate nutrition especially of protein and other causes of decreased arm are.  Patient will be strict about weighing and measuring  3. Emotional Eating Behavior Patient was advised on ways to decrease stress and congratulated her on not giving into excessive emotional eating behavior.  We will follow-up at her next visit in 4 weeks to see how she is doing.  4. BMI  32.0-32.9,adult  5. Obesity, Beginning BMI 37.31 Allison Bell is currently in the action stage of change. As such, her goal is to continue with weight loss efforts. She has agreed to keeping a food journal and adhering to recommended goals of 1100-1200 calories and 80+ grams of protein daily.   Patient is to go back to weighing and measuring all food to make sure she is meeting her nutritional goals every day (and not less).  Exercise goals: Patient is to increase strengthening to increase RMR.  Behavioral modification strategies: keeping a strict food journal.  Allison Bell has agreed to follow-up with our clinic in 4 weeks. She was informed of the importance of frequent follow-up visits to maximize her success with intensive lifestyle modifications for her multiple health conditions.   Objective:   Pulse 79, temperature 98 F (36.7 C), height 5\' 2"  (1.575 m), weight 177 lb (80.3 kg), SpO2 98%. Body mass index is 32.37 kg/m.  Lab Results  Component Value Date   CREATININE 0.57 04/22/2023   BUN 10 04/22/2023   NA 140 04/22/2023   K 4.3 04/22/2023   CL 102 04/22/2023   CO2 23 04/22/2023   Lab Results  Component Value Date   ALT 19 04/22/2023   AST 25 04/22/2023   ALKPHOS 66 04/22/2023   BILITOT 0.5 04/22/2023   Lab Results  Component Value Date   HGBA1C 5.5 04/22/2023   HGBA1C 5.2 07/24/2022   HGBA1C 5.5 03/02/2022   HGBA1C 5.9 (H) 08/11/2020  HGBA1C 6.0 12/10/2018   Lab Results  Component Value Date   INSULIN 12.3 04/22/2023   INSULIN 12.1 07/24/2022   INSULIN 20.0 03/02/2022   Lab Results  Component Value Date   TSH 4.180 07/24/2022   Lab Results  Component Value Date   CHOL 213 (H) 04/22/2023   HDL 69 04/22/2023   LDLCALC 127 (H) 04/22/2023   TRIG 96 04/22/2023   CHOLHDL 3.4 08/11/2020   Lab Results  Component Value Date   VD25OH 66.7 04/22/2023   VD25OH 65.2 07/24/2022   VD25OH 88.4 03/02/2022   Lab Results  Component Value Date   WBC 9.9 07/24/2022    HGB 14.3 07/24/2022   HCT 43.5 07/24/2022   MCV 87 07/24/2022   PLT 277 07/24/2022   Lab Results  Component Value Date   IRON 93 04/17/2017   FERRITIN 45.1 04/03/2016   Attestation Statements:   Reviewed by clinician on day of visit: allergies, medications, problem list, medical history, surgical history, family history, social history, and previous encounter notes.  Time spent on visit including pre-visit chart review and post-visit care and charting was 40 minutes.   I, Burt Knack, am acting as transcriptionist for Quillian Quince, MD.  I have reviewed the above documentation for accuracy and completeness, and I agree with the above. -  Quillian Quince, MD

## 2023-06-18 DIAGNOSIS — K080 Exfoliation of teeth due to systemic causes: Secondary | ICD-10-CM | POA: Diagnosis not present

## 2023-07-01 DIAGNOSIS — M9903 Segmental and somatic dysfunction of lumbar region: Secondary | ICD-10-CM | POA: Diagnosis not present

## 2023-07-10 ENCOUNTER — Encounter (INDEPENDENT_AMBULATORY_CARE_PROVIDER_SITE_OTHER): Payer: Self-pay | Admitting: Family Medicine

## 2023-07-10 ENCOUNTER — Ambulatory Visit (INDEPENDENT_AMBULATORY_CARE_PROVIDER_SITE_OTHER): Payer: Medicare Other | Admitting: Family Medicine

## 2023-07-10 VITALS — HR 77 | Temp 97.4°F | Ht 62.0 in | Wt 179.0 lb

## 2023-07-10 DIAGNOSIS — E669 Obesity, unspecified: Secondary | ICD-10-CM | POA: Diagnosis not present

## 2023-07-10 DIAGNOSIS — K080 Exfoliation of teeth due to systemic causes: Secondary | ICD-10-CM | POA: Diagnosis not present

## 2023-07-10 DIAGNOSIS — F458 Other somatoform disorders: Secondary | ICD-10-CM

## 2023-07-10 DIAGNOSIS — Z6832 Body mass index (BMI) 32.0-32.9, adult: Secondary | ICD-10-CM

## 2023-07-10 DIAGNOSIS — R7303 Prediabetes: Secondary | ICD-10-CM

## 2023-07-11 NOTE — Progress Notes (Signed)
Chief Complaint:   OBESITY Allison Bell is here to discuss her progress with her obesity treatment plan along with follow-up of her obesity related diagnoses. [name] is on keeping a food journal and adhering to recommended goals of 1100-1200 calories and 80+ grams of protein and states she is following her eating plan approximately 90% of the time. [name] states she is walking, stairs, and stretching for 25 minutes 4-5 times per week.  Today's visit was #: 19  Starting weight: 204 lbs Starting date: 03/02/2022 Today's weight: 179 lbs Today's date: 07/10/2023 Total lbs lost to date: 25 Total lbs lost since last in-office visit: 0  Interim History: Patient has been slowly gaining weight the last 4 months.  She is up 5 pounds since then.  Her RMR was checked last month and was 1368, which means she should be slowly losing weight.  She chooses to be gluten and dairy free which makes journaling more difficult.  Subjective:   1. Prediabetes Patient is working on her diet and weight loss.  She is struggling with weight loss.  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  She has a mouth appliance, and she is struggling to use this.  Assessment/Plan:   1. Prediabetes Patient was offered metformin and she declined.  She will continue with her diet and exercise.  2. Bruxism (teeth grinding) Patient is to follow-up with her dentist and consider Botox to help.  Referral was sent to Neurology with Dr. Lucia Gaskins.  - Ambulatory referral to Neurology  3. BMI 32.0-32.9,adult  4. Obesity, Beginning BMI 37.31 Allison Bell is currently in the action stage of change. As such, her goal is to continue with weight loss efforts. She has agreed to keeping a food journal and adhering to recommended goals of 1100-1200 calories and 80+ grams of protein daily.   Patient showed how to use [employer] to come up with recipes to fit her requirements.  Exercise goals: As is.    Behavioral modification strategies: increasing lean protein intake.  Allison Bell has agreed to follow-up with our clinic in 4 weeks. She was informed of the importance of frequent follow-up visits to maximize her success with intensive lifestyle modifications for her multiple health conditions.   Objective:   Pulse 77, temperature (!) 97.4 F (36.3 C), height 5\' 2"  (1.575 m), weight 179 lb (81.2 kg), SpO2 97%. Body mass index is 32.74 kg/m.  Lab Results  Component Value Date   CREATININE 0.57 04/22/2023   BUN 10 04/22/2023   NA 140 04/22/2023   K 4.3 04/22/2023   CL 102 04/22/2023   CO2 23 04/22/2023   Lab Results  Component Value Date   ALT 19 04/22/2023   AST 25 04/22/2023   ALKPHOS 66 04/22/2023   BILITOT 0.5 04/22/2023   Lab Results  Component Value Date   HGBA1C 5.5 04/22/2023   HGBA1C 5.2 07/24/2022   HGBA1C 5.5 03/02/2022   HGBA1C 5.9 (H) 08/11/2020   HGBA1C 6.0 12/10/2018   Lab Results  Component Value Date   INSULIN 12.3 04/22/2023   INSULIN 12.1 07/24/2022   INSULIN 20.0 03/02/2022   Lab Results  Component Value Date   TSH 4.180 07/24/2022   Lab Results  Component Value Date   CHOL 213 (H) 04/22/2023   HDL 69 04/22/2023   LDLCALC 127 (H) 04/22/2023   TRIG 96 04/22/2023   CHOLHDL 3.4 08/11/2020   Lab Results  Component Value Date   VD25OH 66.7 04/22/2023  VD25OH 65.2 07/24/2022   VD25OH 88.4 03/02/2022   Lab Results  Component Value Date   WBC 9.9 07/24/2022   HGB 14.3 07/24/2022   HCT 43.5 07/24/2022   MCV 87 07/24/2022   PLT 277 07/24/2022   Lab Results  Component Value Date   IRON 93 04/17/2017   FERRITIN 45.1 04/03/2016   Attestation Statements:   Reviewed by clinician on day of visit: allergies, medications, problem list, medical history, surgical history, family history, social history, and previous encounter notes.   I, Burt Knack, am acting as transcriptionist for Quillian Quince, MD.  I have reviewed the above  documentation for accuracy and completeness, and I agree with the above. -  Quillian Quince, MD

## 2023-07-15 DIAGNOSIS — M9903 Segmental and somatic dysfunction of lumbar region: Secondary | ICD-10-CM | POA: Diagnosis not present

## 2023-07-19 ENCOUNTER — Emergency Department (HOSPITAL_BASED_OUTPATIENT_CLINIC_OR_DEPARTMENT_OTHER): Payer: Medicare Other

## 2023-07-19 ENCOUNTER — Emergency Department (HOSPITAL_BASED_OUTPATIENT_CLINIC_OR_DEPARTMENT_OTHER)
Admission: EM | Admit: 2023-07-19 | Discharge: 2023-07-19 | Disposition: A | Discharge status: Home / Self Care | Payer: Medicare Other | Source: Home / Self Care | Attending: Emergency Medicine | Admitting: Emergency Medicine

## 2023-07-19 ENCOUNTER — Other Ambulatory Visit: Payer: Self-pay

## 2023-07-19 ENCOUNTER — Encounter (HOSPITAL_BASED_OUTPATIENT_CLINIC_OR_DEPARTMENT_OTHER): Payer: Self-pay | Admitting: Emergency Medicine

## 2023-07-19 DIAGNOSIS — R109 Unspecified abdominal pain: Secondary | ICD-10-CM | POA: Diagnosis not present

## 2023-07-19 DIAGNOSIS — M25551 Pain in right hip: Secondary | ICD-10-CM | POA: Diagnosis not present

## 2023-07-19 DIAGNOSIS — D1809 Hemangioma of other sites: Secondary | ICD-10-CM | POA: Diagnosis not present

## 2023-07-19 DIAGNOSIS — M545 Low back pain, unspecified: Secondary | ICD-10-CM

## 2023-07-19 DIAGNOSIS — M438X6 Other specified deforming dorsopathies, lumbar region: Secondary | ICD-10-CM | POA: Diagnosis not present

## 2023-07-19 DIAGNOSIS — M5459 Other low back pain: Secondary | ICD-10-CM | POA: Diagnosis not present

## 2023-07-19 DIAGNOSIS — Z90710 Acquired absence of both cervix and uterus: Secondary | ICD-10-CM | POA: Diagnosis not present

## 2023-07-19 LAB — URINALYSIS, ROUTINE W REFLEX MICROSCOPIC
Bilirubin Urine: NEGATIVE
Glucose, UA: NEGATIVE mg/dL
Hgb urine dipstick: NEGATIVE
Ketones, ur: NEGATIVE mg/dL
Leukocytes,Ua: NEGATIVE
Nitrite: NEGATIVE
Protein, ur: NEGATIVE mg/dL
Specific Gravity, Urine: <1.005 — ABNORMAL LOW (ref 1.005–1.030)
pH: 7 (ref 5.0–8.0)

## 2023-07-19 LAB — CBC WITH DIFFERENTIAL/PLATELET
Abs Immature Granulocytes: 0.03 K/uL (ref 0.00–0.07)
Basophils Absolute: 0.1 K/uL (ref 0.0–0.1)
Basophils Relative: 1 %
Eosinophils Absolute: 0.1 K/uL (ref 0.0–0.5)
Eosinophils Relative: 1 %
HCT: 43 % (ref 36.0–46.0)
Hemoglobin: 14.7 g/dL (ref 12.0–15.0)
Immature Granulocytes: 0 %
Lymphocytes Relative: 22 %
Lymphs Abs: 2.1 K/uL (ref 0.7–4.0)
MCH: 29.2 pg (ref 26.0–34.0)
MCHC: 34.2 g/dL (ref 30.0–36.0)
MCV: 85.3 fL (ref 80.0–100.0)
Monocytes Absolute: 0.6 K/uL (ref 0.1–1.0)
Monocytes Relative: 6 %
Neutro Abs: 7 K/uL (ref 1.7–7.7)
Neutrophils Relative %: 70 %
Platelets: 253 K/uL (ref 150–400)
RBC: 5.04 MIL/uL (ref 3.87–5.11)
RDW: 12.3 % (ref 11.5–15.5)
WBC: 9.9 K/uL (ref 4.0–10.5)
nRBC: 0 % (ref 0.0–0.2)

## 2023-07-19 LAB — COMPREHENSIVE METABOLIC PANEL WITH GFR
ALT: 17 U/L (ref 0–44)
AST: 20 U/L (ref 15–41)
Albumin: 4.4 g/dL (ref 3.5–5.0)
Alkaline Phosphatase: 49 U/L (ref 38–126)
Anion gap: 9 (ref 5–15)
BUN: 14 mg/dL (ref 8–23)
CO2: 27 mmol/L (ref 22–32)
Calcium: 8.7 mg/dL — ABNORMAL LOW (ref 8.9–10.3)
Chloride: 103 mmol/L (ref 98–111)
Creatinine, Ser: 0.62 mg/dL (ref 0.44–1.00)
GFR, Estimated: >60 mL/min (ref >60)
Glucose, Bld: 126 mg/dL — ABNORMAL HIGH (ref 70–99)
Potassium: 3.9 mmol/L (ref 3.5–5.1)
Sodium: 139 mmol/L (ref 135–145)
Total Bilirubin: 0.5 mg/dL (ref 0.3–1.2)
Total Protein: 6.8 g/dL (ref 6.5–8.1)

## 2023-07-19 LAB — LIPASE, BLOOD: Lipase: 34 U/L (ref 11–51)

## 2023-07-19 MED ORDER — ACETAMINOPHEN 500 MG PO TABS
1000.0000 mg | ORAL_TABLET | Freq: Once | ORAL | Status: AC
  Administered 2023-07-19: 1000 mg via ORAL
  Filled 2023-07-19: qty 2

## 2023-07-19 MED ORDER — SODIUM CHLORIDE 0.9 % IV BOLUS
1000.0000 mL | Freq: Once | INTRAVENOUS | Status: AC
  Administered 2023-07-19: 1000 mL via INTRAVENOUS

## 2023-07-19 MED ORDER — OXYCODONE HCL 5 MG PO TABS
2.5000 mg | ORAL_TABLET | Freq: Once | ORAL | Status: DC
  Filled 2023-07-19: qty 1

## 2023-07-19 MED ORDER — KETOROLAC TROMETHAMINE 15 MG/ML IJ SOLN
15.0000 mg | Freq: Once | INTRAMUSCULAR | Status: AC
  Administered 2023-07-19: 15 mg via INTRAVENOUS
  Filled 2023-07-19: qty 1

## 2023-07-19 NOTE — ED Notes (Signed)
Patient transported to CT 

## 2023-07-19 NOTE — ED Notes (Signed)
Pt ambulatory to restroom

## 2023-07-19 NOTE — ED Notes (Addendum)
Chart in error

## 2023-07-19 NOTE — ED Triage Notes (Signed)
Pt arrives to ED with c/o right sided flank and hip pain that started yesterday with associated abdominal pains and loose stools.

## 2023-07-19 NOTE — Discharge Instructions (Signed)
Your back pain is most likely due to a muscular strain.  There is been a lot of research on back pain, unfortunately the only thing that seems to really help is Tylenol and ibuprofen.  Relative rest is also important to not lift greater than 10 pounds bending or twisting at the waist.  Please follow-up with your family physician.  The other thing that really seems to benefit patients is physical therapy which your doctor may send you for.  Please return to the emergency department for new numbness or weakness to your arms or legs. Difficulty with urinating or urinating or pooping on yourself.  Also if you cannot feel toilet paper when you wipe or get a fever.   Take 4 over the counter ibuprofen tablets 3 times a day or 2 over-the-counter naproxen tablets twice a day for pain. Also take tylenol 1000mg(2 extra strength) four times a day.   

## 2023-07-19 NOTE — ED Provider Notes (Signed)
[place] EMERGENCY DEPARTMENT AT [facility] Provider Note   CSN: [id number] Arrival date & time: 07/19/23  0865     History  Chief Complaint  Patient presents with   Flank Pain   Hip Pain    Allison Bell is a 72 y.o. female.  22 yoF with a chief complaints of right-sided low back pain that radiates around to the abdomen.  This been going on for couple days.  She thought it was like any normal aching pain that she got but has persisted and got a bit worse.  She denies trauma to the area denies any loss of bowel or bladder denies loss Pyrtle sensation denies numbness or weakness of the leg.  She then took some what sounds like mag citrate and has had loose stools this morning and some abdominal discomfort.  She was feeling worse and so came in today for evaluation.  No fevers no history of cancer.   Flank Pain  Hip Pain       Home Medications Prior to Admission medications   Medication Sig Start Date End Date Taking? Authorizing Provider  B Complex Vitamins (B COMPLEX 1 PO) Take by mouth.    [provider]  Cholecalciferol (VITAMIN D-3 PO) Take by mouth.    [provider]  Coenzyme Q10 (COQ10) 100 MG CAPS Take by mouth.    [provider]  estradiol (ESTRACE) 0.1 MG/GM vaginal cream Place 1 Applicatorful vaginally at bedtime.    [provider]  Magnesium 100 MG CAPS Take 120 mg by mouth.    [provider]  Magnesium Ascorbate POWD by Does not apply route.    [provider]  Misc Natural Products (ADRENAL PO) Take by mouth 2 (two) times daily.    [provider]  NON FORMULARY Thyroid script 1 capsule daily.    [provider]  progesterone (PROMETRIUM) 100 MG capsule Take 100 mg by mouth daily.    [provider]  TURMERIC PO Take by mouth.    [provider]  UBIQUINOL PO Take 150 mg by mouth.    [provider]      Allergies    Epinephrine,  Penicillins, Procaine, and Vancomycin    Review of Systems   Review of Systems  Genitourinary:  Positive for flank pain.    Physical Exam Updated Vital Signs BP (!) 177/84   Pulse 90   Temp (!) 97.5 F (36.4 C) (Oral)   Resp 18   Ht 5\' 2"  (1.575 m)   Wt 80.7 kg   SpO2 100%   BMI 32.56 kg/m  Physical Exam Vitals and nursing note reviewed.  Constitutional:      General: She is not in acute distress.    Appearance: She is well-developed. She is not diaphoretic.  HENT:     Head: Normocephalic and atraumatic.  Eyes:     Pupils: Pupils are equal, round, and reactive to light.  Cardiovascular:     Rate and Rhythm: Normal rate and regular rhythm.     Heart sounds: No murmur heard.    No friction rub. No gallop.  Pulmonary:     Effort: Pulmonary effort is normal.     Breath sounds: No wheezing or rales.  Abdominal:     General: There is no distension.     Palpations: Abdomen is soft.     Tenderness: There is no abdominal tenderness.  Musculoskeletal:        General: No tenderness.  Cervical back: Normal range of motion and neck supple.     Comments: No obvious rash to the right flank.  Pulse motor and sensation intact to the right lower extremity.  Reflexes are 2+ and equal.  No clonus.  Negative straight leg raise test bilaterally.  Skin:    General: Skin is warm and dry.  Neurological:     Mental Status: She is alert and oriented to person, place, and time.  Psychiatric:        Behavior: Behavior normal.     ED Results / Procedures / Treatments   Labs (all labs ordered are listed, but only abnormal results are displayed) Labs Reviewed  COMPREHENSIVE METABOLIC PANEL - Abnormal; Notable for the following components:      Result Value   Glucose, Bld 126 (*)    Calcium 8.7 (*)    All other components within normal limits  URINALYSIS, ROUTINE W REFLEX MICROSCOPIC - Abnormal; Notable for the following components:   Color, Urine COLORLESS (*)    Specific Gravity,  Urine <1.005 (*)    All other components within normal limits  CBC WITH DIFFERENTIAL/PLATELET  LIPASE, BLOOD    EKG None  Radiology CT L-SPINE NO CHARGE  Result Date: 07/19/2023 CLINICAL DATA:  Right-sided flank pain.  Hip pain. EXAM: CT LUMBAR SPINE WITHOUT CONTRAST TECHNIQUE: Multidetector CT imaging of the lumbar spine was performed without intravenous contrast administration. Multiplanar CT image reconstructions were also generated. RADIATION DOSE REDUCTION: This exam was performed according to the departmental dose-optimization program which includes automated exposure control, adjustment of the mA and/or kV according to patient size and/or use of iterative reconstruction technique. COMPARISON:  Lumbar spine radiographs 04/28/2015 FINDINGS: Segmentation: 5 non rib-bearing lumbar type vertebral bodies are present. The lowest fully formed vertebral body is L5. Alignment: No significant listhesis is present. Slight leftward curvature is centered at L4-5. Mild straightening of the normal lumbar lordosis is present. Vertebrae: Shallow Schmorl's nodes are present from T11-12 through L3-4. Vertebral body heights are otherwise normal. Hemangiomas are incidentally noted at L1 and L2. Paraspinal and other soft tissues: The paraspinous soft tissues are within normal limits. Please see full CT abdomen and pelvis report of the same day. Disc levels: L1-2: Chronic loss of disc height is noted. Vacuum disc is present. No focal protrusion or stenosis is present. L2-3: Schmorl's nodes are present. Mild disc bulging is present without focal protrusion or stenosis. L3-4: Mild disc bulging is present. Mild facet hypertrophy is noted bilaterally. No focal protrusion or stenosis is present. L4-5: A far right lateral disc protrusion may contact the exiting L4 nerve root just beyond the foramen. The central canal is patent. The left foramen is patent. Mild facet hypertrophy is present bilaterally. L5-S1: Mild facet  hypertrophy is present bilaterally. No focal disc protrusion or stenosis is present. IMPRESSION: 1. Shallow far right lateral disc protrusion may contact the exiting L4 nerve root just beyond the foramen. 2. Mild facet hypertrophy bilaterally at L4-5 and L5-S1 without significant stenosis. 3. Chronic loss of disc height and vacuum disc at L1-2. 4. Shallow Schmorl's nodes from T11-12 through L3-4. Electronically Signed   By: Marin Roberts M.D.   On: 07/19/2023 08:09   CT Renal Stone Study  Result Date: 07/19/2023 CLINICAL DATA:  Right-sided flank pain and hip pain beginning yesterday. Associated abdominal pain and loose stools. EXAM: CT ABDOMEN AND PELVIS WITHOUT CONTRAST TECHNIQUE: Multidetector CT imaging of the abdomen and pelvis was performed following the standard protocol without IV contrast.  RADIATION DOSE REDUCTION: This exam was performed according to the departmental dose-optimization program which includes automated exposure control, adjustment of the mA and/or kV according to patient size and/or use of iterative reconstruction technique. COMPARISON:  CT of the abdomen and pelvis 05/06/2010 FINDINGS: Lower chest: The lung bases are clear without focal nodule, mass, or airspace disease. The heart size is normal. No significant pleural or pericardial effusion is present. Hepatobiliary: No focal liver abnormality is seen. No gallstones, gallbladder wall thickening, or biliary dilatation. Pancreas: Unremarkable. No pancreatic ductal dilatation or surrounding inflammatory changes. Spleen: Normal in size without focal abnormality. Adrenals/Urinary Tract: The adrenal glands are normal bilaterally. Kidneys and ureters are within normal limits bilaterally. No stone or obstruction is present. No mass lesion is present. The urinary bladder is within normal limits. Stomach/Bowel: The stomach and duodenum are within normal limits. The small bowel is unremarkable. The terminal ileum is normal. The appendix is  visualized and normal. The ascending and transverse colon are within normal limits. The descending and sigmoid colon are. The distal colon is mostly collapsed. Vascular/Lymphatic: Atherosclerotic calcifications are present within the aorta and branch vessels. No aneurysm is present. No significant adenopathy is present. Reproductive: Status post hysterectomy. No adnexal masses. Other: No abdominal wall hernia or abnormality. No abdominopelvic ascites. Musculoskeletal: The vertebral body heights and alignment are normal. Schmorl's nodes are present in the lower thoracic and upper lumbar spine. Mild straightening of the normal lumbar lordosis is present. The bony pelvis is within normal limits. The hips are located and normal bilaterally. IMPRESSION: 1. No acute or focal lesion to explain the patient's symptoms. 2. No nephrolithiasis or obstruction. 3. Mild degenerative changes in the lower thoracic and upper lumbar spine. 4.  Aortic Atherosclerosis (ICD10-I70.0). Electronically Signed   By: Marin Roberts M.D.   On: 07/19/2023 08:01    Procedures Procedures    Medications Ordered in ED Medications  oxyCODONE (Oxy IR/ROXICODONE) immediate release tablet 2.5 mg (2.5 mg Oral Not Given 07/19/23 0832)  sodium chloride 0.9 % bolus 1,000 mL (1,000 mLs Intravenous New Bag/Given 07/19/23 6295)  acetaminophen (TYLENOL) tablet 1,000 mg (1,000 mg Oral Given 07/19/23 0830)  ketorolac (TORADOL) 15 MG/ML injection 15 mg (15 mg Intravenous Given 07/19/23 2841)    ED Course/ Medical Decision Making/ A&P                                 Medical Decision Making Amount and/or Complexity of Data Reviewed Labs: ordered. Radiology: ordered.  Risk OTC drugs. Prescription drug management.   72 yo F with a chief complaints of right-sided back pain that radiates around to the abdomen.  This been going on for couple days.  Sounds musculoskeletal by history.  Unable to produce on exam but does seem to be worse when  she tries to sit up or turn.  She does have some abdominal symptoms which seem more likely related to her taking a magnesium product and causing her to have diarrhea.  Regardless we will obtain CT imaging to assess for intra-abdominal or new intraspinal pathology.  UA blood work.  Reassess.  UA negative for infection, LFTs lipase unremarkable.  No leukocytosis no significant anemia.  CT imaging negative for intra-abdominal pathology.  CT of the L-spine without acute fracture or metastasis.  8:49 AM:  I have discussed the diagnosis/risks/treatment options with the patient.  Evaluation and diagnostic testing in the emergency department does not suggest an emergent  condition requiring admission or immediate intervention beyond what has been performed at this time.  They will follow up with PCP. We also discussed returning to the ED immediately if new or worsening sx occur. We discussed the sx which are most concerning (e.g., sudden worsening pain, fever, inability to tolerate by mouth, cauda equina s/sx) that necessitate immediate return. Medications administered to the patient during their visit and any new prescriptions provided to the patient are listed below.  Medications given during this visit Medications  oxyCODONE (Oxy IR/ROXICODONE) immediate release tablet 2.5 mg (2.5 mg Oral Not Given 07/19/23 0832)  sodium chloride 0.9 % bolus 1,000 mL (1,000 mLs Intravenous New Bag/Given 07/19/23 1324)  acetaminophen (TYLENOL) tablet 1,000 mg (1,000 mg Oral Given 07/19/23 0830)  ketorolac (TORADOL) 15 MG/ML injection 15 mg (15 mg Intravenous Given 07/19/23 4010)     The patient appears reasonably screen and/or stabilized for discharge and I doubt any other medical condition or other [facility] requiring further screening, evaluation, or treatment in the ED at this time prior to discharge.          Final Clinical Impression(s) / ED Diagnoses Final diagnoses:  Acute right-sided low back pain without sciatica     Rx / DC Orders ED Discharge Orders     None         Melene Plan, DO 07/19/23 [phone]

## 2023-07-19 NOTE — ED Notes (Signed)
ED Provider at bedside. 

## 2023-07-19 NOTE — ED Notes (Signed)
Unsuccessful IV attempt, LAC, pt tolerated well

## 2023-07-19 NOTE — ED Notes (Signed)
Pt discharged to home using teachback Method. Discharge instructions have been discussed with patient and/or family members. Pt verbally acknowledges understanding d/c instructions, has been given opportunity for questions to be answered, and endorses comprehension to checkout at registration before leaving. Pt is receiving IV fluids, d/c will be complete when IV fluids finish

## 2023-08-01 DIAGNOSIS — M9903 Segmental and somatic dysfunction of lumbar region: Secondary | ICD-10-CM | POA: Diagnosis not present

## 2023-08-07 ENCOUNTER — Ambulatory Visit (INDEPENDENT_AMBULATORY_CARE_PROVIDER_SITE_OTHER): Payer: Medicare Other | Admitting: Family Medicine

## 2023-08-21 DIAGNOSIS — H9203 Otalgia, bilateral: Secondary | ICD-10-CM | POA: Diagnosis not present

## 2023-08-21 DIAGNOSIS — J3489 Other specified disorders of nose and nasal sinuses: Secondary | ICD-10-CM | POA: Diagnosis not present

## 2023-08-21 DIAGNOSIS — M26623 Arthralgia of bilateral temporomandibular joint: Secondary | ICD-10-CM | POA: Diagnosis not present

## 2023-08-26 DIAGNOSIS — M9903 Segmental and somatic dysfunction of lumbar region: Secondary | ICD-10-CM | POA: Diagnosis not present

## 2023-09-02 DIAGNOSIS — M9903 Segmental and somatic dysfunction of lumbar region: Secondary | ICD-10-CM | POA: Diagnosis not present

## 2023-09-09 DIAGNOSIS — M9903 Segmental and somatic dysfunction of lumbar region: Secondary | ICD-10-CM | POA: Diagnosis not present

## 2023-09-11 ENCOUNTER — Ambulatory Visit (INDEPENDENT_AMBULATORY_CARE_PROVIDER_SITE_OTHER): Payer: Medicare Other | Admitting: Family Medicine

## 2023-09-23 DIAGNOSIS — M9903 Segmental and somatic dysfunction of lumbar region: Secondary | ICD-10-CM | POA: Diagnosis not present

## 2023-10-03 ENCOUNTER — Ambulatory Visit (INDEPENDENT_AMBULATORY_CARE_PROVIDER_SITE_OTHER): Payer: Medicare Other | Admitting: Family Medicine

## 2023-10-03 ENCOUNTER — Encounter (INDEPENDENT_AMBULATORY_CARE_PROVIDER_SITE_OTHER): Payer: Self-pay | Admitting: Family Medicine

## 2023-10-03 VITALS — HR 83 | Temp 97.7°F | Ht 62.0 in | Wt 181.0 lb

## 2023-10-03 DIAGNOSIS — E669 Obesity, unspecified: Secondary | ICD-10-CM | POA: Diagnosis not present

## 2023-10-03 DIAGNOSIS — Z6833 Body mass index (BMI) 33.0-33.9, adult: Secondary | ICD-10-CM | POA: Diagnosis not present

## 2023-10-03 DIAGNOSIS — F439 Reaction to severe stress, unspecified: Secondary | ICD-10-CM | POA: Diagnosis not present

## 2023-10-03 NOTE — Progress Notes (Signed)
.smr  Office: [phone]  /  Fax: [phone]  WEIGHT SUMMARY AND BIOMETRICS  Anthropometric Measurements Height: 5\' 2"  (1.575 m) Weight: 181 lb (82.1 kg) BMI (Calculated): 33.1 Weight at Last Visit: 179 lb Weight Lost Since Last Visit: 0 Weight Gained Since Last Visit: 2 lb Total Weight Loss (lbs): 23 lb (10.4 kg)   Body Composition  Body Fat %: 43.7 % Fat Mass (lbs): 79 lbs Muscle Mass (lbs): 96.8 lbs Total Body Water (lbs): 70.6 lbs Visceral Fat Rating : 13   Other Clinical Data Fasting: No Labs: No Today's Visit #: 19    Chief Complaint: OBESITY   History of Present Illness   The patient, a 72 year old with a history of obesity and high stress levels, presents for a follow-up visit after three months. She reports a significant increase in stress due to the process of moving out of her house, which has been her residence for the past 40 years. The patient acknowledges that the stress has likely impacted her weight, as she has gained two pounds over the past three months. Despite the weight gain, she reports no changes in her eating habits, focusing on portion control and smart choices.  The patient has been more physically active due to the moving process, but she expresses frustration over the lack of energy and strength to exercise regularly. She also reports physical discomfort, describing aching all over her body.  The patient's stress is further compounded by the emotional toll of sorting through personal items, including those belonging to her late husband. She expresses feelings of being overwhelmed and has considered seeking help for cortisol level management.  Despite the challenges, the patient is determined to continue her weight loss journey. She expresses a desire to reassess her situation after the holidays, hoping that her stress levels and living situation will have stabilized by then. She also expresses a need for a more detailed plan to manage her  tasks and stress levels.  The patient is currently taking several supplements for her health, but no changes in medication or new symptoms were reported during this visit. She is critical of her progress but acknowledges that she is doing the best she can under the circumstances.          PHYSICAL EXAM:  Pulse 83, temperature 97.7 F (36.5 C), height 5\' 2"  (1.575 m), weight 181 lb (82.1 kg), SpO2 98%. Body mass index is 33.11 kg/m.  DIAGNOSTIC DATA REVIEWED:  BMET    Component Value Date/Time   NA 139 07/19/2023 0807   NA 140 04/22/2023 0945   K 3.9 07/19/2023 0807   CL 103 07/19/2023 0807   CO2 27 07/19/2023 0807   GLUCOSE 126 (H) 07/19/2023 0807   BUN 14 07/19/2023 0807   BUN 10 04/22/2023 0945   CREATININE 0.62 07/19/2023 0807   CREATININE 0.69 08/11/2020 0931   CALCIUM 8.7 (L) 07/19/2023 0807   GFRNONAA >60 07/19/2023 0807   GFRNONAA 89 08/11/2020 0931   GFRAA 103 08/11/2020 0931   Lab Results  Component Value Date   HGBA1C 5.5 04/22/2023   HGBA1C (H) 05/07/2010    5.9 (NOTE)  According to the ADA Clinical Practice Recommendations for 2011, when HbA1c is used as a screening test:   >=6.5%   Diagnostic of Diabetes Mellitus           (if abnormal result  is confirmed)  5.7-6.4%   Increased risk of developing Diabetes Mellitus  References:Diagnosis and Classification of Diabetes Mellitus,Diabetes Care,2011,34(Suppl 1):S62-S69 and Standards of Medical Care in         Diabetes - 2011,Diabetes Care,2011,34  (Suppl 1):S11-S61.   Lab Results  Component Value Date   INSULIN 12.3 04/22/2023   INSULIN 20.0 03/02/2022   Lab Results  Component Value Date   TSH 4.180 07/24/2022   CBC    Component Value Date/Time   WBC 9.9 07/19/2023 0807   RBC 5.04 07/19/2023 0807   HGB 14.7 07/19/2023 0807   HGB 14.3 07/24/2022 0823   HCT 43.0 07/19/2023 0807   HCT 43.5 07/24/2022 0823   PLT 253 07/19/2023 0807    PLT 277 07/24/2022 0823   MCV 85.3 07/19/2023 0807   MCV 87 07/24/2022 0823   MCH 29.2 07/19/2023 0807   MCHC 34.2 07/19/2023 0807   RDW 12.3 07/19/2023 0807   RDW 12.0 07/24/2022 0823   Iron Studies    Component Value Date/Time   IRON 93 04/17/2017 1150   FERRITIN 45.1 04/03/2016 1352   IRONPCTSAT 27.3 04/17/2017 1150   Lipid Panel     Component Value Date/Time   CHOL 213 (H) 04/22/2023 0945   TRIG 96 04/22/2023 0945   TRIG 181 (H) 07/21/2014 0840   HDL 69 04/22/2023 0945   HDL 48 07/21/2014 0840   CHOLHDL 3.4 08/11/2020 0931   LDLCALC 127 (H) 04/22/2023 0945   LDLCALC 113 (H) 08/11/2020 0931   LDLCALC 128 (H) 07/21/2014 0840   Hepatic Function Panel     Component Value Date/Time   PROT 6.8 07/19/2023 0807   PROT 6.7 04/22/2023 0945   ALBUMIN 4.4 07/19/2023 0807   ALBUMIN 4.5 04/22/2023 0945   AST 20 07/19/2023 0807   ALT 17 07/19/2023 0807   ALKPHOS 49 07/19/2023 0807   BILITOT 0.5 07/19/2023 0807   BILITOT 0.5 04/22/2023 0945   BILIDIR 0.08 07/21/2014 0840   IBILI 0.5 05/06/2010 2010      Component Value Date/Time   TSH 4.180 07/24/2022 0823   Nutritional Lab Results  Component Value Date   VD25OH 66.7 04/22/2023   VD25OH 65.2 07/24/2022   VD25OH 88.4 03/02/2022     Assessment and Plan    Obesity Recent weight gain of 2 pounds over three months, likely due to stress from moving and lack of a formal eating plan. Increased physical activity noted. Stress hormones may be affecting metabolism and promoting fat storage. Emphasized importance of current efforts and reassessment post-holidays. - Continue dietary efforts focusing on portion control and healthy choices - Reassess weight and dietary plan in January - Encourage stress management techniques such as breathing exercises - Avoid purchasing a scale; focus on fit of clothes and overall well-being  Stress High stress levels due to recent move and emotional burden. Stress impacting well-being and  contributing to weight gain. Discussed breaking tasks into smaller steps and importance of self-care. - Continue stress management techniques such as breathing exercises - Encourage making a checklist of small tasks - Follow-up visit in January to reassess stress levels and overall health  General Health Maintenance Routine health maintenance discussed. Last labs in August; due for updated labs in January. Difficulty with blood draws noted,  requiring a specialized lab. - Schedule fasting labs for January - Provide map and instructions for specialized lab for blood draw  Follow-up - Schedule follow-up appointment for January - Update labs during January visit.        She was informed of the importance of frequent follow up visits to maximize her success with intensive lifestyle modifications for her multiple health conditions.    Quillian Quince, MD

## 2023-10-07 DIAGNOSIS — M9903 Segmental and somatic dysfunction of lumbar region: Secondary | ICD-10-CM | POA: Diagnosis not present

## 2023-10-16 DIAGNOSIS — M9903 Segmental and somatic dysfunction of lumbar region: Secondary | ICD-10-CM | POA: Diagnosis not present

## 2023-11-04 DIAGNOSIS — M9903 Segmental and somatic dysfunction of lumbar region: Secondary | ICD-10-CM | POA: Diagnosis not present

## 2023-11-11 DIAGNOSIS — M9903 Segmental and somatic dysfunction of lumbar region: Secondary | ICD-10-CM | POA: Diagnosis not present

## 2023-11-26 DIAGNOSIS — M9903 Segmental and somatic dysfunction of lumbar region: Secondary | ICD-10-CM | POA: Diagnosis not present

## 2023-12-12 ENCOUNTER — Encounter (INDEPENDENT_AMBULATORY_CARE_PROVIDER_SITE_OTHER): Payer: Self-pay | Admitting: Family Medicine

## 2023-12-12 ENCOUNTER — Ambulatory Visit (INDEPENDENT_AMBULATORY_CARE_PROVIDER_SITE_OTHER): Payer: Medicare Other | Admitting: Family Medicine

## 2023-12-12 VITALS — HR 85 | Temp 98.2°F | Ht 62.0 in | Wt 185.0 lb

## 2023-12-12 DIAGNOSIS — E669 Obesity, unspecified: Secondary | ICD-10-CM | POA: Diagnosis not present

## 2023-12-12 DIAGNOSIS — E559 Vitamin D deficiency, unspecified: Secondary | ICD-10-CM

## 2023-12-12 DIAGNOSIS — R7303 Prediabetes: Secondary | ICD-10-CM | POA: Diagnosis not present

## 2023-12-12 DIAGNOSIS — Z6833 Body mass index (BMI) 33.0-33.9, adult: Secondary | ICD-10-CM

## 2023-12-12 DIAGNOSIS — M9903 Segmental and somatic dysfunction of lumbar region: Secondary | ICD-10-CM | POA: Diagnosis not present
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
## 2023-12-18 DIAGNOSIS — H5203 Hypermetropia, bilateral: Secondary | ICD-10-CM | POA: Diagnosis not present

## 2023-12-25 DIAGNOSIS — E559 Vitamin D deficiency, unspecified: Secondary | ICD-10-CM | POA: Diagnosis not present

## 2023-12-25 DIAGNOSIS — R7303 Prediabetes: Secondary | ICD-10-CM | POA: Diagnosis not present

## 2023-12-26 ENCOUNTER — Encounter (INDEPENDENT_AMBULATORY_CARE_PROVIDER_SITE_OTHER): Payer: Self-pay | Admitting: Family Medicine

## 2023-12-26 ENCOUNTER — Telehealth (INDEPENDENT_AMBULATORY_CARE_PROVIDER_SITE_OTHER): Payer: Self-pay | Admitting: Family Medicine

## 2023-12-26 DIAGNOSIS — M9903 Segmental and somatic dysfunction of lumbar region: Secondary | ICD-10-CM | POA: Diagnosis not present

## 2023-12-26 NOTE — Telephone Encounter (Signed)
 Patient received lab results from labs done on 02/05 and is concerned about one of the numbers. She requested to speak with her provider about the results and would like to do so before her appointment on 03/10 if possible. Please call back using number on file.

## 2023-12-27 LAB — VITAMIN B12: Vitamin B-12: 878 pg/mL (ref 232–1245)

## 2023-12-27 LAB — CMP14+EGFR
ALT: 17 [IU]/L (ref 0–32)
AST: 27 [IU]/L (ref 0–40)
Albumin: 4.6 g/dL (ref 3.8–4.8)
Alkaline Phosphatase: 60 [IU]/L (ref 44–121)
BUN/Creatinine Ratio: 16 (ref 12–28)
BUN: 11 mg/dL (ref 8–27)
Bilirubin Total: 0.5 mg/dL (ref 0.0–1.2)
CO2: 16 mmol/L — ABNORMAL LOW (ref 20–29)
Calcium: 9.2 mg/dL (ref 8.7–10.3)
Chloride: 104 mmol/L (ref 96–106)
Creatinine, Ser: 0.68 mg/dL (ref 0.57–1.00)
Globulin, Total: 1.8 g/dL (ref 1.5–4.5)
Glucose: 107 mg/dL — ABNORMAL HIGH (ref 70–99)
Potassium: 4.8 mmol/L (ref 3.5–5.2)
Sodium: 141 mmol/L (ref 134–144)
Total Protein: 6.4 g/dL (ref 6.0–8.5)
eGFR: 92 mL/min/{1.73_m2} (ref >59)

## 2023-12-27 LAB — LIPID PANEL WITH LDL/HDL RATIO
Cholesterol, Total: 209 mg/dL — ABNORMAL HIGH (ref 100–199)
HDL: 65 mg/dL (ref >39)
LDL Chol Calc (NIH): 121 mg/dL — ABNORMAL HIGH (ref 0–99)
LDL/HDL Ratio: 1.9 {ratio} (ref 0.0–3.2)
Triglycerides: 129 mg/dL (ref 0–149)
VLDL Cholesterol Cal: 23 mg/dL (ref 5–40)

## 2023-12-27 LAB — INSULIN, RANDOM: INSULIN: 12.4 u[IU]/mL (ref 2.6–24.9)

## 2023-12-27 LAB — HEMOGLOBIN A1C
Est. average glucose Bld gHb Est-mCnc: 120 mg/dL
Hgb A1c MFr Bld: 5.8 % — ABNORMAL HIGH (ref 4.8–5.6)

## 2023-12-27 LAB — VITAMIN D 25 HYDROXY (VIT D DEFICIENCY, FRACTURES): Vit D, 25-Hydroxy: 59.6 ng/mL (ref 30.0–100.0)

## 2024-01-09 NOTE — Telephone Encounter (Signed)
 Labs reviewed, shows worsening HgA1c. This requires a visit to discuss in depth, please schedule her an earlier appointment to discuss.

## 2024-01-16 DIAGNOSIS — M9903 Segmental and somatic dysfunction of lumbar region: Secondary | ICD-10-CM | POA: Diagnosis not present

## 2024-01-23 DIAGNOSIS — K080 Exfoliation of teeth due to systemic causes: Secondary | ICD-10-CM | POA: Diagnosis not present

## 2024-01-27 ENCOUNTER — Encounter (INDEPENDENT_AMBULATORY_CARE_PROVIDER_SITE_OTHER): Payer: Self-pay | Admitting: Family Medicine

## 2024-01-27 ENCOUNTER — Ambulatory Visit (INDEPENDENT_AMBULATORY_CARE_PROVIDER_SITE_OTHER): Payer: Medicare Other | Admitting: Family Medicine

## 2024-01-27 VITALS — BP 203/83 | HR 96 | Temp 97.8°F | Ht 62.0 in | Wt 187.0 lb

## 2024-01-27 DIAGNOSIS — E669 Obesity, unspecified: Secondary | ICD-10-CM

## 2024-01-27 DIAGNOSIS — F439 Reaction to severe stress, unspecified: Secondary | ICD-10-CM

## 2024-01-27 DIAGNOSIS — R030 Elevated blood-pressure reading, without diagnosis of hypertension: Secondary | ICD-10-CM

## 2024-01-27 DIAGNOSIS — R7303 Prediabetes: Secondary | ICD-10-CM | POA: Diagnosis not present

## 2024-01-27 DIAGNOSIS — Z6834 Body mass index (BMI) 34.0-34.9, adult: Secondary | ICD-10-CM

## 2024-01-27 NOTE — Progress Notes (Signed)
 Office: [phone]  /  Fax: [phone]  WEIGHT SUMMARY AND BIOMETRICS  Anthropometric Measurements Height: 5\' 2"  (1.575 m) Weight: 187 lb (84.8 kg) BMI (Calculated): 34.19 Weight at Last Visit: 185 lb Weight Lost Since Last Visit: 0 Weight Gained Since Last Visit: 2 lb Starting Weight: 204 lb Total Weight Loss (lbs): 17 lb (7.711 kg)   Body Composition  Body Fat %: 44.3 % Fat Mass (lbs): 83 lbs Muscle Mass (lbs): 99 lbs Total Body Water (lbs): 71.2 lbs Visceral Fat Rating : 14   Other Clinical Data Fasting: no Labs: no Today's Visit #: 21 Starting Date: 03/02/22    Chief Complaint: OBESITY    History of Present Illness   The patient presents for obesity treatment plan discussion and progress monitoring.  She has been adhering to a diet plan focusing on portion control and healthier food choices, maintaining a gluten and dairy-free diet 95% of the time. Despite these efforts, she has gained two pounds in the last six weeks and is up 14 pounds from her lowest weight of 173 pounds almost a year ago. She is attempting to transition back into exercise, having joined a local gym and started walking with Nordic poles three to five times a week. She plans to work with a trainer once a week and has signed up for a stretching program to aid her exercise routine.  She has a history of prediabetes and is not currently on any medications for this condition. She is managing it through diet and exercise, paying more attention to her protein intake and continuing with stretching exercises recommended by her physical therapist.  She reports a history of white coat syndrome without hypertension and is not on any antihypertensive medications. Her blood pressure today was initially 203/83 mmHg, which decreased to 172/80 mmHg on repeat measurement. She has not been regularly checking her blood pressure at home but recalls a recent reading of 140/80 mmHg at another doctor's office.  She  mentions feeling stressed due to recent life changes, including a move, and is trying to manage this stress. She is taking a supplement called stress care to help with adrenal gland and cortisol regulation.          PHYSICAL EXAM:  Blood pressure (!) 203/83, pulse 96, temperature 97.8 F (36.6 C), height 5\' 2"  (1.575 m), weight 187 lb (84.8 kg), SpO2 99%. Body mass index is 34.2 kg/m.  DIAGNOSTIC DATA REVIEWED:  BMET    Component Value Date/Time   NA 141 12/25/2023 0952   K 4.8 12/25/2023 0952   CL 104 12/25/2023 0952   CO2 16 (L) 12/25/2023 0952   GLUCOSE 107 (H) 12/25/2023 0952   GLUCOSE 126 (H) 07/19/2023 0807   BUN 11 12/25/2023 0952   CREATININE 0.68 12/25/2023 0952   CREATININE 0.69 08/11/2020 0931   CALCIUM 9.2 12/25/2023 0952   GFRNONAA >60 07/19/2023 0807   GFRNONAA 89 08/11/2020 0931   GFRAA 103 08/11/2020 0931   Lab Results  Component Value Date   HGBA1C 5.8 (H) 12/25/2023   HGBA1C (H) 05/07/2010    5.9 (NOTE)  According to the ADA Clinical Practice Recommendations for 2011, when HbA1c is used as a screening test:   >=6.5%   Diagnostic of Diabetes Mellitus           (if abnormal result  is confirmed)  5.7-6.4%   Increased risk of developing Diabetes Mellitus  References:Diagnosis and Classification of Diabetes Mellitus,Diabetes Care,2011,34(Suppl 1):S62-S69 and Standards of Medical Care in         Diabetes - 2011,Diabetes Care,2011,34  (Suppl 1):S11-S61.   Lab Results  Component Value Date   INSULIN 12.4 12/25/2023   INSULIN 20.0 03/02/2022   Lab Results  Component Value Date   TSH 4.180 07/24/2022   CBC    Component Value Date/Time   WBC 9.9 07/19/2023 0807   RBC 5.04 07/19/2023 0807   HGB 14.7 07/19/2023 0807   HGB 14.3 07/24/2022 0823   HCT 43.0 07/19/2023 0807   HCT 43.5 07/24/2022 0823   PLT 253 07/19/2023 0807   PLT 277 07/24/2022 0823   MCV 85.3 07/19/2023 0807   MCV 87  07/24/2022 0823   MCH 29.2 07/19/2023 0807   MCHC 34.2 07/19/2023 0807   RDW 12.3 07/19/2023 0807   RDW 12.0 07/24/2022 0823   Iron Studies    Component Value Date/Time   IRON 93 04/17/2017 1150   FERRITIN 45.1 04/03/2016 1352   IRONPCTSAT 27.3 04/17/2017 1150   Lipid Panel     Component Value Date/Time   CHOL 209 (H) 12/25/2023 0952   TRIG 129 12/25/2023 0952   TRIG 181 (H) 07/21/2014 0840   HDL 65 12/25/2023 0952   HDL 48 07/21/2014 0840   CHOLHDL 3.4 08/11/2020 0931   LDLCALC 121 (H) 12/25/2023 0952   LDLCALC 113 (H) 08/11/2020 0931   LDLCALC 128 (H) 07/21/2014 0840   Hepatic Function Panel     Component Value Date/Time   PROT 6.4 12/25/2023 0952   ALBUMIN 4.6 12/25/2023 0952   AST 27 12/25/2023 0952   ALT 17 12/25/2023 0952   ALKPHOS 60 12/25/2023 0952   BILITOT 0.5 12/25/2023 0952   BILIDIR 0.08 07/21/2014 0840   IBILI 0.5 05/06/2010 2010      Component Value Date/Time   TSH 4.180 07/24/2022 0823   Nutritional Lab Results  Component Value Date   VD25OH 59.6 12/25/2023   VD25OH 66.7 04/22/2023   VD25OH 65.2 07/24/2022     Assessment and Plan    Obesity Patient has gained two pounds in the last six weeks, now 14 pounds above her lowest weight of 173 pounds a year ago. She adheres to a gluten and dairy-free diet 95% of the time and practices portion control. Muscle mass improvement noted, possibly contributing to weight gain. She has joined a gym, started walking with Nordic poles, and plans to work with a [employer]. Discussed the importance of a balanced diet and regular exercise to prevent further weight gain. - Continue portion control and healthy food choices - Increase exercise frequency and consistency - Work with a trainer once a week - Consider using Chat[employer] for new healthy recipes  Prediabetes Managed with diet and exercise, no current medications. Emphasized the importance of regular blood glucose monitoring to prevent progression to  diabetes. - Continue current diet and exercise regimen - Monitor blood glucose levels regularly  Stress Patient reports stress due to recent life changes, including a move. Prescribed 'Stress Care' supplement to manage stress and its impact on adrenal glands and cortisol levels. Discussed stress management techniques and self-care practices to support  weight loss efforts. - Continue taking 'Stress Care' supplement - Implement stress reduction techniques and self-care practices  White Coat Syndrome Elevated blood pressure readings today (203/83 mmHg initially, 172/80 mmHg on repeat). Previous readings at home and other settings have been lower. Discussed the importance of regular home blood pressure monitoring to manage potential hypertension. - Monitor blood pressure at home once a week - Ensure accurate home blood pressure monitoring   Follow-up - Follow up in six weeks on 03/09/2024 at 11 AM - Schedule an additional follow-up appointment in May.         I have personally spent 42 minutes total time today in preparation, patient care, and documentation for this visit, including the following: review of clinical lab tests; review of medical tests/procedures/services.    She was informed of the importance of frequent follow up visits to maximize her success with intensive lifestyle modifications for her multiple health conditions.    Quillian Quince, MD

## 2024-01-29 DIAGNOSIS — K080 Exfoliation of teeth due to systemic causes: Secondary | ICD-10-CM | POA: Diagnosis not present

## 2024-02-10 DIAGNOSIS — M9903 Segmental and somatic dysfunction of lumbar region: Secondary | ICD-10-CM | POA: Diagnosis not present

## 2024-03-02 DIAGNOSIS — M9903 Segmental and somatic dysfunction of lumbar region: Secondary | ICD-10-CM | POA: Diagnosis not present

## 2024-03-09 ENCOUNTER — Ambulatory Visit (INDEPENDENT_AMBULATORY_CARE_PROVIDER_SITE_OTHER): Payer: Medicare Other | Admitting: Family Medicine

## 2024-03-23 DIAGNOSIS — M9903 Segmental and somatic dysfunction of lumbar region: Secondary | ICD-10-CM | POA: Diagnosis not present

## 2024-03-31 DIAGNOSIS — M9903 Segmental and somatic dysfunction of lumbar region: Secondary | ICD-10-CM | POA: Diagnosis not present

## 2024-04-07 DIAGNOSIS — M9903 Segmental and somatic dysfunction of lumbar region: Secondary | ICD-10-CM | POA: Diagnosis not present

## 2024-04-14 DIAGNOSIS — M9903 Segmental and somatic dysfunction of lumbar region: Secondary | ICD-10-CM | POA: Diagnosis not present

## 2024-04-20 ENCOUNTER — Ambulatory Visit (INDEPENDENT_AMBULATORY_CARE_PROVIDER_SITE_OTHER): Admitting: Family Medicine

## 2024-04-23 DIAGNOSIS — M9903 Segmental and somatic dysfunction of lumbar region: Secondary | ICD-10-CM | POA: Diagnosis not present

## 2024-05-05 DIAGNOSIS — M9903 Segmental and somatic dysfunction of lumbar region: Secondary | ICD-10-CM | POA: Diagnosis not present

## 2024-05-25 DIAGNOSIS — M9903 Segmental and somatic dysfunction of lumbar region: Secondary | ICD-10-CM | POA: Diagnosis not present

## 2024-06-08 DIAGNOSIS — M9903 Segmental and somatic dysfunction of lumbar region: Secondary | ICD-10-CM | POA: Diagnosis not present

## 2024-06-22 DIAGNOSIS — M9903 Segmental and somatic dysfunction of lumbar region: Secondary | ICD-10-CM | POA: Diagnosis not present
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
## 2024-06-29 ENCOUNTER — Ambulatory Visit (INDEPENDENT_AMBULATORY_CARE_PROVIDER_SITE_OTHER)

## 2024-06-29 ENCOUNTER — Ambulatory Visit: Admitting: Sports Medicine

## 2024-06-29 VITALS — BP 134/82 | HR 89 | Ht 62.0 in | Wt 189.0 lb

## 2024-06-29 DIAGNOSIS — M542 Cervicalgia: Secondary | ICD-10-CM

## 2024-06-29 DIAGNOSIS — M533 Sacrococcygeal disorders, not elsewhere classified: Secondary | ICD-10-CM

## 2024-06-29 DIAGNOSIS — G8929 Other chronic pain: Secondary | ICD-10-CM | POA: Diagnosis not present

## 2024-06-29 DIAGNOSIS — M545 Low back pain, unspecified: Secondary | ICD-10-CM

## 2024-06-29 MED ORDER — CELECOXIB 200 MG PO CAPS: 200.0000 mg | ORAL_CAPSULE | Freq: Two times a day (BID) | ORAL | 0 refills | Status: DC

## 2024-06-29 NOTE — Patient Instructions (Signed)
 Continue HEP   Celebrex  200 mg 2x daily x2 weeks.  If still having pain after 2 weeks, complete 3rd-week of NSAID. May use remaining NSAID as needed once daily for pain control.  Do not to use additional over-the-counter NSAIDs (ibuprofen, naproxen, Advil, Aleve, etc.) while taking prescription NSAIDs.  May use Tylenol  [phone] mg 2 to 3 times a day for breakthrough pain.  4 week follow up

## 2024-07-13 ENCOUNTER — Ambulatory Visit: Payer: Self-pay | Admitting: Sports Medicine

## 2024-07-13 DIAGNOSIS — M9903 Segmental and somatic dysfunction of lumbar region: Secondary | ICD-10-CM | POA: Diagnosis not present
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
## 2024-07-26 ENCOUNTER — Other Ambulatory Visit: Payer: Self-pay | Admitting: Sports Medicine

## 2024-07-27 ENCOUNTER — Ambulatory Visit (INDEPENDENT_AMBULATORY_CARE_PROVIDER_SITE_OTHER): Admitting: Sports Medicine

## 2024-07-27 VITALS — HR 65 | Ht 62.0 in | Wt 189.0 lb

## 2024-07-27 DIAGNOSIS — M545 Low back pain, unspecified: Secondary | ICD-10-CM | POA: Diagnosis not present

## 2024-07-27 DIAGNOSIS — G8929 Other chronic pain: Secondary | ICD-10-CM | POA: Diagnosis not present

## 2024-07-27 DIAGNOSIS — M542 Cervicalgia: Secondary | ICD-10-CM

## 2024-07-27 DIAGNOSIS — M533 Sacrococcygeal disorders, not elsewhere classified: Secondary | ICD-10-CM

## 2024-07-27 NOTE — Patient Instructions (Signed)
 Tylenol  [phone] mg nightly before bed  PT referral   6 week follow up

## 2024-08-03 DIAGNOSIS — M9903 Segmental and somatic dysfunction of lumbar region: Secondary | ICD-10-CM | POA: Diagnosis not present

## 2024-08-04 DIAGNOSIS — K080 Exfoliation of teeth due to systemic causes: Secondary | ICD-10-CM | POA: Diagnosis not present

## 2024-08-05 ENCOUNTER — Ambulatory Visit: Admitting: Physical Therapy

## 2024-08-05 ENCOUNTER — Encounter: Payer: Self-pay | Admitting: Physical Therapy

## 2024-08-05 DIAGNOSIS — M542 Cervicalgia: Secondary | ICD-10-CM

## 2024-08-05 DIAGNOSIS — M5459 Other low back pain: Secondary | ICD-10-CM | POA: Diagnosis not present
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
## 2024-08-06 ENCOUNTER — Ambulatory Visit: Admitting: Physical Therapy

## 2024-08-06 DIAGNOSIS — M5459 Other low back pain: Secondary | ICD-10-CM | POA: Diagnosis not present

## 2024-08-06 DIAGNOSIS — M542 Cervicalgia: Secondary | ICD-10-CM

## 2024-08-09 ENCOUNTER — Encounter: Payer: Self-pay | Admitting: Physical Therapy
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
## 2024-08-12 ENCOUNTER — Encounter: Admitting: Physical Therapy

## 2024-08-19 ENCOUNTER — Ambulatory Visit: Admitting: Physical Therapy

## 2024-08-19 ENCOUNTER — Encounter: Payer: Self-pay | Admitting: Physical Therapy

## 2024-08-19 DIAGNOSIS — M542 Cervicalgia: Secondary | ICD-10-CM | POA: Diagnosis not present

## 2024-08-19 DIAGNOSIS — M5459 Other low back pain: Secondary | ICD-10-CM
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
## 2024-08-21 ENCOUNTER — Encounter: Payer: Self-pay | Admitting: Physical Therapy

## 2024-08-21 ENCOUNTER — Ambulatory Visit: Admitting: Physical Therapy

## 2024-08-21 DIAGNOSIS — M542 Cervicalgia: Secondary | ICD-10-CM | POA: Diagnosis not present

## 2024-08-21 DIAGNOSIS — M5459 Other low back pain: Secondary | ICD-10-CM | POA: Diagnosis not present
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
## 2024-08-24 DIAGNOSIS — M9903 Segmental and somatic dysfunction of lumbar region: Secondary | ICD-10-CM | POA: Diagnosis not present

## 2024-08-25 ENCOUNTER — Ambulatory Visit: Admitting: Physical Therapy

## 2024-08-25 ENCOUNTER — Encounter: Payer: Self-pay | Admitting: Physical Therapy

## 2024-08-25 DIAGNOSIS — M542 Cervicalgia: Secondary | ICD-10-CM | POA: Diagnosis not present

## 2024-08-25 DIAGNOSIS — M5459 Other low back pain: Secondary | ICD-10-CM
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
## 2024-08-27 ENCOUNTER — Ambulatory Visit: Admitting: Physical Therapy

## 2024-08-27 ENCOUNTER — Encounter: Payer: Self-pay | Admitting: Physical Therapy

## 2024-08-27 DIAGNOSIS — M5459 Other low back pain: Secondary | ICD-10-CM

## 2024-08-27 DIAGNOSIS — M542 Cervicalgia: Secondary | ICD-10-CM | POA: Diagnosis not present
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
## 2024-08-31 ENCOUNTER — Ambulatory Visit (INDEPENDENT_AMBULATORY_CARE_PROVIDER_SITE_OTHER): Admitting: Physical Therapy

## 2024-08-31 ENCOUNTER — Encounter: Payer: Self-pay | Admitting: Physical Therapy

## 2024-08-31 DIAGNOSIS — M5459 Other low back pain: Secondary | ICD-10-CM

## 2024-08-31 DIAGNOSIS — M542 Cervicalgia: Secondary | ICD-10-CM | POA: Diagnosis not present
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
## 2024-09-01 ENCOUNTER — Encounter: Admitting: Physical Therapy

## 2024-09-01 DIAGNOSIS — K080 Exfoliation of teeth due to systemic causes: Secondary | ICD-10-CM | POA: Diagnosis not present

## 2024-09-03 ENCOUNTER — Encounter: Payer: Self-pay | Admitting: Physical Therapy

## 2024-09-03 ENCOUNTER — Ambulatory Visit: Admitting: Physical Therapy

## 2024-09-03 DIAGNOSIS — M542 Cervicalgia: Secondary | ICD-10-CM

## 2024-09-03 DIAGNOSIS — M5459 Other low back pain: Secondary | ICD-10-CM
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
## 2024-09-07 ENCOUNTER — Ambulatory Visit: Admitting: Sports Medicine

## 2024-09-07 VITALS — BP 132/82 | HR 88 | Ht 62.0 in | Wt 189.0 lb

## 2024-09-07 DIAGNOSIS — M545 Low back pain, unspecified: Secondary | ICD-10-CM

## 2024-09-07 DIAGNOSIS — M533 Sacrococcygeal disorders, not elsewhere classified: Secondary | ICD-10-CM | POA: Diagnosis not present

## 2024-09-07 DIAGNOSIS — M542 Cervicalgia: Secondary | ICD-10-CM

## 2024-09-07 DIAGNOSIS — G8929 Other chronic pain: Secondary | ICD-10-CM

## 2024-09-07 NOTE — Patient Instructions (Signed)
-   Use Celebrex  200 mg daily as needed for breakthrough pain.  Recommend limiting chronic NSAIDs to 1-2 doses per week to prevent long-term side effects. Use Tylenol  500 to 1000 mg tablets 2-3 times a day as needed for day-to-day pain relief.    Can call and ask for a refill   Continue PT and HEP   As needed follow up

## 2024-09-08 ENCOUNTER — Encounter: Payer: Self-pay | Admitting: Physical Therapy

## 2024-09-08 ENCOUNTER — Ambulatory Visit: Admitting: Physical Therapy

## 2024-09-08 DIAGNOSIS — M5459 Other low back pain: Secondary | ICD-10-CM

## 2024-09-08 DIAGNOSIS — M542 Cervicalgia: Secondary | ICD-10-CM
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
## 2024-09-08 NOTE — Patient Instructions (Signed)

## 2024-09-10 ENCOUNTER — Ambulatory Visit: Admitting: Physical Therapy

## 2024-09-10 ENCOUNTER — Encounter: Payer: Self-pay | Admitting: Physical Therapy

## 2024-09-10 DIAGNOSIS — M542 Cervicalgia: Secondary | ICD-10-CM | POA: Diagnosis not present

## 2024-09-10 DIAGNOSIS — M5459 Other low back pain: Secondary | ICD-10-CM | POA: Diagnosis not present
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
## 2024-09-14 DIAGNOSIS — M9903 Segmental and somatic dysfunction of lumbar region: Secondary | ICD-10-CM | POA: Diagnosis not present

## 2024-09-16 ENCOUNTER — Encounter: Payer: Self-pay | Admitting: Physical Therapy

## 2024-09-16 ENCOUNTER — Ambulatory Visit: Admitting: Physical Therapy

## 2024-09-16 DIAGNOSIS — M542 Cervicalgia: Secondary | ICD-10-CM | POA: Diagnosis not present

## 2024-09-16 DIAGNOSIS — M5459 Other low back pain: Secondary | ICD-10-CM
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
## 2024-09-18 ENCOUNTER — Encounter: Payer: Self-pay | Admitting: Physical Therapy

## 2024-09-18 ENCOUNTER — Ambulatory Visit: Admitting: Physical Therapy

## 2024-09-18 DIAGNOSIS — M5459 Other low back pain: Secondary | ICD-10-CM | POA: Diagnosis not present

## 2024-09-18 DIAGNOSIS — M542 Cervicalgia: Secondary | ICD-10-CM
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
## 2024-09-22 ENCOUNTER — Encounter: Payer: Self-pay | Admitting: Physical Therapy

## 2024-09-22 ENCOUNTER — Ambulatory Visit: Admitting: Physical Therapy

## 2024-09-22 DIAGNOSIS — M5459 Other low back pain: Secondary | ICD-10-CM | POA: Diagnosis not present

## 2024-09-22 DIAGNOSIS — M542 Cervicalgia: Secondary | ICD-10-CM
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
## 2024-09-24 ENCOUNTER — Encounter: Payer: Self-pay | Admitting: Physical Therapy

## 2024-09-24 ENCOUNTER — Ambulatory Visit: Admitting: Physical Therapy

## 2024-09-24 DIAGNOSIS — M542 Cervicalgia: Secondary | ICD-10-CM

## 2024-09-24 DIAGNOSIS — M5459 Other low back pain: Secondary | ICD-10-CM | POA: Diagnosis not present
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
## 2024-09-29 ENCOUNTER — Ambulatory Visit: Admitting: Physical Therapy

## 2024-09-29 ENCOUNTER — Encounter: Payer: Self-pay | Admitting: Physical Therapy

## 2024-09-29 DIAGNOSIS — M542 Cervicalgia: Secondary | ICD-10-CM

## 2024-09-29 DIAGNOSIS — M5459 Other low back pain: Secondary | ICD-10-CM
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
## 2024-10-01 ENCOUNTER — Ambulatory Visit: Admitting: Physical Therapy

## 2024-10-01 ENCOUNTER — Encounter: Payer: Self-pay | Admitting: Physical Therapy

## 2024-10-01 DIAGNOSIS — M5459 Other low back pain: Secondary | ICD-10-CM

## 2024-10-01 DIAGNOSIS — M542 Cervicalgia: Secondary | ICD-10-CM | POA: Diagnosis not present
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
## 2024-10-06 ENCOUNTER — Encounter: Payer: Self-pay | Admitting: Physical Therapy

## 2024-10-06 ENCOUNTER — Ambulatory Visit: Admitting: Physical Therapy

## 2024-10-06 DIAGNOSIS — M542 Cervicalgia: Secondary | ICD-10-CM

## 2024-10-06 DIAGNOSIS — M5459 Other low back pain: Secondary | ICD-10-CM
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
## 2024-10-08 ENCOUNTER — Ambulatory Visit: Admitting: Physical Therapy

## 2024-10-08 ENCOUNTER — Encounter: Payer: Self-pay | Admitting: Physical Therapy

## 2024-10-08 DIAGNOSIS — M5459 Other low back pain: Secondary | ICD-10-CM

## 2024-10-08 DIAGNOSIS — M542 Cervicalgia: Secondary | ICD-10-CM | POA: Diagnosis not present
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
## 2024-10-12 ENCOUNTER — Encounter: Payer: Self-pay | Admitting: Physical Therapy

## 2024-10-12 ENCOUNTER — Ambulatory Visit: Admitting: Physical Therapy

## 2024-10-12 DIAGNOSIS — M542 Cervicalgia: Secondary | ICD-10-CM

## 2024-10-12 DIAGNOSIS — M5459 Other low back pain: Secondary | ICD-10-CM | POA: Diagnosis not present
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
## 2024-10-14 ENCOUNTER — Encounter: Admitting: Physical Therapy

## 2024-10-19 DIAGNOSIS — M9903 Segmental and somatic dysfunction of lumbar region: Secondary | ICD-10-CM | POA: Diagnosis not present

## 2024-10-22 ENCOUNTER — Ambulatory Visit: Admitting: Physical Therapy

## 2024-10-22 ENCOUNTER — Encounter: Payer: Self-pay | Admitting: Physical Therapy

## 2024-10-22 DIAGNOSIS — M5459 Other low back pain: Secondary | ICD-10-CM | POA: Diagnosis not present

## 2024-10-22 DIAGNOSIS — M542 Cervicalgia: Secondary | ICD-10-CM | POA: Diagnosis not present
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
## 2024-10-26 ENCOUNTER — Encounter: Payer: Self-pay | Admitting: Physical Therapy

## 2024-10-26 ENCOUNTER — Ambulatory Visit: Admitting: Physical Therapy

## 2024-10-26 DIAGNOSIS — M542 Cervicalgia: Secondary | ICD-10-CM | POA: Diagnosis not present

## 2024-10-26 DIAGNOSIS — M5459 Other low back pain: Secondary | ICD-10-CM | POA: Diagnosis not present
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
## 2024-10-29 ENCOUNTER — Encounter: Payer: Self-pay | Admitting: Physical Therapy

## 2024-10-29 ENCOUNTER — Ambulatory Visit: Admitting: Physical Therapy

## 2024-10-29 DIAGNOSIS — M542 Cervicalgia: Secondary | ICD-10-CM | POA: Diagnosis not present

## 2024-10-29 DIAGNOSIS — M5459 Other low back pain: Secondary | ICD-10-CM | POA: Diagnosis not present
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
## 2024-11-18 ENCOUNTER — Ambulatory Visit: Admitting: Family Medicine

## 2024-11-24 ENCOUNTER — Encounter: Payer: Self-pay | Admitting: Family Medicine

## 2024-11-24 ENCOUNTER — Ambulatory Visit (INDEPENDENT_AMBULATORY_CARE_PROVIDER_SITE_OTHER): Admitting: Family Medicine

## 2024-11-24 VITALS — BP 134/78 | HR 88 | Temp 97.7°F | Ht 62.0 in | Wt 185.4 lb

## 2024-11-24 DIAGNOSIS — R7303 Prediabetes: Secondary | ICD-10-CM | POA: Diagnosis not present

## 2024-11-24 DIAGNOSIS — E034 Atrophy of thyroid (acquired): Secondary | ICD-10-CM

## 2024-11-24 DIAGNOSIS — G4733 Obstructive sleep apnea (adult) (pediatric): Secondary | ICD-10-CM

## 2024-11-24 DIAGNOSIS — E7849 Other hyperlipidemia: Secondary | ICD-10-CM

## 2024-11-24 DIAGNOSIS — J069 Acute upper respiratory infection, unspecified: Secondary | ICD-10-CM | POA: Diagnosis not present
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
## 2024-11-24 NOTE — Patient Instructions (Signed)
 Welcome to [facility]! It was a pleasure meeting you today.  As discussed, Please schedule a 6 month follow up visit today.  PLEASE NOTE:  If you had any LAB tests please let us know if you have not heard back within a few days. You may see your results on MyChart before we have a chance to review them but we will give you a call once they are reviewed by [place]. If we ordered any REFERRALS today, please let us know if you have not heard from their office within the next week.  Let us know through MyChart if you are needing REFILLS, or have your pharmacy send [place] the request. You can also use MyChart to communicate with me or any office staff.  Please try these tips to maintain a healthy lifestyle:  Eat most of your calories during the day when you are active. Eliminate processed foods including packaged sweets (pies, cakes, cookies), reduce intake of potatoes, white bread, white pasta, and white rice. Look for whole grain options, oat flour or almond flour.  Each meal should contain half fruits/vegetables, one quarter protein, and one quarter carbs (no bigger than a computer mouse).  Cut down on sweet beverages. This includes juice, soda, and sweet tea. Also watch fruit intake, though this is a healthier sweet option, it still contains natural sugar! Limit to 3 servings daily.  Drink at least 1 glass of water with each meal and aim for at least 8 glasses per day  Exercise at least 150 minutes every week.

## 2024-11-26 ENCOUNTER — Ambulatory Visit: Payer: Self-pay

## 2024-11-26 NOTE — Telephone Encounter (Signed)
 FYI Only or Action Required?: Pt advised to see HCP, no same day appts available - pt advised UC. Pt is agreeable.   Patient was last seen in primary care on 11/24/2024 by Wendolyn Jenkins Jansky, MD.  Called Nurse Triage reporting Otalgia.  Symptoms began several days ago.  Interventions attempted: OTC medications: Tylenol  .  Symptoms are: gradually worsening.  Triage Disposition: See HCP Within 4 Hours (Or PCP Triage)  Patient/caregiver understands and will follow disposition?:   Reason for Disposition  [1] SEVERE pain (e.g., excruciating) and [2] not improved 2 hours after pain medicine (e.g., acetaminophen  or ibuprofen)  Answer Assessment - Initial Assessment Questions Pt calling in with worsening cold symptoms. Symptom onset 11/23/24  - had OV with PCP 11/25/23 - too early to treat symptoms as they were not yet severe enough. Pt has sore throat, severe ear pain, bad cough that hurts in the chest, hard time sleeping, nasal drip (clear). Denies chill and fever or headache. Pt taking tylenol .   Pt advised to see HCP, no same day appts available - pt advised UC. Pt is agreeable.   1. LOCATION: Which ear is involved?     Both  2. ONSET: When did the ear pain start?      11/25/23 3. SEVERITY: How bad is the pain?  (Scale 1-10; mild, moderate or severe)     N/a  4. URI SYMPTOMS: Do you have a runny nose or cough?     Runny nose and cough  5. FEVER: Do you have a fever? If Yes, ask: What is your temperature, how was it measured, and when did it start?     Denies  6. CAUSE: Have you been swimming recently?, How often do you use Q-TIPS?, Have you had any recent air travel or scuba diving?     N/a  7. OTHER SYMPTOMS: Do you have any other symptoms? (e.g., decreased hearing, dizziness, headache, stiff neck, vomiting)     No  Protocols used: Earache-A-AH  Copied from CRM #[id number]. Topic: Clinical - Red Word Triage >> Nov 26, 2024  9:10 AM Allison Bell wrote: Red Word that prompted  transfer to Nurse Triage: chest pain when coughing, ear pain, very sore throat, symptoms not getting better.

## 2024-11-27 ENCOUNTER — Ambulatory Visit

## 2024-11-27 ENCOUNTER — Telehealth: Payer: Self-pay

## 2024-11-27 NOTE — Telephone Encounter (Signed)
" °  Called had to leave message  Copied from CRM [id number]. Topic: Clinical - Medical Advice >> Nov 27, 2024  1:27 PM Allison Bell wrote: Reason for CRM: pt missed a call from [facility]. She stated she went to an Urgent Care and was tested there. She asked for a nurse to call her back for more advice on what she should do to help treat her sxs. "

## 2024-11-27 NOTE — Telephone Encounter (Signed)
Called pt & left message for call back.

## 2024-11-30 ENCOUNTER — Ambulatory Visit

## 2024-12-01 ENCOUNTER — Ambulatory Visit: Payer: Self-pay

## 2024-12-01 ENCOUNTER — Other Ambulatory Visit

## 2024-12-01 NOTE — Telephone Encounter (Signed)
Pt has an appt tom. ?

## 2024-12-01 NOTE — Telephone Encounter (Signed)
" °  FYI Only or Action Required?: FYI only for provider: appointment scheduled on 1/14.  Patient was last seen in primary care on 11/24/2024 by Wendolyn Jenkins Jansky, MD.  Called Nurse Triage reporting Influenza.  Symptoms began a week ago.  Interventions attempted: Rest, hydration, or home remedies.  Symptoms are: unchanged.  Triage Disposition: See Physician Within 24 Hours  Patient/caregiver understands and will follow disposition?: Yes Copied from CRM [id number]. Topic: Clinical - Red Word Triage >> Dec 01, 2024  1:34 PM Wess RAMAN wrote: Red Word that prompted transfer to Nurse Triage: Patient states she is still sick. Ear, chest, throat pain, lack of energy, hurts when coughing. Reason for Disposition  Earache  Answer Assessment - Initial Assessment Questions 1. WORST SYMPTOM: What is your worst symptom? (e.g., cough, runny nose, muscle aches, headache, sore throat, fever)      cough 2. ONSET: When did your flu symptoms start?      A week ago 3. COUGH: How bad is the cough?       ongoing 4. RESPIRATORY DISTRESS: Describe your breathing.      Denies SOB 5. FEVER: Do you have a fever? If Yes, ask: What is your temperature, how was it measured, and when did it start?     denies 6. EXPOSURE: Were you exposed to someone with influenza?        7. FLU VACCINE: Did you get a flu shot this year?      8. HIGH RISK DISEASE: Do you have any chronic medical problems? (e.g., heart or lung disease, asthma, weak immune system, or other HIGH RISK conditions)     no  Protocols used: Influenza (Flu) - Seasonal-A-AH  "

## 2024-12-02 ENCOUNTER — Encounter: Payer: Self-pay | Admitting: Family Medicine

## 2024-12-02 ENCOUNTER — Ambulatory Visit: Admitting: Family Medicine

## 2024-12-02 VITALS — BP 154/82 | HR 88 | Temp 97.9°F | Ht 62.0 in | Wt 186.4 lb

## 2024-12-02 DIAGNOSIS — J40 Bronchitis, not specified as acute or chronic: Secondary | ICD-10-CM | POA: Diagnosis not present

## 2024-12-02 DIAGNOSIS — J101 Influenza due to other identified influenza virus with other respiratory manifestations: Secondary | ICD-10-CM | POA: Diagnosis not present

## 2024-12-02 MED ORDER — AZITHROMYCIN 250 MG PO TABS: ORAL_TABLET | ORAL | 0 refills | Status: AC

## 2024-12-02 NOTE — Progress Notes (Signed)
 "  Subjective:     Patient ID: Allison Bell, female    DOB: 1951/06/11, 74 y.o.   MRN: [id number]  Chief Complaint  Patient presents with   Cough    Pt is not feeling any better    Discussed the use of AI scribe software for clinical note transcription with the patient, who gave verbal consent to proceed. Onset 1/6 History of Present Illness Allison Bell is a 74 year old female who presents with persistent flu-like symptoms and concerns about possible pneumonia.  She has been experiencing flu-like symptoms for approximately eight to nine days, including a severe sore throat, chest pain when coughing, and significant fatigue. Her throat feels as if 'a knife was in it' with each swallow or cough. She has been experiencing sweating and feels very weak, with no significant improvement in energy levels. She has not been monitoring her temperature but notes feeling sweaty.  She initially sought care at an urgent care facility where she was informed that it was too late to start Tamiflu. But told she has flu. She declined Tamiflu due to concerns about side effects and past reactions to medications. She was prescribed cough syrup, which she has been taking at night to aid sleep.  She has a history of a severe COVID-19 infection and states that she has not felt this ill since that time. She has been experiencing congestion and has recently started to bring up phlegm, which she considers a positive development. No shortness of breath.  Her social history includes regular exercise with a trainer prior to her illness, and she has been maintaining some level of activity by walking around the house and doing stretches. She has been undergoing monthly detox treatments, which have occasionally caused sweating.  She has a history of allergic reactions to certain medications, including penicillin and vancomycin, which have caused rash and swelling in the past. She is cautious about taking new  medications due to these past experiences.    Health Maintenance Due  Topic Date Due   Medicare Annual Wellness (AWV)  Never done    Past Medical History:  Diagnosis Date   Back pain    Edema of both lower extremities    Food allergy    Hip pain    Hypertension    per pt, white coat   Hypothyroidism    IBS (irritable bowel syndrome)    Joint pain    Lower back pain    Neck pain    Osteoarthritis    Pre-diabetes    Sleep apnea    mouth guard   Thyroid  disease    Vitamin B 12 deficiency     Past Surgical History:  Procedure Laterality Date   ABDOMINAL HYSTERECTOMY     endometriosis and bso   CESAREAN SECTION     x2   OVARIAN CYST REMOVAL     TONSILLECTOMY      Current Medications[1]  Allergies[2] ROS neg/noncontributory except as noted HPI/below      Objective:     BP (!) 154/82 (Cuff Size: Normal)   Pulse 88   Temp 97.9 F (36.6 C) (Temporal)   Ht 5' 2 (1.575 m)   Wt 186 lb 6 oz (84.5 kg)   SpO2 98%   BMI 34.09 kg/m  Wt Readings from Last 3 Encounters:  12/02/24 186 lb 6 oz (84.5 kg)  11/24/24 185 lb 6 oz (84.1 kg)  09/07/24 189 lb (85.7 kg)    Physical Exam GENERAL:  Well developed, well nourished, no acute distress. HEAD EYES EARS NOSE THROAT: Normocephalic, atraumatic, conjunctiva not injected, sclera nonicteric. Right tympanic membrane gray with good landmarks. Left tympanic membrane slightly retracted, no fluid, not red. Oropharynx clear, moist, without exudates. CARDIAC: Regular rate and rhythm, S1 S2 present, NECK: Supple, no thyromegaly, no lymphadenopathy,  LUNGS: Clear to auscultation bilaterally, no wheezes. EXTREMITIES: No edema. MUSCULOSKELETAL: No gross abnormalities. NEUROLOGICAL: Alert and oriented x3, cranial nerves II through XII intact. PSYCHIATRIC: Normal mood, good eye contact.   Reviewed UC notes    Assessment & Plan:  Bronchitis  Influenza A  Other orders -     Azithromycin ; Take 2 tablets on day 1, then 1  tablet daily on days 2 through 5  Dispense: 6 tablet; Refill: 0    Assessment and Plan Assessment & Plan Influenza A with acute bronchitis   Influenza A with acute bronchitis has persisted for 8-9 days, presenting with sore throat, cough, congestion, and fatigue. Recently, she developed a productive cough and sweating, though there is no fever or shortness of breath. Despite clear lung sounds, pneumonia cannot be ruled out without imaging. She declined Tamiflu due to concerns about side effects and past medication reactions. Given the potential pneumonia risk, start Z-Pak (azithromycin ). Advise rest and increased fluid intake. Instruct her to monitor for worsening symptoms such as increased sweating, shortness of breath, or significant fatigue. She should contact the office if symptoms worsen or go to the ER if they become severe.     Return if symptoms worsen or fail to improve.  Jenkins CHRISTELLA Carrel, MD     [1]  Current Outpatient Medications:    azithromycin  (ZITHROMAX ) 250 MG tablet, Take 2 tablets on day 1, then 1 tablet daily on days 2 through 5, Disp: 6 tablet, Rfl: 0   B Complex Vitamins (B COMPLEX 1 PO), Take by mouth., Disp: , Rfl:    Cholecalciferol (VITAMIN D -3 PO), Take by mouth., Disp: , Rfl:    Coenzyme Q10 (COQ10) 100 MG CAPS, Take by mouth., Disp: , Rfl:    estradiol (ESTRACE) 0.1 MG/GM vaginal cream, Place 1 Applicatorful vaginally at bedtime., Disp: , Rfl:    Magnesium 100 MG CAPS, Take 120 mg by mouth., Disp: , Rfl:    Magnesium Ascorbate POWD, by Does not apply route., Disp: , Rfl:    Misc Natural Products (ADRENAL PO), Take by mouth 2 (two) times daily., Disp: , Rfl:    NON FORMULARY, Thyroid  script 1 capsule daily., Disp: , Rfl:    progesterone (PROMETRIUM) 100 MG capsule, Take 100 mg by mouth daily., Disp: , Rfl:    TURMERIC PO, Take by mouth., Disp: , Rfl:    UBIQUINOL PO, Take 150 mg by mouth., Disp: , Rfl:  [2]  Allergies Allergen Reactions   Epinephrine      REACTION: heart pounds and races   Penicillins     REACTION: swelling and rash   Procaine    Vancomycin     REACTION: head throbbing,heart raced, wave of heat throughout body   "

## 2024-12-02 NOTE — Patient Instructions (Signed)
 Rest.  Plenty of fluids  Zpack sent to pharm.  Worse, let us  know or ER

## 2024-12-04 ENCOUNTER — Ambulatory Visit: Payer: Self-pay

## 2024-12-04 NOTE — Telephone Encounter (Signed)
 Reason for Triage: Patient wanted to know if she is still contagious or how long she would need to stay isolated.  Her symptoms were sore throat, nasal discharge, pain in ear, cough. Somewhat feeling better and it has been 11 days  11/26/24 diagnosed with the flu Widow and live by self and son has been dropping stuff off Thinks it probably been 11 days   Reason for Disposition  Health information question, no triage required and triager able to answer question  Answer Assessment - Initial Assessment Questions Symptoms started on 11/24/24, was evaluated 11/26/24 and 12/02/24. She is a widow and lives by herself and her son has been dropping things off for her on the porch. She wanted to know what isolation recommendations are since it's been 11 days. RN advised 5 days from onset, PLUS no fever without medications and overall symptoms are improving. Pt stated understanding and appreciation.     1. REASON FOR CALL: What is the main reason for your call? or How can I best help you?     Wants to know how long she should be isolating 2. SYMPTOMS : Do you have any symptoms?      Cough, nasal drainage and sore throat 3. OTHER QUESTIONS: Do you have any other questions?     no  Protocols used: Information Only Call - No Triage-A-AH

## 2024-12-04 NOTE — Telephone Encounter (Signed)
 FYI Only or Action Required?: Action required by provider: update on patient condition.  Patient was last seen in primary care on 12/02/2024 by Wendolyn Jenkins Jansky, MD.  Called Nurse Triage reporting Advice Only.  Symptoms began several weeks ago.  Interventions attempted: Other: n/a.  Symptoms are: gradually improving.  Triage Disposition: Information or Advice Only Call  Patient/caregiver understands and will follow disposition?: Yes

## 2024-12-07 ENCOUNTER — Telehealth: Payer: Self-pay

## 2024-12-07 NOTE — Telephone Encounter (Signed)
 Copied from CRM [id number]. Topic: General - Other >> Dec 04, 2024 10:25 AM Alfonso HERO wrote: Reason for CRM: patient is calling to ask the time frame she needs to not be around others and if she can still be contagious.Asking for a call back or mychart message.   Called pt had to leave a message this has been going on about 12 days so she should be good to be around people as long as she has been fever free for at least 24 hours.

## 2024-12-11 ENCOUNTER — Other Ambulatory Visit

## 2024-12-22 ENCOUNTER — Ambulatory Visit

## 2025-01-06 ENCOUNTER — Other Ambulatory Visit

## 2025-01-11 ENCOUNTER — Ambulatory Visit

## 2025-05-25 ENCOUNTER — Encounter: Admitting: Family Medicine

## 2025-05-25 ENCOUNTER — Encounter: Admitting: Physician Assistant
